# Patient Record
Sex: Male | Born: 1950
Health system: Southern US, Community
[De-identification: ages and names within clinical notes are randomized; demographics above are authoritative.]

## PROBLEM LIST (undated history)

## (undated) DIAGNOSIS — I779 Disorder of arteries and arterioles, unspecified: Secondary | ICD-10-CM

## (undated) DIAGNOSIS — E785 Hyperlipidemia, unspecified: Secondary | ICD-10-CM

## (undated) DIAGNOSIS — I2510 Atherosclerotic heart disease of native coronary artery without angina pectoris: Secondary | ICD-10-CM

## (undated) DIAGNOSIS — E119 Type 2 diabetes mellitus without complications: Secondary | ICD-10-CM

## (undated) DIAGNOSIS — K219 Gastro-esophageal reflux disease without esophagitis: Secondary | ICD-10-CM

## (undated) DIAGNOSIS — E669 Obesity, unspecified: Secondary | ICD-10-CM

## (undated) DIAGNOSIS — R55 Syncope and collapse: Secondary | ICD-10-CM

## (undated) DIAGNOSIS — I739 Peripheral vascular disease, unspecified: Secondary | ICD-10-CM

## (undated) DIAGNOSIS — R0789 Other chest pain: Secondary | ICD-10-CM

## (undated) DIAGNOSIS — I10 Essential (primary) hypertension: Secondary | ICD-10-CM

## (undated) HISTORY — PX: CAROTID ENDARTERECTOMY: SUR193

## (undated) HISTORY — PX: CORONARY ANGIOPLASTY WITH STENT PLACEMENT: SHX49

---

## 2012-06-22 DIAGNOSIS — I10 Essential (primary) hypertension: Secondary | ICD-10-CM | POA: Diagnosis present

## 2012-06-22 DIAGNOSIS — E785 Hyperlipidemia, unspecified: Secondary | ICD-10-CM | POA: Insufficient documentation

## 2012-06-22 DIAGNOSIS — I213 ST elevation (STEMI) myocardial infarction of unspecified site: Secondary | ICD-10-CM | POA: Insufficient documentation

## 2013-05-26 ENCOUNTER — Emergency Department (HOSPITAL_BASED_OUTPATIENT_CLINIC_OR_DEPARTMENT_OTHER)
Admission: EM | Admit: 2013-05-26 | Discharge: 2013-05-26 | Disposition: A | Discharge status: Home / Self Care | Payer: Non-veteran care | Attending: Emergency Medicine | Admitting: Emergency Medicine

## 2013-05-26 ENCOUNTER — Encounter (HOSPITAL_BASED_OUTPATIENT_CLINIC_OR_DEPARTMENT_OTHER): Payer: Self-pay | Admitting: Emergency Medicine

## 2013-05-26 DIAGNOSIS — R531 Weakness: Secondary | ICD-10-CM

## 2013-05-26 DIAGNOSIS — R51 Headache: Secondary | ICD-10-CM | POA: Insufficient documentation

## 2013-05-26 DIAGNOSIS — R5381 Other malaise: Secondary | ICD-10-CM | POA: Insufficient documentation

## 2013-05-26 DIAGNOSIS — Z7902 Long term (current) use of antithrombotics/antiplatelets: Secondary | ICD-10-CM | POA: Insufficient documentation

## 2013-05-26 DIAGNOSIS — Z9861 Coronary angioplasty status: Secondary | ICD-10-CM | POA: Insufficient documentation

## 2013-05-26 DIAGNOSIS — Z87891 Personal history of nicotine dependence: Secondary | ICD-10-CM | POA: Insufficient documentation

## 2013-05-26 DIAGNOSIS — I252 Old myocardial infarction: Secondary | ICD-10-CM | POA: Insufficient documentation

## 2013-05-26 DIAGNOSIS — Z79899 Other long term (current) drug therapy: Secondary | ICD-10-CM | POA: Insufficient documentation

## 2013-05-26 DIAGNOSIS — Z880 Allergy status to penicillin: Secondary | ICD-10-CM | POA: Insufficient documentation

## 2013-05-26 HISTORY — DX: Essential (primary) hypertension: I10

## 2013-05-26 LAB — CBC WITH DIFFERENTIAL/PLATELET
Basophils Relative: 1 % (ref 0–1)
Eosinophils Absolute: 0.5 10*3/uL (ref 0.0–0.7)
HCT: 44 % (ref 39.0–52.0)
Hemoglobin: 15 g/dL (ref 13.0–17.0)
Lymphocytes Relative: 29 % (ref 12–46)
MCHC: 34.1 g/dL (ref 30.0–36.0)
Monocytes Relative: 7 % (ref 3–12)
Neutro Abs: 4.4 10*3/uL (ref 1.7–7.7)
Neutrophils Relative %: 57 % (ref 43–77)
Platelets: 229 10*3/uL (ref 150–400)
RBC: 4.89 MIL/uL (ref 4.22–5.81)

## 2013-05-26 LAB — BASIC METABOLIC PANEL
BUN: 18 mg/dL (ref 6–23)
Calcium: 9.6 mg/dL (ref 8.4–10.5)
Chloride: 101 mEq/L (ref 96–112)
GFR calc Af Amer: >90 mL/min (ref >90)
GFR calc non Af Amer: 78 mL/min — ABNORMAL LOW (ref >90)
Potassium: 4.3 mEq/L (ref 3.5–5.1)
Sodium: 140 mEq/L (ref 135–145)

## 2013-05-26 NOTE — ED Provider Notes (Addendum)
CSN: [id number]     Arrival date & time 05/26/13  1842 History  This chart was scribed for Doug Sou, MD by Blanchard Kelch, ED Scribe. The patient was seen in room MH05/MH05. Patient's care was started at 8:48 PM.      Chief Complaint  Patient presents with  . Hypertension   ) Patient is a 62 y.o. male presenting with hypertension. The history is provided by the patient. No language interpreter was used.  Hypertension This is a new problem. The current episode started 3 to 5 hours ago. The problem occurs constantly. The problem has been gradually improving. Associated symptoms include headaches. Pertinent negatives include no chest pain and no shortness of breath. The symptoms are relieved by ASA. He has tried ASA for the symptoms. The treatment provided significant relief.    HPI Comments: Demitrus Francisco is a 62 y.o. male who presents to the Emergency Department complaining of light-headedneess four hours ago. The sensation lasted about an hour. He had a mild headache that has subsided. He checked his blood pressure after he felt the sensation and it was 170/70, then five minutes later it was 196/96, then the third time twenty minutes later was 175/72.  He reports taking an aspirin with relief of symptoms. He had a heart catheterization a year ago due to an MI and had two stents placed. He was told by his surgeon to come to ED for any symptoms. He denies shortness of breath,   chest pain. No visual changes no abdominal pain he is presently asymptomatic and feels well He is currently on Metoprolol and Lisinopril. He is scheduled for a stress test in January but has been doing well according to his cardiologist who he saw a month and a half ago. He is a former smoker, quit a year ago. He denies drinking alcohol for six months or any illegal drug use.   Past Medical History  Diagnosis Date  . Hypertension    Past Surgical History  Procedure Laterality Date  . Coronary angioplasty with stent  placement    . Carotid endarterectomy     No family history on file. History  Substance Use Topics  . Smoking status: Former Games developer  . Smokeless tobacco: Not on file  . Alcohol Use: No    Review of Systems  Constitutional: Negative.   Respiratory: Negative.  Negative for shortness of breath.   Cardiovascular: Negative.  Negative for chest pain.  Gastrointestinal: Negative.   Musculoskeletal: Negative.   Skin: Negative.   Neurological: Positive for light-headedness and headaches.  Psychiatric/Behavioral: Negative.   All other systems reviewed and are negative.    Allergies  Penicillins  Home Medications   Current Outpatient Rx  Name  Route  Sig  Dispense  Refill  . CALCIUM CITRATE PO   Oral   Take by mouth.         . Clopidogrel Bisulfate (PLAVIX PO)   Oral   Take by mouth.         . Esomeprazole Magnesium (NEXIUM PO)   Oral   Take by mouth.         Marland Kitchen LISINOPRIL PO   Oral   Take by mouth.         . METOPROLOL TARTRATE PO   Oral   Take by mouth.          Triage Vitals: BP 155/73  Pulse 75  Temp(Src) 98.6 F (37 C) (Oral)  Resp 20  Ht 5\' 9"  (1.753 m)  Wt 209 lb (94.802 kg)  BMI 30.85 kg/m2  SpO2 100%  Physical Exam  Nursing note and vitals reviewed. Constitutional: He is oriented to person, place, and time. He appears well-developed and well-nourished.  HENT:  Head: Normocephalic and atraumatic.  Eyes: Conjunctivae are normal. Pupils are equal, round, and reactive to light.  Neck: Neck supple. No tracheal deviation present. No thyromegaly present.  Cardiovascular: Normal rate and regular rhythm.   No murmur heard. Pulmonary/Chest: Effort normal and breath sounds normal.  Abdominal: Soft. Bowel sounds are normal. He exhibits no distension. There is no tenderness.  Musculoskeletal: Normal range of motion. He exhibits no edema and no tenderness.  Neurological: He is alert and oriented to person, place, and time. He has normal reflexes. No  cranial nerve deficit. Coordination normal.  Skin: Skin is warm and dry. No rash noted.  Psychiatric: He has a normal mood and affect.    ED Course  Procedures (including critical care time)  DIAGNOSTIC STUDIES: Oxygen Saturation is 100% on room air, normal by my interpretation.    COORDINATION OF CARE: 9:05 PM -Will order BMP, CBC. Patient verbalizes understanding and agrees with treatment plan.    Labs Review Labs Reviewed  BASIC METABOLIC PANEL - Abnormal; Notable for the following:    Glucose, Bld 114 (*)    GFR calc non Af Amer 78 (*)    All other components within normal limits  CBC WITH DIFFERENTIAL - Abnormal; Notable for the following:    Eosinophils Relative 6 (*)    All other components within normal limits   Imaging Review No results found.  EKG Interpretation   None      10:05 PM patient asymptomatic Results for orders placed during the hospital encounter of 05/26/13  BASIC METABOLIC PANEL      Result Value Range   Sodium 140  135 - 145 mEq/L   Potassium 4.3  3.5 - 5.1 mEq/L   Chloride 101  96 - 112 mEq/L   CO2 29  19 - 32 mEq/L   Glucose, Bld 114 (*) 70 - 99 mg/dL   BUN 18  6 - 23 mg/dL   Creatinine, Ser 1.61  0.50 - 1.35 mg/dL   Calcium 9.6  8.4 - 09.6 mg/dL   GFR calc non Af Amer 78 (*) >90 mL/min   GFR calc Af Amer >90  >90 mL/min  CBC WITH DIFFERENTIAL      Result Value Range   WBC 7.7  4.0 - 10.5 K/uL   RBC 4.89  4.22 - 5.81 MIL/uL   Hemoglobin 15.0  13.0 - 17.0 g/dL   HCT 04.5  40.9 - 81.1 %   MCV 90.0  78.0 - 100.0 fL   MCH 30.7  26.0 - 34.0 pg   MCHC 34.1  30.0 - 36.0 g/dL   RDW 91.4  78.2 - 95.6 %   Platelets 229  150 - 400 K/uL   Neutrophils Relative % 57  43 - 77 %   Neutro Abs 4.4  1.7 - 7.7 K/uL   Lymphocytes Relative 29  12 - 46 %   Lymphs Abs 2.2  0.7 - 4.0 K/uL   Monocytes Relative 7  3 - 12 %   Monocytes Absolute 0.5  0.1 - 1.0 K/uL   Eosinophils Relative 6 (*) 0 - 5 %   Eosinophils Absolute 0.5  0.0 - 0.7 K/uL    Basophils Relative 1  0 - 1 %   Basophils Absolute 0.1  0.0 -  0.1 K/uL   No results found.  Date: 05/26/2013  Rate: 75  Rhythm: normal sinus rhythm  QRS Axis: normal  Intervals: normal  ST/T Wave abnormalities: normal  Conduction Disutrbances: none  Narrative Interpretation: unremarkable    MDM  No diagnosis found. Plan followup with primary care physician Diagnosis #1 weakness 2 hypertension   I personally performed the services described in this documentation, which was scribed in my presence. The recorded information has been reviewed and considered.   Doug Sou, MD 05/26/13 1610  Doug Sou, MD 05/26/13 [phone]

## 2013-05-26 NOTE — ED Notes (Addendum)
Was feeling light head, ,  Checked bp  Was elvelated  So came to ed   States feels fine now denies being light headed

## 2013-05-26 NOTE — ED Notes (Signed)
C/o HTN, lightheaded, dizzy-started approx 1730

## 2014-06-05 ENCOUNTER — Emergency Department (HOSPITAL_BASED_OUTPATIENT_CLINIC_OR_DEPARTMENT_OTHER): Payer: Non-veteran care

## 2014-06-05 ENCOUNTER — Encounter (HOSPITAL_BASED_OUTPATIENT_CLINIC_OR_DEPARTMENT_OTHER): Payer: Self-pay | Admitting: Emergency Medicine

## 2014-06-05 ENCOUNTER — Inpatient Hospital Stay (HOSPITAL_BASED_OUTPATIENT_CLINIC_OR_DEPARTMENT_OTHER)
Admission: EM | Admit: 2014-06-05 | Discharge: 2014-06-09 | DRG: 247 | Disposition: A | Discharge status: Home / Self Care | Payer: Non-veteran care | Attending: Internal Medicine | Admitting: Internal Medicine

## 2014-06-05 DIAGNOSIS — I252 Old myocardial infarction: Secondary | ICD-10-CM

## 2014-06-05 DIAGNOSIS — Z7902 Long term (current) use of antithrombotics/antiplatelets: Secondary | ICD-10-CM

## 2014-06-05 DIAGNOSIS — R079 Chest pain, unspecified: Secondary | ICD-10-CM | POA: Diagnosis present

## 2014-06-05 DIAGNOSIS — T82857A Stenosis of cardiac prosthetic devices, implants and grafts, initial encounter: Secondary | ICD-10-CM | POA: Diagnosis present

## 2014-06-05 DIAGNOSIS — I10 Essential (primary) hypertension: Secondary | ICD-10-CM | POA: Diagnosis present

## 2014-06-05 DIAGNOSIS — E785 Hyperlipidemia, unspecified: Secondary | ICD-10-CM | POA: Diagnosis present

## 2014-06-05 DIAGNOSIS — Z955 Presence of coronary angioplasty implant and graft: Secondary | ICD-10-CM

## 2014-06-05 DIAGNOSIS — E669 Obesity, unspecified: Secondary | ICD-10-CM | POA: Diagnosis present

## 2014-06-05 DIAGNOSIS — I25119 Atherosclerotic heart disease of native coronary artery with unspecified angina pectoris: Secondary | ICD-10-CM

## 2014-06-05 DIAGNOSIS — R0789 Other chest pain: Secondary | ICD-10-CM

## 2014-06-05 DIAGNOSIS — I2510 Atherosclerotic heart disease of native coronary artery without angina pectoris: Principal | ICD-10-CM | POA: Diagnosis present

## 2014-06-05 DIAGNOSIS — Y848 Other medical procedures as the cause of abnormal reaction of the patient, or of later complication, without mention of misadventure at the time of the procedure: Secondary | ICD-10-CM | POA: Diagnosis present

## 2014-06-05 DIAGNOSIS — K219 Gastro-esophageal reflux disease without esophagitis: Secondary | ICD-10-CM | POA: Diagnosis present

## 2014-06-05 DIAGNOSIS — Z7982 Long term (current) use of aspirin: Secondary | ICD-10-CM

## 2014-06-05 DIAGNOSIS — Z87891 Personal history of nicotine dependence: Secondary | ICD-10-CM

## 2014-06-05 DIAGNOSIS — Z79899 Other long term (current) drug therapy: Secondary | ICD-10-CM

## 2014-06-05 HISTORY — DX: Atherosclerotic heart disease of native coronary artery without angina pectoris: I25.10

## 2014-06-05 HISTORY — DX: Gastro-esophageal reflux disease without esophagitis: K21.9

## 2014-06-05 HISTORY — DX: Obesity, unspecified: E66.9

## 2014-06-05 MED ORDER — ASPIRIN 81 MG PO CHEW
324.0000 mg | CHEWABLE_TABLET | Freq: Once | ORAL | Status: DC
  Filled 2014-06-05: qty 4

## 2014-06-05 NOTE — ED Provider Notes (Signed)
CSN: [id number]     Arrival date & time 06/05/14  2229 History  This chart was scribed for Julianne Rice, MD by Jeanell Sparrow, ED Scribe. This patient was seen in room MH11/MH11 and the patient's care was started at 11:15 PM.   Chief Complaint  Patient presents with  . Chest Pain   The history is provided by the patient. No language interpreter was used.   HPI Comments: Reginald Little is a 63 y.o. male who presents to the Emergency Department complaining of moderate intermittent chest pain that started a few days ago. He reports that his heart stents "may be acting up", but his blood pressure has been normal. He states that about an hour ago, he started feeling lightheaded, feverish, and had a headache. He reports that he measured his blood pressure and it was 192/92 and came to the ED. Pt's blood pressure in the ED is 203/98. He reports no modifying factors for the pain. He denies any SOB, nausea, or leg swelling.   Past Medical History  Diagnosis Date  . Hypertension   . Obesity   . High cholesterol   . Coronary artery disease   . GERD (gastroesophageal reflux disease)    Past Surgical History  Procedure Laterality Date  . Coronary angioplasty with stent placement    . Carotid endarterectomy     No family history on file. History  Substance Use Topics  . Smoking status: Former Smoker    Quit date: 06/05/2012  . Smokeless tobacco: Not on file  . Alcohol Use: No    Review of Systems  Cardiovascular: Positive for chest pain.  Gastrointestinal: Positive for nausea.  All other systems reviewed and are negative.   Allergies  Penicillins  Home Medications   Prior to Admission medications   Medication Sig Start Date End Date Taking? Authorizing Provider  atenolol (TENORMIN) 50 MG tablet Take 50 mg by mouth daily.   Yes Historical Provider, MD  losartan (COZAAR) 50 MG tablet Take 50 mg by mouth daily.   Yes Historical Provider, MD  CALCIUM CITRATE PO Take by mouth.     Historical Provider, MD  Clopidogrel Bisulfate (PLAVIX PO) Take by mouth.    Historical Provider, MD  Esomeprazole Magnesium (NEXIUM PO) Take by mouth.    Historical Provider, MD  LISINOPRIL PO Take by mouth.    Historical Provider, MD  METOPROLOL TARTRATE PO Take by mouth.    Historical Provider, MD   BP 203/98 mmHg  Pulse 84  Temp(Src) 98.2 F (36.8 C) (Oral)  Resp 16  Ht 5\' 8"  (1.727 m)  Wt 206 lb (93.441 kg)  BMI 31.33 kg/m2  SpO2 100% Physical Exam  Constitutional: He is oriented to person, place, and time. He appears well-developed and well-nourished. No distress.  HENT:  Head: Normocephalic and atraumatic.  Neck: Neck supple. No tracheal deviation present.  Cardiovascular: Normal rate.   Pulmonary/Chest: Effort normal. No respiratory distress.  Musculoskeletal: Normal range of motion.  Neurological: He is alert and oriented to person, place, and time.  Skin: Skin is warm and dry.  Psychiatric: He has a normal mood and affect. His behavior is normal.  Nursing note and vitals reviewed.   ED Course  Procedures (including critical care time) DIAGNOSTIC STUDIES: Oxygen Saturation is 100% on RA, normal by my interpretation.    COORDINATION OF CARE: 11:19 PM- Pt advised of plan for treatment and pt agrees.  Labs Review Labs Reviewed  CBC WITH DIFFERENTIAL - Abnormal; Notable for the  following:    Eosinophils Relative 7 (*)    All other components within normal limits  COMPREHENSIVE METABOLIC PANEL - Abnormal; Notable for the following:    Glucose, Bld 134 (*)    GFR calc non Af Amer 86 (*)    All other components within normal limits  TROPONIN I    Imaging Review Dg Chest 2 View  06/05/2014   CLINICAL DATA:  Moderate intermittent chest pain.  EXAM: CHEST  2 VIEW  COMPARISON:  None.  FINDINGS: The heart size and mediastinal contours are within normal limits. Both lungs are clear. The visualized skeletal structures are unremarkable.  IMPRESSION: No acute  cardiopulmonary process.   Electronically Signed   By: Suzy Bouchard M.D.   On: 06/05/2014 23:36     EKG Interpretation None      Date: 06/06/2014  Rate: 84  Rhythm: normal sinus rhythm  QRS Axis: normal  Intervals: normal  ST/T Wave abnormalities: nonspecific T wave changes  Conduction Disutrbances:none  Narrative Interpretation:   Old EKG Reviewed: unchanged   MDM   Final diagnoses:  Chest pain    I personally performed the services described in this documentation, which was scribed in my presence. The recorded information has been reviewed and is accurate.  Patient remained chest pain-free in the emergency department. Initial troponin is normal. Discussed with Dr. Posey Pronto. Will accept in transfer to a telemetry observation bed for rule out.    Julianne Rice, MD 06/06/14 [phone]

## 2014-06-05 NOTE — ED Notes (Signed)
Intermittent left sided CP since yesterday without radiation. Nausea. Elevated bp - has checked at home. Pt has ntg but "never think to take it". Pt unable to qualify his cp.  Sts it "doesn't really hurt, I just know its there".

## 2014-06-06 ENCOUNTER — Encounter (HOSPITAL_COMMUNITY): Payer: Self-pay | Admitting: Internal Medicine

## 2014-06-06 DIAGNOSIS — R079 Chest pain, unspecified: Secondary | ICD-10-CM | POA: Diagnosis not present

## 2014-06-06 DIAGNOSIS — I517 Cardiomegaly: Secondary | ICD-10-CM

## 2014-06-06 DIAGNOSIS — I2510 Atherosclerotic heart disease of native coronary artery without angina pectoris: Secondary | ICD-10-CM | POA: Diagnosis not present

## 2014-06-06 DIAGNOSIS — I209 Angina pectoris, unspecified: Secondary | ICD-10-CM | POA: Diagnosis not present

## 2014-06-06 LAB — CBC WITH DIFFERENTIAL/PLATELET
BASOS ABS: 0.1 10*3/uL (ref 0.0–0.1)
BASOS PCT: 1 % (ref 0–1)
Basophils Absolute: 0.1 10*3/uL (ref 0.0–0.1)
Basophils Relative: 1 % (ref 0–1)
EOS PCT: 7 % — AB (ref 0–5)
Eosinophils Absolute: 0.4 10*3/uL (ref 0.0–0.7)
Eosinophils Absolute: 0.4 10*3/uL (ref 0.0–0.7)
Eosinophils Relative: 7 % — ABNORMAL HIGH (ref 0–5)
HEMATOCRIT: 41.8 % (ref 39.0–52.0)
HEMATOCRIT: 42.4 % (ref 39.0–52.0)
Hemoglobin: 13.9 g/dL (ref 13.0–17.0)
Hemoglobin: 14.4 g/dL (ref 13.0–17.0)
LYMPHS ABS: 2.2 10*3/uL (ref 0.7–4.0)
Lymphocytes Relative: 33 % (ref 12–46)
Lymphocytes Relative: 36 % (ref 12–46)
Lymphs Abs: 1.9 10*3/uL (ref 0.7–4.0)
MCH: 29.8 pg (ref 26.0–34.0)
MCH: 30.9 pg (ref 26.0–34.0)
MCHC: 33.3 g/dL (ref 30.0–36.0)
MCHC: 34 g/dL (ref 30.0–36.0)
MCV: 89.7 fL (ref 78.0–100.0)
MCV: 91 fL (ref 78.0–100.0)
MONO ABS: 0.4 10*3/uL (ref 0.1–1.0)
MONO ABS: 0.6 10*3/uL (ref 0.1–1.0)
Monocytes Relative: 10 % (ref 3–12)
Monocytes Relative: 7 % (ref 3–12)
Neutro Abs: 2.9 10*3/uL (ref 1.7–7.7)
Neutro Abs: 3 10*3/uL (ref 1.7–7.7)
Neutrophils Relative %: 49 % (ref 43–77)
Neutrophils Relative %: 49 % (ref 43–77)
Platelets: 193 10*3/uL (ref 150–400)
Platelets: 205 10*3/uL (ref 150–400)
RBC: 4.66 MIL/uL (ref 4.22–5.81)
RBC: 4.66 MIL/uL (ref 4.22–5.81)
RDW: 12.7 % (ref 11.5–15.5)
RDW: 12.5 % (ref 11.5–15.5)
WBC: 5.9 10*3/uL (ref 4.0–10.5)
WBC: 6.2 10*3/uL (ref 4.0–10.5)

## 2014-06-06 LAB — TROPONIN I
Troponin I: <0.03 ng/mL (ref <0.031)
Troponin I: <0.03 ng/mL (ref <0.031)
Troponin I: <0.03 ng/mL (ref <0.031)

## 2014-06-06 LAB — COMPREHENSIVE METABOLIC PANEL
ALBUMIN: 4.1 g/dL (ref 3.5–5.2)
ALT: 48 U/L (ref 0–53)
ALT: 53 U/L (ref 0–53)
ANION GAP: 6 (ref 5–15)
AST: 29 U/L (ref 0–37)
AST: 29 U/L (ref 0–37)
Albumin: 3.5 g/dL (ref 3.5–5.2)
Alkaline Phosphatase: 69 U/L (ref 39–117)
Alkaline Phosphatase: 79 U/L (ref 39–117)
Anion gap: 6 (ref 5–15)
BUN: 21 mg/dL (ref 6–23)
BUN: 23 mg/dL (ref 6–23)
CALCIUM: 9 mg/dL (ref 8.4–10.5)
CO2: 28 mmol/L (ref 19–32)
CO2: 30 mmol/L (ref 19–32)
CREATININE: 0.9 mg/dL (ref 0.50–1.35)
CREATININE: 0.95 mg/dL (ref 0.50–1.35)
Calcium: 9.3 mg/dL (ref 8.4–10.5)
Chloride: 103 mEq/L (ref 96–112)
Chloride: 105 mEq/L (ref 96–112)
GFR calc Af Amer: >90 mL/min (ref >90)
GFR calc Af Amer: >90 mL/min (ref >90)
GFR calc non Af Amer: 86 mL/min — ABNORMAL LOW (ref >90)
GFR, EST NON AFRICAN AMERICAN: 88 mL/min — AB (ref >90)
Glucose, Bld: 134 mg/dL — ABNORMAL HIGH (ref 70–99)
Glucose, Bld: 134 mg/dL — ABNORMAL HIGH (ref 70–99)
Potassium: 3.9 mmol/L (ref 3.5–5.1)
Potassium: 4.3 mmol/L (ref 3.5–5.1)
Sodium: 139 mmol/L (ref 135–145)
Sodium: 139 mmol/L (ref 135–145)
TOTAL PROTEIN: 7.2 g/dL (ref 6.0–8.3)
Total Bilirubin: 0.7 mg/dL (ref 0.3–1.2)
Total Bilirubin: 0.8 mg/dL (ref 0.3–1.2)
Total Protein: 6.6 g/dL (ref 6.0–8.3)

## 2014-06-06 LAB — PROTIME-INR
INR: 1.04 (ref 0.00–1.49)
PROTHROMBIN TIME: 13.7 s (ref 11.6–15.2)

## 2014-06-06 MED ORDER — ACETAMINOPHEN 325 MG PO TABS: 650.0000 mg | ORAL_TABLET | ORAL | Status: DC | PRN

## 2014-06-06 MED ORDER — ONDANSETRON HCL 4 MG/2ML IJ SOLN: 4.0000 mg | Freq: Four times a day (QID) | INTRAMUSCULAR | Status: DC | PRN

## 2014-06-06 MED ORDER — ATENOLOL 50 MG PO TABS
50.0000 mg | ORAL_TABLET | Freq: Every day | ORAL | Status: DC
  Administered 2014-06-06 – 2014-06-09 (×4): 50 mg via ORAL
  Filled 2014-06-06: qty 2
  Filled 2014-06-06: qty 1
  Filled 2014-06-06 (×2): qty 2

## 2014-06-06 MED ORDER — ASPIRIN EC 81 MG PO TBEC
81.0000 mg | DELAYED_RELEASE_TABLET | Freq: Every day | ORAL | Status: DC
  Administered 2014-06-06 – 2014-06-07 (×2): 81 mg via ORAL
  Filled 2014-06-06 (×2): qty 1

## 2014-06-06 MED ORDER — ATORVASTATIN CALCIUM 20 MG PO TABS
20.0000 mg | ORAL_TABLET | Freq: Every day | ORAL | Status: DC
  Administered 2014-06-06 – 2014-06-08 (×3): 20 mg via ORAL
  Filled 2014-06-06 (×4): qty 1

## 2014-06-06 MED ORDER — LOSARTAN POTASSIUM 50 MG PO TABS
50.0000 mg | ORAL_TABLET | Freq: Every day | ORAL | Status: DC
  Administered 2014-06-06 – 2014-06-09 (×4): 50 mg via ORAL
  Filled 2014-06-06 (×4): qty 1

## 2014-06-06 MED ORDER — PANTOPRAZOLE SODIUM 40 MG PO TBEC
40.0000 mg | DELAYED_RELEASE_TABLET | Freq: Every day | ORAL | Status: DC
  Administered 2014-06-06 – 2014-06-09 (×4): 40 mg via ORAL
  Filled 2014-06-06 (×4): qty 1

## 2014-06-06 MED ORDER — POTASSIUM CHLORIDE CRYS ER 20 MEQ PO TBCR
20.0000 meq | EXTENDED_RELEASE_TABLET | Freq: Once | ORAL | Status: AC
  Administered 2014-06-06: 20 meq via ORAL
  Filled 2014-06-06: qty 2

## 2014-06-06 MED ORDER — CLOPIDOGREL BISULFATE 75 MG PO TABS
75.0000 mg | ORAL_TABLET | Freq: Every day | ORAL | Status: DC
  Administered 2014-06-06 – 2014-06-09 (×4): 75 mg via ORAL
  Filled 2014-06-06 (×4): qty 1

## 2014-06-06 MED ORDER — CO-ENZYME Q-10 50 MG PO CAPS: 100.0000 mg | ORAL_CAPSULE | Freq: Every day | ORAL | Status: DC

## 2014-06-06 MED ORDER — ENOXAPARIN SODIUM 40 MG/0.4ML ~~LOC~~ SOLN
40.0000 mg | Freq: Every day | SUBCUTANEOUS | Status: DC
  Administered 2014-06-06 – 2014-06-09 (×3): 40 mg via SUBCUTANEOUS
  Filled 2014-06-06 (×3): qty 0.4

## 2014-06-06 NOTE — Consult Note (Signed)
Reason for Consult: Chest Pain  Requesting Physician: Dr Waldron Labs  HPI: Mr. Reginald Little is a 63 year old mildly overweight Caucasian male with no children who works in the Engineer, mining and was admitted for chest pain/rule out myocardial infarction. He has a history of CAD status post stenting with 2 drug-eluting stents 2 years ago at [facility]. His risk factors include discontinued tobacco abuse at that time, due to hypertension and hyperlipidemia. He admits to dietary indiscretion last night having had him and came in hypertensive for some atypical chest pain. His enzymes were negative. His EKG showed no acute changes. There was a Q wave in lead 3. He is currently pain-free.   Problem List: Patient Active Problem List   Diagnosis Date Noted  . Chest pain 06/06/2014  . CAD (coronary artery disease) 06/06/2014    PMHx:  Past Medical History  Diagnosis Date  . Hypertension   . Obesity   . High cholesterol   . Coronary artery disease   . GERD (gastroesophageal reflux disease)   . H/O carotid endarterectomy    Past Surgical History  Procedure Laterality Date  . Coronary angioplasty with stent placement    . Carotid endarterectomy      FAMHx: No family history on file.  SOCHx:  reports that he quit smoking about 2 years ago. He does not have any smokeless tobacco history on file. He reports that he does not drink alcohol or use illicit drugs.  ALLERGIES: Allergies  Allergen Reactions  . Penicillins Rash    ROS: Pertinent items are noted in HPI.  HOME MEDICATIONS: Prescriptions prior to admission  Medication Sig Dispense Refill Last Dose  . aspirin EC 81 MG tablet Take 81 mg by mouth daily.     Marland Kitchen atenolol (TENORMIN) 50 MG tablet Take 50 mg by mouth daily.     Marland Kitchen co-enzyme Q-10 50 MG capsule Take 300 mg by mouth daily.     Marland Kitchen losartan (COZAAR) 50 MG tablet Take 50 mg by mouth daily.     Marland Kitchen CALCIUM CITRATE PO Take by mouth.     .  Clopidogrel Bisulfate (PLAVIX PO) Take by mouth.     . Esomeprazole Magnesium (NEXIUM PO) Take by mouth.     Marland Kitchen LISINOPRIL PO Take by mouth.     . METOPROLOL TARTRATE PO Take by mouth.       HOSPITAL MEDICATIONS: I have reviewed the patient's current medications.  VITALS: Blood pressure 140/96, pulse 77, temperature 98.2 F (36.8 C), temperature source Oral, resp. rate 20, height 5\' 8"  (1.727 m), weight 212 lb 14.4 oz (96.571 kg), SpO2 100 %.  INPUT/OUTPUT I/O last 3 completed shifts: In: -  Out: 400 [Urine:400]      PHYSICAL EXAM: General appearance: alert and no distress Neck: no adenopathy, no carotid bruit, no JVD, supple, symmetrical, trachea midline and thyroid not enlarged, symmetric, no tenderness/mass/nodules Lungs: clear to auscultation bilaterally Heart: regular rate and rhythm, S1, S2 normal, no murmur, click, rub or gallop Extremities: extremities normal, atraumatic, no cyanosis or edema  LABS:  BMP  Recent Labs  06/05/14 2359 06/06/14 0802  NA 139 139  K 4.3 3.9  CL 103 105  CO2 30 28  GLUCOSE 134* 134*  BUN 23 21  CREATININE 0.95 0.90  CALCIUM 9.3 9.0  GFRNONAA 86* 88*  GFRAA >90 >90    CBC  Recent Labs Lab 06/06/14 0802  WBC 6.2  RBC 4.66  HGB 13.9  HCT 41.8  PLT 205  MCV 89.7    HEMOGLOBIN A1C No results found for: HGBA1C, MPG  Cardiac Panel (last 3 results)  Recent Labs  06/05/14 2359 06/06/14 0802  TROPONINI <0.03 <0.03    BNP (last 3 results) No results for input(s): PROBNP in the last 8760 hours.  TSH No results for input(s): TSH in the last 8760 hours.  CHOLESTEROL No results for input(s): CHOL in the last 8760 hours.  Hepatic Function Panel  Recent Labs  06/05/14 2359 06/06/14 0802  PROT 7.2 6.6  ALBUMIN 4.1 3.5  AST 29 29  ALT 53 48  ALKPHOS 79 69  BILITOT 0.8 0.7    IMAGING: Dg Chest 2 View  06/05/2014   CLINICAL DATA:  Moderate intermittent chest pain.  EXAM: CHEST  2 VIEW  COMPARISON:  None.   FINDINGS: The heart size and mediastinal contours are within normal limits. Both lungs are clear. The visualized skeletal structures are unremarkable.  IMPRESSION: No acute cardiopulmonary process.   Electronically Signed   By: Suzy Bouchard M.D.   On: 06/05/2014 23:36   Tele: NSR   IMPRESSION: 1. Coronary artery disease-history of CAD status post stenting 2 years ago at [facility] with 2 drug eluting stents. The pain sounds atypical. Enzymes are negative. There are no acute EKG changes. 2. Hypertension-can titrate his ARB 3. Hyperlipidemia-start statin therapy   RECOMMENDATION: 1. We will obtain a Lexiscan Myoview stress test tomorrow morning to risk stratify him. If negative he can be discharged home after that.  Time Spent Directly with Patient: 30 minutes  Reginald Little J 06/06/2014, 12:31 PM

## 2014-06-06 NOTE — Progress Notes (Signed)
Patient Demographics  Reginald Little, is a 63 y.o. male, DOB - 04/04/1951, ECX:[id number]  Admit date - 06/05/2014   Admitting Physician Berle Mull, MD  Outpatient Primary MD for the patient is Pcp Not In System  LOS - 1   Chief Complaint  Patient presents with  . Chest Pain      Admission history of present illness/brief narrative: Reginald Little is a 63 y.o. male with Past medical history of coronary artery disease status post PCI to RCA with 2 stents in January 14 after a STEMI, carotid endarterectomy. Presents with complaints of left-sided chest pain which was sharp lasting for a few seconds and occurring on exertion and resolving on its own. This has been ongoing for last to 3 days with increasing frequency. He occasionally has shortness of breath with them. Most recent chest pain was accompanied with elevated systolic blood pressure in 190s ,therefore he decided to come to the hospital. Cardiac enzymes were negative 3, has been seen by cardiology, plan is for nuclear stress test in a.m..  Subjective:   Reginald Little today has, No headache, No chest pain, No abdominal pain - No Nausea, No new weakness tingling or numbness, No Cough - SOB.   Assessment & Plan    Principal Problem:   Chest pain Active Problems:   CAD (coronary artery disease)  Chest pain - Cardiology consult appreciated, continue to monitor on telemetry, so far cardiac enzymes negative 3, plan is for Myoview stress test in a.m. to risk stratify patient.  Hypertension -Continue with atenolol, losartan( will hold on titrating losartan given most recent blood pressure 107/53)  Coronary artery disease -Continue with aspirin, beta blockers, Plavix, losartan  Hyperlipidemia -Start on Lipitor 20 mg oral at bedtime  Code Status: Full  Family Communication: None at bedside  Disposition Plan:  Home if nuclear stress test is negative   Procedures None  Consults   Cardiology   Medications  Scheduled Meds: . aspirin  324 mg Oral Once  . aspirin EC  81 mg Oral Daily  . atenolol  50 mg Oral Daily  . clopidogrel  75 mg Oral Daily  . enoxaparin (LOVENOX) injection  40 mg Subcutaneous Daily  . losartan  50 mg Oral Daily  . pantoprazole  40 mg Oral Daily  . potassium chloride  20 mEq Oral Once   Continuous Infusions:  PRN Meds:.acetaminophen, ondansetron (ZOFRAN) IV  DVT Prophylaxis Lovenox  Lab Results  Component Value Date   PLT 205 06/06/2014    Antibiotics   Anti-infectives    None          Objective:   Filed Vitals:   06/06/14 0249 06/06/14 0407 06/06/14 1100 06/06/14 1352  BP:  148/81 140/96 107/53  Pulse: 86 77  61  Temp: 98.1 F (36.7 C) 98.2 F (36.8 C)  98.4 F (36.9 C)  TempSrc: Oral Oral  Oral  Resp: 18 20  17   Height:  5\' 8"  (1.727 m)    Weight:  96.571 kg (212 lb 14.4 oz)    SpO2: 96% 100%  98%    Wt Readings from Last 3 Encounters:  06/06/14 96.571 kg (212 lb 14.4 oz)  05/26/13 94.802 kg (209 lb)     Intake/Output Summary (  Last 24 hours) at 06/06/14 1437 Last data filed at 06/06/14 1345  Gross per 24 hour  Intake    240 ml  Output    400 ml  Net   -160 ml     Physical Exam  Awake Alert, Oriented X 3, No new F.N deficits, Normal affect [place].AT,PERRAL Supple Neck,No JVD, No cervical lymphadenopathy appriciated.  Symmetrical Chest wall movement, Good air movement bilaterally, CTAB RRR,No Gallops,Rubs or new Murmurs, No Parasternal Heave +ve B.Sounds, Abd Soft, No tenderness, No organomegaly appriciated, No rebound - guarding or rigidity. No Cyanosis, Clubbing or edema, No new Rash or bruise     Data Review   Micro Results No results found for this or any previous visit (from the past 240 hour(s)).  Radiology Reports Dg Chest 2 View  06/05/2014   CLINICAL DATA:  Moderate intermittent chest pain.  EXAM: CHEST  2  VIEW  COMPARISON:  None.  FINDINGS: The heart size and mediastinal contours are within normal limits. Both lungs are clear. The visualized skeletal structures are unremarkable.  IMPRESSION: No acute cardiopulmonary process.   Electronically Signed   By: Suzy Bouchard M.D.   On: 06/05/2014 23:36    CBC  Recent Labs Lab 06/05/14 2359 06/06/14 0802  WBC 5.9 6.2  HGB 14.4 13.9  HCT 42.4 41.8  PLT 193 205  MCV 91.0 89.7  MCH 30.9 29.8  MCHC 34.0 33.3  RDW 12.5 12.7  LYMPHSABS 1.9 2.2  MONOABS 0.6 0.4  EOSABS 0.4 0.4  BASOSABS 0.1 0.1    Chemistries   Recent Labs Lab 06/05/14 2359 06/06/14 0802  NA 139 139  K 4.3 3.9  CL 103 105  CO2 30 28  GLUCOSE 134* 134*  BUN 23 21  CREATININE 0.95 0.90  CALCIUM 9.3 9.0  AST 29 29  ALT 53 48  ALKPHOS 79 69  BILITOT 0.8 0.7   ------------------------------------------------------------------------------------------------------------------ estimated creatinine clearance is 93.5 mL/min (by C-G formula based on Cr of 0.9). ------------------------------------------------------------------------------------------------------------------ No results for input(s): HGBA1C in the last 72 hours. ------------------------------------------------------------------------------------------------------------------ No results for input(s): CHOL, HDL, LDLCALC, TRIG, CHOLHDL, LDLDIRECT in the last 72 hours. ------------------------------------------------------------------------------------------------------------------ No results for input(s): TSH, T4TOTAL, T3FREE, THYROIDAB in the last 72 hours.  Invalid input(s): FREET3 ------------------------------------------------------------------------------------------------------------------ No results for input(s): VITAMINB12, FOLATE, FERRITIN, TIBC, IRON, RETICCTPCT in the last 72 hours.  Coagulation profile  Recent Labs Lab 06/06/14 0802  INR 1.04    No results for input(s): DDIMER in the  last 72 hours.  Cardiac Enzymes  Recent Labs Lab 06/05/14 2359 06/06/14 0802 06/06/14 1217  TROPONINI <0.03 <0.03 <0.03   ------------------------------------------------------------------------------------------------------------------ Invalid input(s): POCBNP     Time Spent in minutes   30 minutes   Orlandus Borowski M.D on 06/06/2014 at 2:37 PM  Between 7am to 7pm - Pager - [phone]  After 7pm go to [email or web address] - password [email or web address]  And look for the night coverage person covering for me after hours  [facility] Office  [phone]   **Disclaimer: This note may have been dictated with voice recognition software. Similar sounding words can inadvertently be transcribed and this note may contain transcription errors which may not have been corrected upon publication of note.**

## 2014-06-06 NOTE — H&P (Signed)
[facility] History and Physical  Patient: Reginald Little  URK:[id number]  DOB: Oct 17, 1950  DOS: the patient was seen and examined on 06/06/2014 PCP: Pcp Not In System  Chief Complaint: Chest pain  HPI: Reginald Little is a 63 y.o. male with Past medical history of coronary artery disease status post PCI to RCA with 2 stents in January 14 after a STEMI, carotid endarterectomy. The patient presented with complaints of left-sided chest pain which was sharp lasting for a few seconds and occurring on exertion and resolving on its own. This has been ongoing for last to 3 days with increasing frequency. He occasionally has shortness of breath with them. Last night at 10:30 when he was at home he suddenly started having a sense of dizziness and also started having diaphoresis. Along with that he has chest pain and some shortness of breath which resolved within a few seconds but diaphoresis persisted and he checked his blood pressure and it was elevated in 831D systolic and therefore he decided to come to the urgent care. He does not have any chest pain since his arrival to the hospital. Denies any recent travel mentions he is taking all his medications regularly. Mentions he is trying to lose weight and watch his diet.  The patient is coming from home. And at his baseline independent for most of his ADL.  Review of Systems: as mentioned in the history of present illness.  A Comprehensive review of the other systems is negative.  Past Medical History  Diagnosis Date  . Hypertension   . Obesity   . High cholesterol   . Coronary artery disease   . GERD (gastroesophageal reflux disease)   . H/O carotid endarterectomy    Past Surgical History  Procedure Laterality Date  . Coronary angioplasty with stent placement    . Carotid endarterectomy     Social History:  reports that he quit smoking about 2 years ago. He does not have any smokeless tobacco history on file. He reports that he does not  drink alcohol or use illicit drugs.  Allergies  Allergen Reactions  . Penicillins Rash    No family history on file.  Prior to Admission medications   Medication Sig Start Date End Date Taking? Authorizing Provider  aspirin EC 81 MG tablet Take 81 mg by mouth daily.   Yes Historical Provider, MD  atenolol (TENORMIN) 50 MG tablet Take 50 mg by mouth daily.   Yes Historical Provider, MD  co-enzyme Q-10 50 MG capsule Take 300 mg by mouth daily.   Yes Historical Provider, MD  losartan (COZAAR) 50 MG tablet Take 50 mg by mouth daily.   Yes Historical Provider, MD  CALCIUM CITRATE PO Take by mouth.    Historical Provider, MD  Clopidogrel Bisulfate (PLAVIX PO) Take by mouth.    Historical Provider, MD  Esomeprazole Magnesium (NEXIUM PO) Take by mouth.    Historical Provider, MD  LISINOPRIL PO Take by mouth.    Historical Provider, MD  METOPROLOL TARTRATE PO Take by mouth.    Historical Provider, MD    Physical Exam: Filed Vitals:   06/06/14 0200 06/06/14 0230 06/06/14 0249 06/06/14 0407  BP: 142/95 173/78  148/81  Pulse: 80 78 86 77  Temp:   98.1 F (36.7 C) 98.2 F (36.8 C)  TempSrc:   Oral Oral  Resp:  20 18 20   Height:    5\' 8"  (1.727 m)  Weight:    96.571 kg (212 lb 14.4 oz)  SpO2:  100% 100% 96% 100%    General: Alert, Awake and Oriented to Time, Place and Person. Appear in mild distress Eyes: PERRL ENT: Oral Mucosa clear moist. Neck: no JVD Cardiovascular: S1 and S2 Present, no Murmur, Peripheral Pulses Present Respiratory: Bilateral Air entry equal and Decreased, Clear to Auscultation, noCrackles, no wheezes Abdomen: Bowel Sound present, Soft and non tender Skin: no Rash Extremities: no Pedal edema, no calf tenderness Neurologic: Grossly no focal neuro deficit.  Labs on Admission:  CBC:  Recent Labs Lab 06/05/14 2359  WBC 5.9  NEUTROABS 2.9  HGB 14.4  HCT 42.4  MCV 91.0  PLT 193    CMP     Component Value Date/Time   NA 139 06/05/2014 2359   K 4.3  06/05/2014 2359   CL 103 06/05/2014 2359   CO2 30 06/05/2014 2359   GLUCOSE 134* 06/05/2014 2359   BUN 23 06/05/2014 2359   CREATININE 0.95 06/05/2014 2359   CALCIUM 9.3 06/05/2014 2359   PROT 7.2 06/05/2014 2359   ALBUMIN 4.1 06/05/2014 2359   AST 29 06/05/2014 2359   ALT 53 06/05/2014 2359   ALKPHOS 79 06/05/2014 2359   BILITOT 0.8 06/05/2014 2359   GFRNONAA 86* 06/05/2014 2359   GFRAA >90 06/05/2014 2359    No results for input(s): LIPASE, AMYLASE in the last 168 hours. No results for input(s): AMMONIA in the last 168 hours.   Recent Labs Lab 06/05/14 2359  TROPONINI <0.03   BNP (last 3 results) No results for input(s): PROBNP in the last 8760 hours.  Radiological Exams on Admission: Dg Chest 2 View  06/05/2014   CLINICAL DATA:  Moderate intermittent chest pain.  EXAM: CHEST  2 VIEW  COMPARISON:  None.  FINDINGS: The heart size and mediastinal contours are within normal limits. Both lungs are clear. The visualized skeletal structures are unremarkable.  IMPRESSION: No acute cardiopulmonary process.   Electronically Signed   By: Suzy Bouchard M.D.   On: 06/05/2014 23:36    EKG: Independently reviewed. normal sinus rhythm, with inferior T-wave inversions unchanged.  Assessment/Plan Principal Problem:   Chest pain Active Problems:   CAD (coronary artery disease)   1. Chest pain Patient presents with complaints of chest pain. At present he is chest pain-free. His EKG does not show any acute changes. Initial troponin is negative. Does not have any risk factor for PE. At present due to his prior cardiac history he will be admitted in the hospital on telemetry and we will consider troponins, echocardiogram. He will remain nothing by mouth except medication. Continue aspirin and Plavix.  Advance goals of care discussion: Full code   DVT Prophylaxis: subcutaneous Heparin Nutrition: Nothing by mouth  Disposition: Admitted to observation in telemetry  unit.  Author: Berle Mull, MD [facility] Pager: [phone] 06/06/2014, 7:30 AM    If 7PM-7AM, please contact night-coverage [email or web address] Password [email or web address]

## 2014-06-06 NOTE — Progress Notes (Signed)
UR completed 

## 2014-06-06 NOTE — Progress Notes (Signed)
*  PRELIMINARY RESULTS* Echocardiogram 2D Echocardiogram has been performed.  Reginald Little 06/06/2014, 10:54 AM

## 2014-06-06 NOTE — Progress Notes (Signed)
PHARMACIST - PHYSICIAN ORDER COMMUNICATION  CONCERNING: P&T Medication Policy on Herbal Medications  DESCRIPTION:  This patient's order for:  Co Enzyme Q10  has been noted.  This product(s) is classified as an "herbal" or natural product. Due to a lack of definitive safety studies or FDA approval, nonstandard manufacturing practices, plus the potential risk of unknown drug-drug interactions while on inpatient medications, the Pharmacy and Therapeutics Committee does not permit the use of "herbal" or natural products of this type within [place].   ACTION TAKEN: The pharmacy department is unable to verify this order at this time and your patient has been informed of this safety policy. Please reevaluate patient's clinical condition at discharge and address if the herbal or natural product(s) should be resumed at that time.   

## 2014-06-07 ENCOUNTER — Observation Stay (HOSPITAL_COMMUNITY): Payer: Non-veteran care

## 2014-06-07 DIAGNOSIS — R079 Chest pain, unspecified: Secondary | ICD-10-CM

## 2014-06-07 DIAGNOSIS — I209 Angina pectoris, unspecified: Secondary | ICD-10-CM | POA: Diagnosis not present

## 2014-06-07 DIAGNOSIS — I2510 Atherosclerotic heart disease of native coronary artery without angina pectoris: Secondary | ICD-10-CM | POA: Diagnosis not present

## 2014-06-07 LAB — CBC
HCT: 40.2 % (ref 39.0–52.0)
Hemoglobin: 13.4 g/dL (ref 13.0–17.0)
MCH: 30 pg (ref 26.0–34.0)
MCHC: 33.3 g/dL (ref 30.0–36.0)
MCV: 90.1 fL (ref 78.0–100.0)
PLATELETS: 193 10*3/uL (ref 150–400)
RBC: 4.46 MIL/uL (ref 4.22–5.81)
RDW: 12.6 % (ref 11.5–15.5)
WBC: 5.8 10*3/uL (ref 4.0–10.5)

## 2014-06-07 LAB — TROPONIN I
Troponin I: <0.03 ng/mL (ref <0.031)
Troponin I: <0.03 ng/mL (ref <0.031)

## 2014-06-07 LAB — BASIC METABOLIC PANEL
ANION GAP: 6 (ref 5–15)
BUN: 18 mg/dL (ref 6–23)
CALCIUM: 9 mg/dL (ref 8.4–10.5)
CO2: 28 mmol/L (ref 19–32)
Chloride: 105 mEq/L (ref 96–112)
Creatinine, Ser: 0.94 mg/dL (ref 0.50–1.35)
GFR calc Af Amer: >90 mL/min (ref >90)
GFR, EST NON AFRICAN AMERICAN: 86 mL/min — AB (ref >90)
GLUCOSE: 118 mg/dL — AB (ref 70–99)
Potassium: 4.2 mmol/L (ref 3.5–5.1)
Sodium: 139 mmol/L (ref 135–145)

## 2014-06-07 MED ORDER — ASPIRIN 81 MG PO CHEW
81.0000 mg | CHEWABLE_TABLET | ORAL | Status: AC
  Administered 2014-06-08: 81 mg via ORAL
  Filled 2014-06-07: qty 1

## 2014-06-07 MED ORDER — TECHNETIUM TC 99M SESTAMIBI GENERIC - CARDIOLITE
10.0000 | Freq: Once | INTRAVENOUS | Status: AC | PRN
  Administered 2014-06-07: 10 via INTRAVENOUS

## 2014-06-07 MED ORDER — TECHNETIUM TC 99M SESTAMIBI - CARDIOLITE
30.0000 | Freq: Once | INTRAVENOUS | Status: AC | PRN
  Administered 2014-06-07: 30 via INTRAVENOUS

## 2014-06-07 MED ORDER — SODIUM CHLORIDE 0.9 % IV SOLN: 250.0000 mL | INTRAVENOUS | Status: DC | PRN

## 2014-06-07 MED ORDER — SODIUM CHLORIDE 0.9 % IJ SOLN
3.0000 mL | Freq: Two times a day (BID) | INTRAMUSCULAR | Status: DC
  Administered 2014-06-07 – 2014-06-08 (×2): 3 mL via INTRAVENOUS

## 2014-06-07 MED ORDER — REGADENOSON 0.4 MG/5ML IV SOLN
INTRAVENOUS | Status: AC
  Filled 2014-06-07: qty 5

## 2014-06-07 MED ORDER — SODIUM CHLORIDE 0.9 % IJ SOLN: 3.0000 mL | INTRAMUSCULAR | Status: DC | PRN

## 2014-06-07 MED ORDER — ASPIRIN EC 81 MG PO TBEC
81.0000 mg | DELAYED_RELEASE_TABLET | Freq: Every day | ORAL | Status: DC
  Administered 2014-06-09: 11:00:00 81 mg via ORAL
  Filled 2014-06-07: qty 1

## 2014-06-07 MED ORDER — REGADENOSON 0.4 MG/5ML IV SOLN
0.4000 mg | Freq: Once | INTRAVENOUS | Status: AC
  Administered 2014-06-07: 0.4 mg via INTRAVENOUS
  Filled 2014-06-07: qty 5

## 2014-06-07 NOTE — Progress Notes (Signed)
Subjective: No further chest pain   Objective: Vital signs in last 24 hours: Temp:  [97.9 F (36.6 C)-98.4 F (36.9 C)] 97.9 F (36.6 C) (12/27 0500) Pulse Rate:  [61-69] 66 (12/27 0500) Resp:  [11-17] 11 (12/27 0500) BP: (97-143)/(46-96) 143/72 mmHg (12/27 0851) SpO2:  [98 %-99 %] 99 % (12/27 0500) Weight:  [201 lb 1.6 oz (91.218 kg)] 201 lb 1.6 oz (91.218 kg) (12/27 0500) Weight change: -4 lb 14.4 oz (-2.223 kg) Last BM Date: 06/05/14 Intake/Output from previous day: +720 12/26 0701 - 12/27 0700 In: 720 [P.O.:720] Out: -  Intake/Output this shift:    PE: General:Pleasant affect, NAD Skin:Warm and dry, brisk capillary refill HEENT:normocephalic, sclera clear, mucus membranes moist Heart:S1S2 RRR without murmur, gallup, rub or click Lungs:clear without rales, rhonchi, or wheezes KGU:RKYH, non tender, + BS, do not palpate liver spleen or masses Ext:no lower ext edema, 2+ pedal pulses, 2+ radial pulses Neuro:alert and oriented, MAE, follows commands, + facial symmetry  tele: SR   Lab Results:  Recent Labs  06/06/14 0802 06/07/14 0100  WBC 6.2 5.8  HGB 13.9 13.4  HCT 41.8 40.2  PLT 205 193   BMET  Recent Labs  06/06/14 0802 06/07/14 0100  NA 139 139  K 3.9 4.2  CL 105 105  CO2 28 28  GLUCOSE 134* 118*  BUN 21 18  CREATININE 0.90 0.94  CALCIUM 9.0 9.0    Recent Labs  06/06/14 2010 06/07/14 0100  TROPONINI <0.03 <0.03    No results found for: CHOL, HDL, LDLCALC, LDLDIRECT, TRIG, CHOLHDL No results found for: HGBA1C   No results found for: TSH  Hepatic Function Panel  Recent Labs  06/06/14 0802  PROT 6.6  ALBUMIN 3.5  AST 29  ALT 48  ALKPHOS 69  BILITOT 0.7   No results for input(s): CHOL in the last 72 hours. No results for input(s): PROTIME in the last 72 hours.     Studies/Results: Dg Chest 2 View  06/05/2014   CLINICAL DATA:  Moderate intermittent chest pain.  EXAM: CHEST  2 VIEW  COMPARISON:  None.  FINDINGS:  The heart size and mediastinal contours are within normal limits. Both lungs are clear. The visualized skeletal structures are unremarkable.  IMPRESSION: No acute cardiopulmonary process.   Electronically Signed   By: Suzy Bouchard M.D.   On: 06/05/2014 23:36   Echo: Left ventricle: The cavity size was normal. Wall thickness was increased in a pattern of mild LVH. Systolic function was normal. The estimated ejection fraction was in the range of 55% to 60%. Wall motion was normal; there were no regional wall motion abnormalities. Doppler parameters are consistent with abnormal left ventricular relaxation (grade 1 diastolic dysfunction). Doppler parameters are consistent with high ventricular filling pressure. - Aortic root: The aortic root was mildly dilated. - Mitral valve: Calcified annulus. Impressions: - Normal LV function; grade 1 diastolic dysfuntion; mild LVH; mildly dilated aortic root.  Medications: I have reviewed the patient's current medications. Scheduled Meds: . aspirin  324 mg Oral Once  . aspirin EC  81 mg Oral Daily  . atenolol  50 mg Oral Daily  . atorvastatin  20 mg Oral q1800  . clopidogrel  75 mg Oral Daily  . enoxaparin (LOVENOX) injection  40 mg Subcutaneous Daily  . losartan  50 mg Oral Daily  . pantoprazole  40 mg Oral Daily  . regadenoson       Continuous Infusions:  PRN Meds:.acetaminophen, ondansetron (ZOFRAN) IV, technetium sestamibi   Assessment/Plan: Principal Problem:   Chest pain- neg MI.  For nuc study today.  If negative OK for discharge.  Will follow up with Dr. Adora Fridge once insurance ready.  Otherwise at current location. Active Problems:   CAD (coronary artery disease)    LOS: 2 days   Time spent with pt. :15 minutes. [facility] R  Nurse Practitioner Certified Pager [phone] or after 5pm and on weekends call [phone] 06/07/2014, 9:04 AM   Agree with note written by Cecilie Kicks RNP  No further CP. Exz neg.  Myoview showed mild-moderate inferior ischemia. Will arrange heart cath tomorrow.  Lorretta Harp 06/07/2014 12:29 PM

## 2014-06-07 NOTE — Progress Notes (Signed)
Patient Demographics  Reginald Little, is a 63 y.o. male, DOB - Mar 31, 1951, KWI:[id number]  Admit date - 06/05/2014   Admitting Physician Berle Mull, MD  Outpatient Primary MD for the patient is Pcp Not In System  LOS - 2   Chief Complaint  Patient presents with  . Chest Pain      Admission history of present illness/brief narrative: Reginald Little is a 63 y.o. male with Past medical history of coronary artery disease status post PCI to RCA with 2 stents in January 14 after a STEMI, carotid endarterectomy. Presents with complaints of left-sided chest pain which was sharp lasting for a few seconds and occurring on exertion and resolving on its own. This has been ongoing for last to 3 days with increasing frequency. He occasionally has shortness of breath with them. Most recent chest pain was accompanied with elevated systolic blood pressure in 190s ,therefore he decided to come to the hospital. Cardiac enzymes were negative 3, has been seen by cardiology,nuclear stress test showing mild-moderate inferior ischemia, for cardiac cath in am.  Subjective:   Reginald Little today has, No headache, No chest pain, No abdominal pain - No Nausea, No new weakness tingling or numbness, No Cough - SOB.   Assessment & Plan    Principal Problem:   Chest pain Active Problems:   CAD (coronary artery disease)  Chest pain - Cardiology consult appreciated, continue to monitor on telemetry, so far cardiac enzymes negative 3, the stress test showing mild to moderate inferior ischemia, for cardiac cath in a.m. Marland Kitchen  Hypertension -Continue with atenolol, losartan( will hold on titrating losartan given most recent blood pressure 107/53)  Coronary artery disease -Continue with aspirin, beta blockers, Plavix, losartan  Hyperlipidemia -Start on Lipitor 20 mg oral at bedtime  Code Status:  Full  Family Communication: None at bedside  Disposition Plan: Home if nuclear stress test is negative   Procedures None  Consults   Cardiology   Medications  Scheduled Meds: . aspirin  324 mg Oral Once  . aspirin EC  81 mg Oral Daily  . atenolol  50 mg Oral Daily  . atorvastatin  20 mg Oral q1800  . clopidogrel  75 mg Oral Daily  . enoxaparin (LOVENOX) injection  40 mg Subcutaneous Daily  . losartan  50 mg Oral Daily  . pantoprazole  40 mg Oral Daily  . regadenoson       Continuous Infusions:  PRN Meds:.acetaminophen, ondansetron (ZOFRAN) IV  DVT Prophylaxis Lovenox  Lab Results  Component Value Date   PLT 193 06/07/2014    Antibiotics   Anti-infectives    None          Objective:   Filed Vitals:   06/07/14 0914 06/07/14 0916 06/07/14 0918 06/07/14 0959  BP: 142/44 148/55 137/61 148/67  Pulse:    80  Temp:    98.4 F (36.9 C)  TempSrc:    Oral  Resp:    18  Height:      Weight:      SpO2:    99%    Wt Readings from Last 3 Encounters:  06/07/14 91.218 kg (201 lb 1.6 oz)  05/26/13 94.802 kg (209 lb)     Intake/Output Summary (Last 24  hours) at 06/07/14 1734 Last data filed at 06/07/14 0500  Gross per 24 hour  Intake    240 ml  Output      0 ml  Net    240 ml     Physical Exam  Awake Alert, Oriented X 3, No new F.N deficits, Normal affect [place].AT,PERRAL Supple Neck,No JVD, No cervical lymphadenopathy appriciated.  Symmetrical Chest wall movement, Good air movement bilaterally, CTAB RRR,No Gallops,Rubs or new Murmurs, No Parasternal Heave +ve B.Sounds, Abd Soft, No tenderness, No organomegaly appriciated, No rebound - guarding or rigidity. No Cyanosis, Clubbing or edema, No new Rash or bruise     Data Review   Micro Results No results found for this or any previous visit (from the past 240 hour(s)).  Radiology Reports Dg Chest 2 View  06/05/2014   CLINICAL DATA:  Moderate intermittent chest pain.  EXAM: CHEST  2 VIEW   COMPARISON:  None.  FINDINGS: The heart size and mediastinal contours are within normal limits. Both lungs are clear. The visualized skeletal structures are unremarkable.  IMPRESSION: No acute cardiopulmonary process.   Electronically Signed   By: Suzy Bouchard M.D.   On: 06/05/2014 23:36   Nm Myocar Multi W/spect W/wall Motion / Ef  06/07/2014   CLINICAL DATA:  Atypical chest pain, history hypertension, coronary artery disease post stenting, hypercholesterolemia, obesity, prior carotid endarterectomy, former smoker  EXAM: MYOCARDIAL IMAGING WITH SPECT (REST AND PHARMACOLOGIC-STRESS)  GATED LEFT VENTRICULAR WALL MOTION STUDY  LEFT VENTRICULAR EJECTION FRACTION  TECHNIQUE: Standard myocardial SPECT imaging was performed after resting intravenous injection of 10 mCi Tc-61m sestamibi. Subsequently, intravenous infusion of Lexiscan was performed under the supervision of the Cardiology staff. At peak effect of the drug, 30 mCi Tc-71m sestamibi was injected intravenously and standard myocardial SPECT imaging was performed. Quantitative gated imaging was also performed to evaluate left ventricular wall motion, and estimate left ventricular ejection fraction.  COMPARISON:  None.  FINDINGS: Perfusion: Large area of severe decreased myocardial perfusion on pharmacologic stress images involving the inferior and inferolateral walls of the LEFT ventricle. Resting exam demonstrates reperfusion of the inferolateral wall. Inferior wall defect shows no reversibility.  Wall Motion: Dyskinetic inferior wall LEFT ventricle. No LV dilatation.  Left Ventricular Ejection Fraction: 92% %  End diastolic volume 72 ml  End systolic volume 29 ml  IMPRESSION: 1. Mixed pattern of reversible and non reversible decreased myocardial perfusion involving the inferior and inferolateral walls of the LEFT ventricle as above.  2. Dyskinetic appearing inferior wall LEFT ventricle.  3. Left ventricular ejection fraction 59%%  4. Moderate-risk stress  test findings*.  *2012 Appropriate Use Criteria for Coronary Revascularization Focused Update: J Am Coll Cardiol. 1194;17(4):081-448. http://content.airportbarriers.com.aspx?articleid=1201161   Electronically Signed   By: Lavonia Dana M.D.   On: 06/07/2014 12:58    CBC  Recent Labs Lab 06/05/14 2359 06/06/14 0802 06/07/14 0100  WBC 5.9 6.2 5.8  HGB 14.4 13.9 13.4  HCT 42.4 41.8 40.2  PLT 193 205 193  MCV 91.0 89.7 90.1  MCH 30.9 29.8 30.0  MCHC 34.0 33.3 33.3  RDW 12.5 12.7 12.6  LYMPHSABS 1.9 2.2  --   MONOABS 0.6 0.4  --   EOSABS 0.4 0.4  --   BASOSABS 0.1 0.1  --     Chemistries   Recent Labs Lab 06/05/14 2359 06/06/14 0802 06/07/14 0100  NA 139 139 139  K 4.3 3.9 4.2  CL 103 105 105  CO2 30 28 28   GLUCOSE 134* 134*  118*  BUN 23 21 18   CREATININE 0.95 0.90 0.94  CALCIUM 9.3 9.0 9.0  AST 29 29  --   ALT 53 48  --   ALKPHOS 79 69  --   BILITOT 0.8 0.7  --    ------------------------------------------------------------------------------------------------------------------ estimated creatinine clearance is 87 mL/min (by C-G formula based on Cr of 0.94). ------------------------------------------------------------------------------------------------------------------ No results for input(s): HGBA1C in the last 72 hours. ------------------------------------------------------------------------------------------------------------------ No results for input(s): CHOL, HDL, LDLCALC, TRIG, CHOLHDL, LDLDIRECT in the last 72 hours. ------------------------------------------------------------------------------------------------------------------ No results for input(s): TSH, T4TOTAL, T3FREE, THYROIDAB in the last 72 hours.  Invalid input(s): FREET3 ------------------------------------------------------------------------------------------------------------------ No results for input(s): VITAMINB12, FOLATE, FERRITIN, TIBC, IRON, RETICCTPCT in the last 72  hours.  Coagulation profile  Recent Labs Lab 06/06/14 0802  INR 1.04    No results for input(s): DDIMER in the last 72 hours.  Cardiac Enzymes  Recent Labs Lab 06/06/14 2010 06/07/14 0100 06/07/14 1400  TROPONINI <0.03 <0.03 <0.03   ------------------------------------------------------------------------------------------------------------------ Invalid input(s): POCBNP     Time Spent in minutes   30 minutes   Reginald Little M.D on 06/07/2014 at 5:34 PM  Between 7am to 7pm - Pager - [phone]  After 7pm go to [email or web address] - password [email or web address]  And look for the night coverage person covering for me after hours  [facility] Office  [phone]   **Disclaimer: This note may have been dictated with voice recognition software. Similar sounding words can inadvertently be transcribed and this note may contain transcription errors which may not have been corrected upon publication of note.**

## 2014-06-07 NOTE — Progress Notes (Signed)
lexiscan completed without complications.  nuc results to follow.        Lorretta Harp, M.D., [place], [facility], [place] [address] [place], [place]  [address]  [phone] 06/07/2014 12:28 PM

## 2014-06-08 ENCOUNTER — Encounter (HOSPITAL_COMMUNITY): Payer: Self-pay | Admitting: Cardiovascular Disease

## 2014-06-08 ENCOUNTER — Encounter (HOSPITAL_COMMUNITY)
Admission: EM | Disposition: A | Discharge status: Home / Self Care | Payer: Non-veteran care | Source: Home / Self Care | Attending: Internal Medicine

## 2014-06-08 DIAGNOSIS — Z7982 Long term (current) use of aspirin: Secondary | ICD-10-CM | POA: Diagnosis not present

## 2014-06-08 DIAGNOSIS — Y848 Other medical procedures as the cause of abnormal reaction of the patient, or of later complication, without mention of misadventure at the time of the procedure: Secondary | ICD-10-CM | POA: Diagnosis present

## 2014-06-08 DIAGNOSIS — E785 Hyperlipidemia, unspecified: Secondary | ICD-10-CM

## 2014-06-08 DIAGNOSIS — T82857A Stenosis of cardiac prosthetic devices, implants and grafts, initial encounter: Secondary | ICD-10-CM | POA: Diagnosis present

## 2014-06-08 DIAGNOSIS — I2510 Atherosclerotic heart disease of native coronary artery without angina pectoris: Principal | ICD-10-CM

## 2014-06-08 DIAGNOSIS — Z87891 Personal history of nicotine dependence: Secondary | ICD-10-CM | POA: Diagnosis not present

## 2014-06-08 DIAGNOSIS — I252 Old myocardial infarction: Secondary | ICD-10-CM | POA: Diagnosis not present

## 2014-06-08 DIAGNOSIS — I2584 Coronary atherosclerosis due to calcified coronary lesion: Secondary | ICD-10-CM

## 2014-06-08 DIAGNOSIS — E669 Obesity, unspecified: Secondary | ICD-10-CM | POA: Diagnosis present

## 2014-06-08 DIAGNOSIS — I10 Essential (primary) hypertension: Secondary | ICD-10-CM | POA: Diagnosis present

## 2014-06-08 DIAGNOSIS — Z7902 Long term (current) use of antithrombotics/antiplatelets: Secondary | ICD-10-CM | POA: Diagnosis not present

## 2014-06-08 DIAGNOSIS — K219 Gastro-esophageal reflux disease without esophagitis: Secondary | ICD-10-CM | POA: Diagnosis present

## 2014-06-08 DIAGNOSIS — R079 Chest pain, unspecified: Secondary | ICD-10-CM | POA: Diagnosis present

## 2014-06-08 DIAGNOSIS — Z79899 Other long term (current) drug therapy: Secondary | ICD-10-CM | POA: Diagnosis not present

## 2014-06-08 HISTORY — PX: LEFT HEART CATHETERIZATION WITH CORONARY ANGIOGRAM: SHX5451

## 2014-06-08 LAB — BASIC METABOLIC PANEL
Anion gap: 6 (ref 5–15)
BUN: 17 mg/dL (ref 6–23)
CALCIUM: 8.7 mg/dL (ref 8.4–10.5)
CO2: 29 mmol/L (ref 19–32)
CREATININE: 0.93 mg/dL (ref 0.50–1.35)
Chloride: 104 mEq/L (ref 96–112)
GFR, EST NON AFRICAN AMERICAN: 87 mL/min — AB (ref >90)
Glucose, Bld: 209 mg/dL — ABNORMAL HIGH (ref 70–99)
Potassium: 4 mmol/L (ref 3.5–5.1)
Sodium: 139 mmol/L (ref 135–145)

## 2014-06-08 LAB — CBC
HCT: 38.8 % — ABNORMAL LOW (ref 39.0–52.0)
Hemoglobin: 12.7 g/dL — ABNORMAL LOW (ref 13.0–17.0)
MCH: 30.2 pg (ref 26.0–34.0)
MCHC: 32.7 g/dL (ref 30.0–36.0)
MCV: 92.4 fL (ref 78.0–100.0)
PLATELETS: 180 10*3/uL (ref 150–400)
RBC: 4.2 MIL/uL — ABNORMAL LOW (ref 4.22–5.81)
RDW: 12.7 % (ref 11.5–15.5)
WBC: 5.1 10*3/uL (ref 4.0–10.5)

## 2014-06-08 LAB — TROPONIN I

## 2014-06-08 SURGERY — LEFT HEART CATHETERIZATION WITH CORONARY ANGIOGRAM
Anesthesia: LOCAL

## 2014-06-08 MED ORDER — MIDAZOLAM HCL 2 MG/2ML IJ SOLN
INTRAMUSCULAR | Status: AC
Start: 2014-06-08 — End: 2014-06-08
  Filled 2014-06-08: qty 2

## 2014-06-08 MED ORDER — OXYCODONE-ACETAMINOPHEN 5-325 MG PO TABS: 1.0000 | ORAL_TABLET | ORAL | Status: DC | PRN

## 2014-06-08 MED ORDER — SODIUM CHLORIDE 0.9 % IJ SOLN: 3.0000 mL | Freq: Two times a day (BID) | INTRAMUSCULAR | Status: DC

## 2014-06-08 MED ORDER — NITROGLYCERIN 1 MG/10 ML FOR IR/CATH LAB
INTRA_ARTERIAL | Status: AC
  Filled 2014-06-08: qty 10

## 2014-06-08 MED ORDER — LIDOCAINE HCL (PF) 1 % IJ SOLN
INTRAMUSCULAR | Status: AC
  Filled 2014-06-08: qty 30

## 2014-06-08 MED ORDER — BIVALIRUDIN 250 MG IV SOLR
INTRAVENOUS | Status: AC
  Filled 2014-06-08: qty 250

## 2014-06-08 MED ORDER — HEPARIN SODIUM (PORCINE) 1000 UNIT/ML IJ SOLN
INTRAMUSCULAR | Status: AC
  Filled 2014-06-08: qty 1

## 2014-06-08 MED ORDER — FENTANYL CITRATE 0.05 MG/ML IJ SOLN
INTRAMUSCULAR | Status: AC
  Filled 2014-06-08: qty 2

## 2014-06-08 MED ORDER — ACETAMINOPHEN 325 MG PO TABS: 650.0000 mg | ORAL_TABLET | ORAL | Status: DC | PRN

## 2014-06-08 MED ORDER — SODIUM CHLORIDE 0.9 % IV SOLN: 1.0000 mL/kg/h | INTRAVENOUS | Status: AC

## 2014-06-08 MED ORDER — SODIUM CHLORIDE 0.9 % IV SOLN: 250.0000 mL | INTRAVENOUS | Status: DC | PRN

## 2014-06-08 MED ORDER — HEPARIN (PORCINE) IN NACL 2-0.9 UNIT/ML-% IJ SOLN
INTRAMUSCULAR | Status: AC
  Filled 2014-06-08: qty 1500

## 2014-06-08 MED ORDER — SODIUM CHLORIDE 0.9 % IV SOLN
0.2500 mg/kg/h | INTRAVENOUS | Status: DC
  Filled 2014-06-08: qty 250

## 2014-06-08 MED ORDER — MIDAZOLAM HCL 2 MG/2ML IJ SOLN
INTRAMUSCULAR | Status: AC
  Filled 2014-06-08: qty 2

## 2014-06-08 MED ORDER — VERAPAMIL HCL 2.5 MG/ML IV SOLN
INTRAVENOUS | Status: AC
  Filled 2014-06-08: qty 2

## 2014-06-08 MED ORDER — SODIUM CHLORIDE 0.9 % IJ SOLN: 3.0000 mL | INTRAMUSCULAR | Status: DC | PRN

## 2014-06-08 MED ORDER — ONDANSETRON HCL 4 MG/2ML IJ SOLN: 4.0000 mg | Freq: Four times a day (QID) | INTRAMUSCULAR | Status: DC | PRN

## 2014-06-08 NOTE — CV Procedure (Signed)
Cardiac Catheterization Procedure Note  Name: Reginald Little MRN: [id number] DOB: [birth year]  Procedure: Catheter placement, Selective Coronary Angiography, PTCA and stenting of the right first posterolateral, PTCA and stenting of the 2nd OM  Indication: Ischemic chest pain, abnormal Myoview scan with inferolateral ischemia  Procedural Details:  The right wrist was prepped, draped, and anesthetized with 1% lidocaine. Using the modified Seldinger technique, a 5/6 French Slender sheath was introduced into the right radial artery. 3 mg of verapamil was administered through the sheath, weight-based unfractionated heparin was administered intravenously. Standard Judkins catheters were used for selective coronary angiography and left ventriculography. Catheter exchanges were performed over an exchange length guidewire.  PROCEDURAL FINDINGS Hemodynamics: AO 109/54   Coronary angiography: Coronary dominance: right  Left mainstem: The left main is long. The vessel is patent without obstruction.   Left anterior descending (LAD): The LAD is patent to the apex. The vessel has mild diffuse irregularity with 30-40% stenosis in its midportion. There is no high-grade stenosis identified. The diagonal branches are patent without significant stenoses.  Left circumflex (LCx): The circumflex supplies a small intermediate branch. The AV circumflex is patent with mild irregularity. The first OM is patent. The second OM has a 90% focal stenosis. The AV circumflex terminates in a small PLA branch.  Right coronary artery (RCA): The RCA has a high, anterior origin. The vessel is heavily stented throughout its midportion. There are areas of mild in-stent restenosis, estimated 30-40% luminal narrowing. The PDA is patent. The first PLA branch has a 90% focal stenosis.  Left ventriculography: Deferred. LV function was normal by echo.  PCI Note:  Following the diagnostic procedure, the decision was made to proceed  with PCI of the PLA branch and OM branch. Both of these areas correspond to the finding of inferolateral ischemia on nuclear study. The patient has been on long-term Plavix and this will be continued.  Weight-based bivalirudin was given for anticoagulation. Attention was first turned to the right coronary artery PLA branch. Once a therapeutic ACT was achieved, a 6 [place] a.l. 0.75 cm guide catheter was inserted. The lesion was difficult to wire. Initially, I used a Buyer, retail. Ultimately this was placed in the PDA branch to provide some support for the guide catheter. A whisper wire was then used to cross the lesion with a moderate amount of difficulty.  The lesion was predilated with a 2.0 mm balloon.  The lesion was then stented with a 2.25 x 12 mm Promus DES.  The stent was difficult to deliver through the previously stented segment and across the PLA origin where there was fairly marked angulation.  The stent was postdilated with a 2.5 x 8 mm noncompliant balloon.  Following PCI, there was 0% residual stenosis and TIMI-3 flow. Attention was then turned to the OM branch. An XB LAD 3.57 m guide catheter was utilized. The catheter was difficult to position. The whisper guidewire was used to cross the lesion. The lesion was predilated with the same 2.0 mm balloon. The lesion was then stented with a 2.5 x 12 mm Promus DES deployed at 12 atm. Following stent deployment, the stent appear well expanded. It actually was oversized for the vessel and I elected not to post dilate because of a step up and step down off of the proximal and distal edges of the stent, respectively. Final angiography confirmed an excellent result. The patient tolerated the procedure well. There were no immediate procedural complications. A TR band was used for radial  hemostasis. The patient was transferred to the post catheterization recovery area for further monitoring.  PCI Data: Lesion 1: Vessel - PLA 1 Percent Stenosis (pre)   90 TIMI-flow 3 Stent 2.25x12 mm Promus DES Percent Stenosis (post) 0 TIMI-flow (post) 3  Lesion 2: Vessel - OM2 Percent Stenosis (pre)  90 TIMI-flow 3 Stent 2.25x12 mm Promus DES Percent Stenosis (post) 0 TIMI-flow (post) 3  Radiation dose/Fluoro time: 31.4 minutes  Estimated Blood Loss: minimal  Final Conclusions:   1. Two-vessel coronary artery disease with successful PCI of the obtuse marginal and PLA branches. 2. Continued patency of the RCA stents with mild in-stent restenosis 3. Nonobstructive LAD stenosis 4. Normal LV function by echo assessment   Recommendations:  Continue dual antiplatelet therapy with aspirin and Plavix for another 12 months.  [name] MD, [facility] 06/08/2014, 2:29 PM

## 2014-06-08 NOTE — H&P (View-Only) (Signed)
Primary Cardiologist: Dr. Gwenlyn Found (New). Followed in the past at [facility].   Patient Profile: 63 y/o male with h/o CAD s/p DES x2 two years ago at [facility]. Also with h/o remote tobacco abuse, HTN and HLD. Admitted 06/06/14 for chest pain. Lexiscan NST 06/07/14 interpreted as moderate risk with mixed pattern of reversible and non reversible decreased myocardial perfusion involving the inferior and inferolateral walls of the LEFT ventricle.    Subjective: Currently CP free. No dyspnea.    Objective: Vital signs in last 24 hours: Temp:  [97.9 F (36.6 C)-98.4 F (36.9 C)] 97.9 F (36.6 C) (12/28 0500) Pulse Rate:  [66-80] 66 (12/28 0500) Resp:  [16-18] 16 (12/28 0500) BP: (129-148)/(37-72) 141/52 mmHg (12/28 0500) SpO2:  [95 %-99 %] 95 % (12/28 0500) Weight:  [209 lb 12.8 oz (95.165 kg)] 209 lb 12.8 oz (95.165 kg) (12/28 0500) Last BM Date: 06/05/14  Intake/Output from previous day:   Intake/Output this shift:    Medications Current Facility-Administered Medications  Medication Dose Route Frequency Provider Last Rate Last Dose  . 0.9 %  sodium chloride infusion  250 mL Intravenous PRN Lorretta Harp, MD      . acetaminophen (TYLENOL) tablet 650 mg  650 mg Oral Q4H PRN Berle Mull, MD      . Derrill Memo ON 06/09/2014] aspirin EC tablet 81 mg  81 mg Oral Daily Dawood Elgergawy, MD      . atenolol (TENORMIN) tablet 50 mg  50 mg Oral Daily Berle Mull, MD   50 mg at 06/07/14 1040  . atorvastatin (LIPITOR) tablet 20 mg  20 mg Oral q1800 Phillips Climes, MD   20 mg at 06/07/14 1839  . clopidogrel (PLAVIX) tablet 75 mg  75 mg Oral Daily Berle Mull, MD   75 mg at 06/07/14 1041  . enoxaparin (LOVENOX) injection 40 mg  40 mg Subcutaneous Daily Berle Mull, MD   40 mg at 06/07/14 1040  . losartan (COZAAR) tablet 50 mg  50 mg Oral Daily Berle Mull, MD   50 mg at 06/07/14 1040  . ondansetron (ZOFRAN) injection 4 mg  4 mg Intravenous Q6H PRN Berle Mull, MD      . pantoprazole (PROTONIX) EC  tablet 40 mg  40 mg Oral Daily Berle Mull, MD   40 mg at 06/07/14 1040  . sodium chloride 0.9 % injection 3 mL  3 mL Intravenous Q12H Lorretta Harp, MD   3 mL at 06/07/14 2230  . sodium chloride 0.9 % injection 3 mL  3 mL Intravenous PRN Lorretta Harp, MD        PE: General appearance: alert, cooperative and no distress Neck: no carotid bruit and no JVD Lungs: clear to auscultation bilaterally Heart: regular rate and rhythm, S1, S2 normal, no murmur, click, rub or gallop Extremities: no LEE Pulses: 2+ and symmetric Skin: warm and dry Neurologic: Grossly normal  Lab Results:   Recent Labs  06/06/14 0802 06/07/14 0100 06/08/14 0054  WBC 6.2 5.8 5.1  HGB 13.9 13.4 12.7*  HCT 41.8 40.2 38.8*  PLT 205 193 180   BMET  Recent Labs  06/06/14 0802 06/07/14 0100 06/08/14 0054  NA 139 139 139  K 3.9 4.2 4.0  CL 105 105 104  CO2 28 28 29   GLUCOSE 134* 118* 209*  BUN 21 18 17   CREATININE 0.90 0.94 0.93  CALCIUM 9.0 9.0 8.7   PT/INR  Recent Labs  06/06/14 0802  LABPROT 13.7  INR 1.04  Cardiac Panel (last 3 results)  Recent Labs  06/07/14 1400 06/07/14 1902 06/08/14 0054  TROPONINI <0.03 <0.03 <0.03    Studies/Results: 2D echo 06/06/14 Study Conclusions  - Left ventricle: The cavity size was normal. Wall thickness was increased in a pattern of mild LVH. Systolic function was normal. The estimated ejection fraction was in the range of 55% to 60%. Wall motion was normal; there were no regional wall motion abnormalities. Doppler parameters are consistent with abnormal left ventricular relaxation (grade 1 diastolic dysfunction). Doppler parameters are consistent with high ventricular filling pressure. - Aortic root: The aortic root was mildly dilated. - Mitral valve: Calcified annulus.  Impressions:  - Normal LV function; grade 1 diastolic dysfuntion; mild LVH; mildly dilated aortic root.  NST 06/07/14 IMPRESSION: 1. Mixed  pattern of reversible and non reversible decreased myocardial perfusion involving the inferior and inferolateral walls of the LEFT ventricle as above.  2. Dyskinetic appearing inferior wall LEFT ventricle.  3. Left ventricular ejection fraction 59%  4. Moderate-risk stress test findings*.  Assessment/Plan  Principal Problem:   Chest pain Active Problems:   CAD (coronary artery disease)  1. Chest Pain: Cardiac enzymes are negative x 3. 2D echo 06/06/14 demonstrated normal LV function with EF of 55-60% and no WMA.  Abnormal NST 12/27. Will plan for diagnostic LHC +/- PCI today.   2. HTN: Continue BB and ARB.   3. HLD: continue Lipitor.     LOS: 3 days    Brittainy M. Ladoris Gene 06/08/2014 8:47 AM  Personally seen and examined. Agree with above. Candee Furbish, MD

## 2014-06-08 NOTE — Progress Notes (Signed)
UR completed 

## 2014-06-08 NOTE — Progress Notes (Signed)
TR BAND REMOVAL  LOCATION:    right radial  DEFLATED PER PROTOCOL:    Yes.    TIME BAND OFF / DRESSING APPLIED:    1800   SITE UPON ARRIVAL:    Level 0  SITE AFTER BAND REMOVAL:    Level 0  REVERSE ALLEN'S TEST:     positive  CIRCULATION SENSATION AND MOVEMENT:    Within Normal Limits   Yes.    COMMENTS:   Patient tolerated well. No bleeding or hematoma noted. Dressing in place C/D/I.

## 2014-06-08 NOTE — Progress Notes (Signed)
Primary Cardiologist: Dr. Gwenlyn Found (New). Followed in the past at [facility].   Patient Profile: 63 y/o male with h/o CAD s/p DES x2 two years ago at [facility]. Also with h/o remote tobacco abuse, HTN and HLD. Admitted 06/06/14 for chest pain. Lexiscan NST 06/07/14 interpreted as moderate risk with mixed pattern of reversible and non reversible decreased myocardial perfusion involving the inferior and inferolateral walls of the LEFT ventricle.    Subjective: Currently CP free. No dyspnea.    Objective: Vital signs in last 24 hours: Temp:  [97.9 F (36.6 C)-98.4 F (36.9 C)] 97.9 F (36.6 C) (12/28 0500) Pulse Rate:  [66-80] 66 (12/28 0500) Resp:  [16-18] 16 (12/28 0500) BP: (129-148)/(37-72) 141/52 mmHg (12/28 0500) SpO2:  [95 %-99 %] 95 % (12/28 0500) Weight:  [209 lb 12.8 oz (95.165 kg)] 209 lb 12.8 oz (95.165 kg) (12/28 0500) Last BM Date: 06/05/14  Intake/Output from previous day:   Intake/Output this shift:    Medications Current Facility-Administered Medications  Medication Dose Route Frequency Provider Last Rate Last Dose  . 0.9 %  sodium chloride infusion  250 mL Intravenous PRN Lorretta Harp, MD      . acetaminophen (TYLENOL) tablet 650 mg  650 mg Oral Q4H PRN Berle Mull, MD      . Derrill Memo ON 06/09/2014] aspirin EC tablet 81 mg  81 mg Oral Daily Dawood Elgergawy, MD      . atenolol (TENORMIN) tablet 50 mg  50 mg Oral Daily Berle Mull, MD   50 mg at 06/07/14 1040  . atorvastatin (LIPITOR) tablet 20 mg  20 mg Oral q1800 Phillips Climes, MD   20 mg at 06/07/14 1839  . clopidogrel (PLAVIX) tablet 75 mg  75 mg Oral Daily Berle Mull, MD   75 mg at 06/07/14 1041  . enoxaparin (LOVENOX) injection 40 mg  40 mg Subcutaneous Daily Berle Mull, MD   40 mg at 06/07/14 1040  . losartan (COZAAR) tablet 50 mg  50 mg Oral Daily Berle Mull, MD   50 mg at 06/07/14 1040  . ondansetron (ZOFRAN) injection 4 mg  4 mg Intravenous Q6H PRN Berle Mull, MD      . pantoprazole (PROTONIX) EC  tablet 40 mg  40 mg Oral Daily Berle Mull, MD   40 mg at 06/07/14 1040  . sodium chloride 0.9 % injection 3 mL  3 mL Intravenous Q12H Lorretta Harp, MD   3 mL at 06/07/14 2230  . sodium chloride 0.9 % injection 3 mL  3 mL Intravenous PRN Lorretta Harp, MD        PE: General appearance: alert, cooperative and no distress Neck: no carotid bruit and no JVD Lungs: clear to auscultation bilaterally Heart: regular rate and rhythm, S1, S2 normal, no murmur, click, rub or gallop Extremities: no LEE Pulses: 2+ and symmetric Skin: warm and dry Neurologic: Grossly normal  Lab Results:   Recent Labs  06/06/14 0802 06/07/14 0100 06/08/14 0054  WBC 6.2 5.8 5.1  HGB 13.9 13.4 12.7*  HCT 41.8 40.2 38.8*  PLT 205 193 180   BMET  Recent Labs  06/06/14 0802 06/07/14 0100 06/08/14 0054  NA 139 139 139  K 3.9 4.2 4.0  CL 105 105 104  CO2 28 28 29   GLUCOSE 134* 118* 209*  BUN 21 18 17   CREATININE 0.90 0.94 0.93  CALCIUM 9.0 9.0 8.7   PT/INR  Recent Labs  06/06/14 0802  LABPROT 13.7  INR 1.04  Cardiac Panel (last 3 results)  Recent Labs  06/07/14 1400 06/07/14 1902 06/08/14 0054  TROPONINI <0.03 <0.03 <0.03    Studies/Results: 2D echo 06/06/14 Study Conclusions  - Left ventricle: The cavity size was normal. Wall thickness was increased in a pattern of mild LVH. Systolic function was normal. The estimated ejection fraction was in the range of 55% to 60%. Wall motion was normal; there were no regional wall motion abnormalities. Doppler parameters are consistent with abnormal left ventricular relaxation (grade 1 diastolic dysfunction). Doppler parameters are consistent with high ventricular filling pressure. - Aortic root: The aortic root was mildly dilated. - Mitral valve: Calcified annulus.  Impressions:  - Normal LV function; grade 1 diastolic dysfuntion; mild LVH; mildly dilated aortic root.  NST 06/07/14 IMPRESSION: 1. Mixed  pattern of reversible and non reversible decreased myocardial perfusion involving the inferior and inferolateral walls of the LEFT ventricle as above.  2. Dyskinetic appearing inferior wall LEFT ventricle.  3. Left ventricular ejection fraction 59%  4. Moderate-risk stress test findings*.  Assessment/Plan  Principal Problem:   Chest pain Active Problems:   CAD (coronary artery disease)  1. Chest Pain: Cardiac enzymes are negative x 3. 2D echo 06/06/14 demonstrated normal LV function with EF of 55-60% and no WMA.  Abnormal NST 12/27. Will plan for diagnostic LHC +/- PCI today.   2. HTN: Continue BB and ARB.   3. HLD: continue Lipitor.     LOS: 3 days    Brittainy M. Ladoris Gene 06/08/2014 8:47 AM  Personally seen and examined. Agree with above. Candee Furbish, MD

## 2014-06-08 NOTE — Interval H&P Note (Signed)
History and Physical Interval Note:  06/08/2014 12:38 PM  Reginald Little  has presented today for surgery, with the diagnosis of unstable angina  The various methods of treatment have been discussed with the patient and family. After consideration of risks, benefits and other options for treatment, the patient has consented to  Procedure(s): LEFT HEART CATHETERIZATION WITH CORONARY ANGIOGRAM (N/A) as a surgical intervention .  The patient's history has been reviewed, patient examined, no change in status, stable for surgery.  I have reviewed the patient's chart and labs.  Questions were answered to the patient's satisfaction.    Cath Lab Visit (complete for each Cath Lab visit)  Clinical Evaluation Leading to the Procedure:   ACS: No.  Non-ACS:    Anginal Classification: CCS II  Anti-ischemic medical therapy: Minimal Therapy (1 class of medications)  Non-Invasive Test Results: Intermediate-risk stress test findings: cardiac mortality 1-3%/year  Prior CABG: No previous CABG       Reginald Little

## 2014-06-08 NOTE — Progress Notes (Signed)
Patient Demographics  Reginald Little, is a 63 y.o. male, DOB - 1950/12/11, ION:[id number]  Admit date - 06/05/2014   Admitting Physician Berle Mull, MD  Outpatient Primary MD for the patient is Pcp Not In System  LOS - 3   Chief Complaint  Patient presents with  . Chest Pain      Admission history of present illness/brief narrative: Reginald Little is a 63 y.o. male with Past medical history of coronary artery disease status post PCI to RCA with 2 stents in January 14 after a STEMI, carotid endarterectomy. Presents with complaints of left-sided chest pain which was sharp lasting for a few seconds and occurring on exertion and resolving on its own. This has been ongoing for last to 3 days with increasing frequency. He occasionally has shortness of breath with them. Most recent chest pain was accompanied with elevated systolic blood pressure in 190s ,therefore he decided to come to the hospital. Cardiac enzymes were negative 3, has been seen by cardiology,nuclear stress test showing mild-moderate inferior ischemia, had cardiac cath on 12/28, showing progressive coronary artery disease with successful PCI of the obtuse marginal and PLA branches.  Subjective:   [name] today has, No headache, No chest pain, No abdominal pain - No Nausea, No new weakness tingling or numbness, No Cough - SOB.   Assessment & Plan    Principal Problem:   Chest pain Active Problems:   CAD (coronary artery disease)   Ischemic chest pain  Chest pain - Cardiology consult appreciated, continue to monitor on telemetry,  cardiac enzymes negative 3, the stress test showing mild to moderate inferior ischemia, cardiac cath done 12/28, coronary artery disease with successful PCI of the obtuse marginal and PLA branches. - Continue with dual antiplatelet therapy with aspirin and Plavix for another  12 month  Hypertension -Continue with atenolol, losartan.  Coronary artery disease -Continue with aspirin, beta blockers, Plavix, losartan  Hyperlipidemia -Started on Lipitor 20 mg oral at bedtime  Code Status: Full  Family Communication: None at bedside  Disposition Plan: Remains on telemetry   Procedur  nuclear stress test 12/27   cardiac cath 12/28  Consults   Cardiology   Medications  Scheduled Meds: . [START ON 06/09/2014] aspirin EC  81 mg Oral Daily  . atenolol  50 mg Oral Daily  . atorvastatin  20 mg Oral q1800  . clopidogrel  75 mg Oral Daily  . enoxaparin (LOVENOX) injection  40 mg Subcutaneous Daily  . losartan  50 mg Oral Daily  . pantoprazole  40 mg Oral Daily  . sodium chloride  3 mL Intravenous Q12H   Continuous Infusions: . sodium chloride 1 mL/kg/hr (06/08/14 1446)   PRN Meds:.sodium chloride, acetaminophen, ondansetron (ZOFRAN) IV, oxyCODONE-acetaminophen, sodium chloride  DVT Prophylaxis Lovenox  Lab Results  Component Value Date   PLT 180 06/08/2014    Antibiotics   Anti-infectives    None          Objective:   Filed Vitals:   06/08/14 1445 06/08/14 1500 06/08/14 1520 06/08/14 1530  BP: 150/66 161/72 168/63 152/70  Pulse: 65 64 65 61  Temp: 97.8 F (36.6 C)     TempSrc: Oral     Resp: 20  Height:      Weight:      SpO2: 98% 98% 99% 99%    Wt Readings from Last 3 Encounters:  06/08/14 95.165 kg (209 lb 12.8 oz)  05/26/13 94.802 kg (209 lb)    No intake or output data in the 24 hours ending 06/08/14 1800   Physical Exam  Awake Alert, Oriented X 3, No new F.N deficits, Normal affect [place].AT,PERRAL Supple Neck,No JVD, No cervical lymphadenopathy appriciated.  Symmetrical Chest wall movement, Good air movement bilaterally, CTAB RRR,No Gallops,Rubs or new Murmurs, No Parasternal Heave +ve B.Sounds, Abd Soft, No tenderness, No organomegaly appriciated, No rebound - guarding or rigidity. No Cyanosis, Clubbing or  edema, No new Rash or bruise     Data Review   Micro Results No results found for this or any previous visit (from the past 240 hour(s)).  Radiology Reports Nm Myocar Multi W/spect W/wall Motion / Ef  06/07/2014   CLINICAL DATA:  Atypical chest pain, history hypertension, coronary artery disease post stenting, hypercholesterolemia, obesity, prior carotid endarterectomy, former smoker  EXAM: MYOCARDIAL IMAGING WITH SPECT (REST AND PHARMACOLOGIC-STRESS)  GATED LEFT VENTRICULAR WALL MOTION STUDY  LEFT VENTRICULAR EJECTION FRACTION  TECHNIQUE: Standard myocardial SPECT imaging was performed after resting intravenous injection of 10 mCi Tc-32m sestamibi. Subsequently, intravenous infusion of Lexiscan was performed under the supervision of the Cardiology staff. At peak effect of the drug, 30 mCi Tc-108m sestamibi was injected intravenously and standard myocardial SPECT imaging was performed. Quantitative gated imaging was also performed to evaluate left ventricular wall motion, and estimate left ventricular ejection fraction.  COMPARISON:  None.  FINDINGS: Perfusion: Large area of severe decreased myocardial perfusion on pharmacologic stress images involving the inferior and inferolateral walls of the LEFT ventricle. Resting exam demonstrates reperfusion of the inferolateral wall. Inferior wall defect shows no reversibility.  Wall Motion: Dyskinetic inferior wall LEFT ventricle. No LV dilatation.  Left Ventricular Ejection Fraction: 97% %  End diastolic volume 72 ml  End systolic volume 29 ml  IMPRESSION: 1. Mixed pattern of reversible and non reversible decreased myocardial perfusion involving the inferior and inferolateral walls of the LEFT ventricle as above.  2. Dyskinetic appearing inferior wall LEFT ventricle.  3. Left ventricular ejection fraction 59%%  4. Moderate-risk stress test findings*.  *2012 Appropriate Use Criteria for Coronary Revascularization Focused Update: J Am Coll Cardiol.  4163;84(5):364-680. http://content.airportbarriers.com.aspx?articleid=1201161   Electronically Signed   By: Lavonia Dana M.D.   On: 06/07/2014 12:58    CBC  Recent Labs Lab 06/05/14 2359 06/06/14 0802 06/07/14 0100 06/08/14 0054  WBC 5.9 6.2 5.8 5.1  HGB 14.4 13.9 13.4 12.7*  HCT 42.4 41.8 40.2 38.8*  PLT 193 205 193 180  MCV 91.0 89.7 90.1 92.4  MCH 30.9 29.8 30.0 30.2  MCHC 34.0 33.3 33.3 32.7  RDW 12.5 12.7 12.6 12.7  LYMPHSABS 1.9 2.2  --   --   MONOABS 0.6 0.4  --   --   EOSABS 0.4 0.4  --   --   BASOSABS 0.1 0.1  --   --     Chemistries   Recent Labs Lab 06/05/14 2359 06/06/14 0802 06/07/14 0100 06/08/14 0054  NA 139 139 139 139  K 4.3 3.9 4.2 4.0  CL 103 105 105 104  CO2 30 28 28 29   GLUCOSE 134* 134* 118* 209*  BUN 23 21 18 17   CREATININE 0.95 0.90 0.94 0.93  CALCIUM 9.3 9.0 9.0 8.7  AST 29 29  --   --  ALT 53 48  --   --   ALKPHOS 79 69  --   --   BILITOT 0.8 0.7  --   --    ------------------------------------------------------------------------------------------------------------------ estimated creatinine clearance is 89.8 mL/min (by C-G formula based on Cr of 0.93). ------------------------------------------------------------------------------------------------------------------ No results for input(s): HGBA1C in the last 72 hours. ------------------------------------------------------------------------------------------------------------------ No results for input(s): CHOL, HDL, LDLCALC, TRIG, CHOLHDL, LDLDIRECT in the last 72 hours. ------------------------------------------------------------------------------------------------------------------ No results for input(s): TSH, T4TOTAL, T3FREE, THYROIDAB in the last 72 hours.  Invalid input(s): FREET3 ------------------------------------------------------------------------------------------------------------------ No results for input(s): VITAMINB12, FOLATE, FERRITIN, TIBC, IRON, RETICCTPCT in  the last 72 hours.  Coagulation profile  Recent Labs Lab 06/06/14 0802  INR 1.04    No results for input(s): DDIMER in the last 72 hours.  Cardiac Enzymes  Recent Labs Lab 06/07/14 1902 06/08/14 0054 06/08/14 0924  TROPONINI <0.03 <0.03 <0.03   ------------------------------------------------------------------------------------------------------------------ Invalid input(s): POCBNP     Time Spent in minutes   30 minutes   Rhylee Nunn M.D on 06/08/2014 at 6:00 PM  Between 7am to 7pm - Pager - [phone]  After 7pm go to [email or web address] - password [email or web address]  And look for the night coverage person covering for me after hours  [facility] Office  [phone]   **Disclaimer: This note may have been dictated with voice recognition software. Similar sounding words can inadvertently be transcribed and this note may contain transcription errors which may not have been corrected upon publication of note.**

## 2014-06-09 DIAGNOSIS — I25119 Atherosclerotic heart disease of native coronary artery with unspecified angina pectoris: Secondary | ICD-10-CM

## 2014-06-09 DIAGNOSIS — I209 Angina pectoris, unspecified: Secondary | ICD-10-CM

## 2014-06-09 DIAGNOSIS — Z955 Presence of coronary angioplasty implant and graft: Secondary | ICD-10-CM

## 2014-06-09 LAB — CBC
HEMATOCRIT: 40.8 % (ref 39.0–52.0)
Hemoglobin: 13.4 g/dL (ref 13.0–17.0)
MCH: 29.7 pg (ref 26.0–34.0)
MCHC: 32.8 g/dL (ref 30.0–36.0)
MCV: 90.5 fL (ref 78.0–100.0)
Platelets: 176 10*3/uL (ref 150–400)
RBC: 4.51 MIL/uL (ref 4.22–5.81)
RDW: 12.7 % (ref 11.5–15.5)
WBC: 5.7 10*3/uL (ref 4.0–10.5)

## 2014-06-09 LAB — BASIC METABOLIC PANEL
Anion gap: 8 (ref 5–15)
BUN: 16 mg/dL (ref 6–23)
CALCIUM: 8.8 mg/dL (ref 8.4–10.5)
CO2: 25 mmol/L (ref 19–32)
CREATININE: 0.87 mg/dL (ref 0.50–1.35)
Chloride: 108 mEq/L (ref 96–112)
GFR calc Af Amer: >90 mL/min (ref >90)
GFR calc non Af Amer: 89 mL/min — ABNORMAL LOW (ref >90)
GLUCOSE: 116 mg/dL — AB (ref 70–99)
Potassium: 3.7 mmol/L (ref 3.5–5.1)
Sodium: 141 mmol/L (ref 135–145)

## 2014-06-09 LAB — POCT ACTIVATED CLOTTING TIME: Activated Clotting Time: 515 seconds

## 2014-06-09 MED FILL — Sodium Chloride IV Soln 0.9%: INTRAVENOUS | Qty: 50 | Status: AC

## 2014-06-09 NOTE — Discharge Instructions (Signed)
Follow with Primary MD  in 7 days  ° °Get CBC, CMP, 2 view Chest X ray checked  by Primary MD next visit.  ° ° °Activity: As tolerated with Full fall precautions use walker/cane & assistance as needed ° ° °Disposition Home  ° ° °Diet: Heart Healthy  , with feeding assistance and aspiration precautions as needed. ° °For Heart failure patients - Check your Weight same time everyday, if you gain over 2 pounds, or you develop in leg swelling, experience more shortness of breath or chest pain, call your Primary MD immediately. Follow Cardiac Low Salt Diet and 1.8 lit/day fluid restriction. ° ° °On your next visit with your primary care physician please Get Medicines reviewed and adjusted. ° ° °Please request your Prim.MD to go over all Hospital Tests and Procedure/Radiological results at the follow up, please get all Hospital records sent to your Prim MD by signing hospital release before you go home. ° ° °If you experience worsening of your admission symptoms, develop shortness of breath, life threatening emergency, suicidal or homicidal thoughts you must seek medical attention immediately by calling 911 or calling your MD immediately  if symptoms less severe. ° °You Must read complete instructions/literature along with all the possible adverse reactions/side effects for all the Medicines you take and that have been prescribed to you. Take any new Medicines after you have completely understood and accpet all the possible adverse reactions/side effects.  ° °Do not drive, operating heavy machinery, perform activities at heights, swimming or participation in water activities or provide baby sitting services if your were admitted for syncope or siezures until you have seen by Primary MD or a Neurologist and advised to do so again. ° °Do not drive when taking Pain medications.  ° ° °Do not take more than prescribed Pain, Sleep and Anxiety Medications ° °Special Instructions: If you have smoked or chewed Tobacco  in the last  2 yrs please stop smoking, stop any regular Alcohol  and or any Recreational drug use. ° °Wear Seat belts while driving. ° ° °Please note ° °You were cared for by a hospitalist during your hospital stay. If you have any questions about your discharge medications or the care you received while you were in the hospital after you are discharged, you can call the unit and asked to speak with the hospitalist on call if the hospitalist that took care of you is not available. Once you are discharged, your primary care physician will handle any further medical issues. Please note that NO REFILLS for any discharge medications will be authorized once you are discharged, as it is imperative that you return to your primary care physician (or establish a relationship with a primary care physician if you do not have one) for your aftercare needs so that they can reassess your need for medications and monitor your lab values. ° °

## 2014-06-09 NOTE — Progress Notes (Signed)
Patient profile: 63 y/o male with h/o CAD s/p DES x2 two years ago at [facility]. Also with h/o remote tobacco abuse, HTN and HLD. Admitted 06/06/14 for chest pain. Lexiscan NST 06/07/14 interpreted as moderate risk with mixed pattern of reversible and non reversible decreased myocardial perfusion involving the inferior and inferolateral walls of the LEFT ventricle.   Subjective: No recurrent CP. Denies dyspnea.   Objective: Vital signs in last 24 hours: Temp:  [97.6 F (36.4 C)-98.6 F (37 C)] 98 F (36.7 C) (12/29 0441) Pulse Rate:  [61-84] 75 (12/29 0441) Resp:  [16-20] 20 (12/29 0441) BP: (104-168)/(52-73) 124/52 mmHg (12/29 0441) SpO2:  [95 %-100 %] 95 % (12/29 0441) Weight:  [199 lb 4.7 oz (90.4 kg)] 199 lb 4.7 oz (90.4 kg) (12/29 0038) Last BM Date: 06/07/14  Intake/Output from previous day: 12/28 0701 - 12/29 0700 In: 928.6 [P.O.:240; I.V.:688.6] Out: 1450 [Urine:1450] Intake/Output this shift:    Medications Current Facility-Administered Medications  Medication Dose Route Frequency Provider Last Rate Last Dose  . 0.9 %  sodium chloride infusion  250 mL Intravenous PRN Blane Ohara, MD      . acetaminophen (TYLENOL) tablet 650 mg  650 mg Oral Q4H PRN Berle Mull, MD      . aspirin EC tablet 81 mg  81 mg Oral Daily Dawood Elgergawy, MD      . atenolol (TENORMIN) tablet 50 mg  50 mg Oral Daily Berle Mull, MD   50 mg at 06/08/14 0935  . atorvastatin (LIPITOR) tablet 20 mg  20 mg Oral q1800 Phillips Climes, MD   20 mg at 06/08/14 1815  . clopidogrel (PLAVIX) tablet 75 mg  75 mg Oral Daily Berle Mull, MD   75 mg at 06/08/14 0935  . enoxaparin (LOVENOX) injection 40 mg  40 mg Subcutaneous Daily Berle Mull, MD   40 mg at 06/07/14 1040  . losartan (COZAAR) tablet 50 mg  50 mg Oral Daily Berle Mull, MD   50 mg at 06/08/14 0935  . ondansetron (ZOFRAN) injection 4 mg  4 mg Intravenous Q6H PRN Berle Mull, MD      . oxyCODONE-acetaminophen (PERCOCET/ROXICET) 5-325 MG  per tablet 1-2 tablet  1-2 tablet Oral Q4H PRN Blane Ohara, MD      . pantoprazole (PROTONIX) EC tablet 40 mg  40 mg Oral Daily Berle Mull, MD   40 mg at 06/08/14 0936  . sodium chloride 0.9 % injection 3 mL  3 mL Intravenous Q12H Blane Ohara, MD   3 mL at 06/08/14 2230  . sodium chloride 0.9 % injection 3 mL  3 mL Intravenous PRN Blane Ohara, MD        PE: General appearance: alert, cooperative and no distress Neck: no carotid bruit and no JVD Lungs: clear to auscultation bilaterally Heart: regular rate and rhythm, S1, S2 normal, no murmur, click, rub or gallop Extremities: no LEE Pulses: 2+ and symmetric Skin: warm and dry Neurologic: Grossly normal  Lab Results:   Recent Labs  06/07/14 0100 06/08/14 0054 06/09/14 0431  WBC 5.8 5.1 5.7  HGB 13.4 12.7* 13.4  HCT 40.2 38.8* 40.8  PLT 193 180 176   BMET  Recent Labs  06/07/14 0100 06/08/14 0054 06/09/14 0431  NA 139 139 141  K 4.2 4.0 3.7  CL 105 104 108  CO2 28 29 25   GLUCOSE 118* 209* 116*  BUN 18 17 16   CREATININE 0.94 0.93 0.87  CALCIUM 9.0 8.7 8.8  PT/INR  Recent Labs  06/06/14 0802  LABPROT 13.7  INR 1.04   Cholesterol No results for input(s): CHOL in the last 72 hours. Cardiac Enzymes Invalid input(s): TROPONIN,  CKMB  Studies/Results:  LHC 06/08/2014 PCI Data: Lesion 1: Vessel - PLA 1 Percent Stenosis (pre) 90 TIMI-flow 3 Stent 2.25x12 mm Promus DES Percent Stenosis (post) 0 TIMI-flow (post) 3  Lesion 2: Vessel - OM2 Percent Stenosis (pre) 90 TIMI-flow 3 Stent 2.25x12 mm Promus DES Percent Stenosis (post) 0 TIMI-flow (post) 3  Radiation dose/Fluoro time: 31.4 minutes  Estimated Blood Loss: minimal  Final Conclusions:  1. Two-vessel coronary artery disease with successful PCI of the obtuse marginal and PLA branches. 2. Continued patency of the RCA stents with mild in-stent restenosis 3. Nonobstructive LAD stenosis 4. Normal LV function by echo assessment     Assessment/Plan  Principal Problem:   Chest pain Active Problems:   CAD (coronary artery disease)   Ischemic chest pain  1. CAD: Left heart catheterization revealed two-vessel coronary disease as outlined above. Status post successful PCI of the obtuse marginal and PLA branches utilizing drug-eluting stents. LV function is normal by echo. No recurrent CP. Continue dual antiplatelet therapy with aspirin plus Plavix for an additional 12 months. Continue BB, statin and losaratan.  2. Post Cath: right radial access site is stable. Renal function is stable. Will have patient ambulate with cardiac rehab prior to discharge.     LOS: 4 days    Paislee Szatkowski M. Rosita Fire, PA-C 06/09/2014 7:50 AM

## 2014-06-09 NOTE — Progress Notes (Signed)
CARDIAC REHAB PHASE I   PRE:  Rate/Rhythm: 88 SR    BP: sitting 151/86    SaO2:   MODE:  Ambulation: 1000 ft   POST:  Rate/Rhythm: 112 ST    BP: sitting 133/53     SaO2:   Tolerated well. Some SOB at end due to quick pace and distance. Pt sts he has hip pain when he walks 15 min. Discussed resting and starting again. Pt very motivated and receptive. Plans to increase ex and start CRPII if insurance will pay. Will send referral.  [place], [place], ACSM 06/09/2014 8:56 AM

## 2014-06-09 NOTE — Discharge Summary (Signed)
Reginald Little, 63 y.o., DOB Oct 21, 1950, MRN [id number]. Admission date: 06/05/2014 Discharge Date 06/09/2014 Primary MD Pcp Not In System Admitting Physician Berle Mull, MD  Admission Diagnosis  Chest pain [R07.9]  Discharge Diagnosis   Principal Problem:   Chest pain Active Problems:   CAD (coronary artery disease)   Ischemic chest pain      Past Medical History  Diagnosis Date  . Hypertension   . Obesity   . High cholesterol   . Coronary artery disease   . GERD (gastroesophageal reflux disease)   . H/O carotid endarterectomy     Past Surgical History  Procedure Laterality Date  . Coronary angioplasty with stent placement    . Carotid endarterectomy    . Left heart catheterization with coronary angiogram N/A 06/08/2014    Procedure: LEFT HEART CATHETERIZATION WITH CORONARY ANGIOGRAM;  Surgeon: Blane Ohara, MD;  Location: [facility] CATH LAB;  Service: Cardiovascular;  Laterality: N/A;     Hospital Course See H&P, Labs, Consult and Test reports for all details in brief, patient was admitted for   Principal Problem:   Chest pain Active Problems:   CAD (coronary artery disease)   Ischemic chest pain  Admission history of present illness/brief narrative: [name] is a 63 y.o. male with Past medical history of coronary artery disease status post PCI to RCA with 2 stents in January 14 after a STEMI, carotid endarterectomy. Presents with complaints of left-sided chest pain which was sharp lasting for a few seconds and occurring on exertion and resolving on its own. This has been ongoing for last to 3 days with increasing frequency. He occasionally has shortness of breath with them. Most recent chest pain was accompanied with elevated systolic blood pressure in 190s ,therefore he decided to come to the hospital. Cardiac enzymes were negative 3, has been seen by cardiology,nuclear stress test showing mild-moderate inferior ischemia, had cardiac cath on 12/28, showing progressive  coronary artery disease with successful PCI of the obtuse marginal and PLA branch  Chest pain - Cardiology consult appreciated, , cardiac enzymes negative 3, the stress test showing mild to moderate inferior ischemia, cardiac cath done 12/28, coronary artery disease with successful PCI of the obtuse marginal and PLA branches. - Continue with dual antiplatelet therapy with aspirin and Plavix for another 12 month  Hypertension -Continue with atenolol, losartan.  Coronary artery disease -Continue with aspirin, beta blockers, Plavix, losartan  Hyperlipidemia - continue with crestor.  Consults  Cardiology  Significant Tests:  See full reports for all details    Dg Chest 2 View  06/05/2014   CLINICAL DATA:  Moderate intermittent chest pain.  EXAM: CHEST  2 VIEW  COMPARISON:  None.  FINDINGS: The heart size and mediastinal contours are within normal limits. Both lungs are clear. The visualized skeletal structures are unremarkable.  IMPRESSION: No acute cardiopulmonary process.   Electronically Signed   By: Suzy Bouchard M.D.   On: 06/05/2014 23:36   Nm Myocar Multi W/spect W/wall Motion / Ef  06/07/2014   CLINICAL DATA:  Atypical chest pain, history hypertension, coronary artery disease post stenting, hypercholesterolemia, obesity, prior carotid endarterectomy, former smoker  EXAM: MYOCARDIAL IMAGING WITH SPECT (REST AND PHARMACOLOGIC-STRESS)  GATED LEFT VENTRICULAR WALL MOTION STUDY  LEFT VENTRICULAR EJECTION FRACTION  TECHNIQUE: Standard myocardial SPECT imaging was performed after resting intravenous injection of 10 mCi Tc-21m sestamibi. Subsequently, intravenous infusion of Lexiscan was performed under the supervision of the Cardiology staff. At peak effect of the drug, 30 mCi Tc-61m  sestamibi was injected intravenously and standard myocardial SPECT imaging was performed. Quantitative gated imaging was also performed to evaluate left ventricular wall motion, and estimate left ventricular  ejection fraction.  COMPARISON:  None.  FINDINGS: Perfusion: Large area of severe decreased myocardial perfusion on pharmacologic stress images involving the inferior and inferolateral walls of the LEFT ventricle. Resting exam demonstrates reperfusion of the inferolateral wall. Inferior wall defect shows no reversibility.  Wall Motion: Dyskinetic inferior wall LEFT ventricle. No LV dilatation.  Left Ventricular Ejection Fraction: 58% %  End diastolic volume 72 ml  End systolic volume 29 ml  IMPRESSION: 1. Mixed pattern of reversible and non reversible decreased myocardial perfusion involving the inferior and inferolateral walls of the LEFT ventricle as above.  2. Dyskinetic appearing inferior wall LEFT ventricle.  3. Left ventricular ejection fraction 59%%  4. Moderate-risk stress test findings*.  *2012 Appropriate Use Criteria for Coronary Revascularization Focused Update: J Am Coll Cardiol. 0998;33(8):250-539. http://content.airportbarriers.com.aspx?articleid=1201161   Electronically Signed   By: Lavonia Dana M.D.   On: 06/07/2014 12:58     Today   Subjective:   [name] today has no headache,no chest abdominal pain,no new weakness tingling or numbness, feels much better wants to go home today.   Objective:   Blood pressure 124/52, pulse 75, temperature 98 F (36.7 C), temperature source Oral, resp. rate 20, height 5\' 8"  (1.727 m), weight 90.4 kg (199 lb 4.7 oz), SpO2 95 %.  Intake/Output Summary (Last 24 hours) at 06/09/14 1047 Last data filed at 06/09/14 0435  Gross per 24 hour  Intake 928.61 ml  Output   1450 ml  Net -521.39 ml    Exam Awake Alert, Oriented *3, No new F.N deficits, Normal affect [place].AT,PERRAL Supple Neck,No JVD, No cervical lymphadenopathy appriciated.  Symmetrical Chest wall movement, Good air movement bilaterally, CTAB RRR,No Gallops,Rubs or new Murmurs, No Parasternal Heave +ve B.Sounds, Abd Soft, Non tender, No organomegaly appriciated, No rebound -guarding  or rigidity. No Cyanosis, Clubbing or edema, No new Rash or bruise, good pulses, no hematoma at right radial axis site.  Data Review   Cultures -   CBC w Diff: Lab Results  Component Value Date   WBC 5.7 06/09/2014   HGB 13.4 06/09/2014   HCT 40.8 06/09/2014   PLT 176 06/09/2014   LYMPHOPCT 36 06/06/2014   MONOPCT 7 06/06/2014   EOSPCT 7* 06/06/2014   BASOPCT 1 06/06/2014   CMP: Lab Results  Component Value Date   NA 141 06/09/2014   K 3.7 06/09/2014   CL 108 06/09/2014   CO2 25 06/09/2014   BUN 16 06/09/2014   CREATININE 0.87 06/09/2014   PROT 6.6 06/06/2014   ALBUMIN 3.5 06/06/2014   BILITOT 0.7 06/06/2014   ALKPHOS 69 06/06/2014   AST 29 06/06/2014   ALT 48 06/06/2014  .  Micro Results No results found for this or any previous visit (from the past 240 hour(s)).   Discharge Instructions      Follow-up Information    Follow up with HAGER, BRYAN, PA-C On 06/29/2014.   Specialty:  Physician Assistant   Why:  9:30 am (Cardiology follow-up)   Contact information:   [address] [place] [address] [phone]       Follow up with Pcp Not In System. Schedule an appointment as soon as possible for a visit in 1 week.      Discharge Medications     Medication List    TAKE these medications  aspirin EC 81 MG tablet  Take 81 mg by mouth daily.     atenolol 50 MG tablet  Commonly known as:  TENORMIN  Take 50 mg by mouth daily.     calcium carbonate 1250 MG tablet  Commonly known as:  OS-CAL - dosed in mg of elemental calcium  Take 500 mg by mouth daily.     CALCIUM CITRATE PO  Take 1 tablet by mouth daily.     CENTRUM SILVER PO  Take 1 tablet by mouth daily.     clopidogrel 75 MG tablet  Commonly known as:  PLAVIX  Take 75 mg by mouth daily.     co-enzyme Q-10 50 MG capsule  Take 300 mg by mouth daily.     esomeprazole 20 MG capsule  Commonly known as:  NEXIUM  Take 20 mg by mouth daily at 12 noon.     losartan 50 MG  tablet  Commonly known as:  COZAAR  Take 50 mg by mouth daily.     OMEGA-3 PO  Take 1 tablet by mouth daily.     omeprazole 20 MG capsule  Commonly known as:  PRILOSEC  Take 20 mg by mouth daily.     rosuvastatin 10 MG tablet  Commonly known as:  CRESTOR  Take 5 mg by mouth daily.     Vitamin D3 1000 UNITS Caps  Take 1,000 Units by mouth daily.         Total Time in preparing paper work, data evaluation and todays exam - 35 minutes  Rashiya Lofland M.D on 06/09/2014 at 10:47 AM  [facility] Office  [phone]

## 2014-06-29 ENCOUNTER — Ambulatory Visit: Payer: Non-veteran care | Admitting: Physician Assistant

## 2014-07-07 ENCOUNTER — Ambulatory Visit (INDEPENDENT_AMBULATORY_CARE_PROVIDER_SITE_OTHER): Payer: Commercial Managed Care - HMO | Admitting: Physician Assistant

## 2014-07-07 ENCOUNTER — Encounter: Payer: Self-pay | Admitting: Physician Assistant

## 2014-07-07 VITALS — BP 122/70 | HR 92 | Ht 68.5 in | Wt 218.3 lb

## 2014-07-07 DIAGNOSIS — R0789 Other chest pain: Secondary | ICD-10-CM

## 2014-07-07 DIAGNOSIS — I10 Essential (primary) hypertension: Secondary | ICD-10-CM

## 2014-07-07 DIAGNOSIS — E669 Obesity, unspecified: Secondary | ICD-10-CM | POA: Insufficient documentation

## 2014-07-07 DIAGNOSIS — M25552 Pain in left hip: Secondary | ICD-10-CM | POA: Insufficient documentation

## 2014-07-07 DIAGNOSIS — I25119 Atherosclerotic heart disease of native coronary artery with unspecified angina pectoris: Secondary | ICD-10-CM

## 2014-07-07 NOTE — Assessment & Plan Note (Signed)
Blood pressure is well-controlled. He is on losartan, atenolol. No changes.

## 2014-07-07 NOTE — Assessment & Plan Note (Signed)
Been evaluated with x-ray at the [place] previously. They apparently found no skeletal issues. Need vascular workup. Does have 2+ posterior tibialis and dorsalis pedis pulses

## 2014-07-07 NOTE — Patient Instructions (Signed)
Your physician recommends that you schedule a follow-up appointment the first or second week of March with Dr. Gwenlyn Found.  Your physician has recommended you make the following change in your medication: increase the omeprazole to twice daily for 1 week to see if symptoms improve.

## 2014-07-07 NOTE — Assessment & Plan Note (Signed)
We discussed low carb diet and decreasing the number of vegetables he eats.

## 2014-07-07 NOTE — Assessment & Plan Note (Signed)
There is no other significant stenosis indicated was angiogram. His pain sounds noncardiac he doesn't even notice it when he is exerting himself. Asked him to double up on his Nexium for a week and see if that makes a difference. There is no acute changes on his EKG.

## 2014-07-07 NOTE — Progress Notes (Signed)
Patient ID: Reginald Little, male   DOB: [birth year], 64 y.o.   MRN: [id number]    Date:  07/07/2014   ID:  Reginald Little, DOB Jul 21, 1950, MRN [id number]  PCP:  Pcp Not In System  Primary Cardiologist:  Gwenlyn Found    Chief Complaint  Patient presents with  . Follow-up     History of Present Illness: Reginald Little is a 64 y.o. male  with h/o CAD s/p DES x2 two years ago at [facility]. Also with h/o remote tobacco abuse, HTN and HLD. Admitted 06/06/14 for chest pain. Lexiscan NST 06/07/14 interpreted as moderate risk with mixed pattern of reversible and non reversible decreased myocardial perfusion involving the inferior and inferolateral walls of the LEFT ventricle.  Patient underwent coronary angiogram revealing two-vessel disease and subsequent successful PCI of obtuse marginal and PLA branches. Stents were [employer]..  Patient presents today for posthospital evaluation. Reports ongoing chest pain which feels different than it did previously. Typically only occurs when he sitting and does not notice it when he is out and about. He denies any nausea vomiting fever, shortness of breath, diaphoresis, lower extremity edema. Ports she's got a bad left hip which bothers him is walking. He's had x-rays at the [place] which showed no skeletal abnormality.  He is going on a cruise around the middle of March.  The patient currently denies orthopnea, dizziness, PND, cough, congestion, abdominal pain, hematochezia, melena.  Wt Readings from Last 3 Encounters:  07/07/14 218 lb 4.8 oz (99.02 kg)  06/09/14 199 lb 4.7 oz (90.4 kg)  05/26/13 209 lb (94.802 kg)     Past Medical History  Diagnosis Date  . Hypertension   . Obesity   . High cholesterol   . Coronary artery disease   . GERD (gastroesophageal reflux disease)   . H/O carotid endarterectomy     Current Outpatient Prescriptions  Medication Sig Dispense Refill  . aspirin EC 81 MG tablet Take 81 mg by mouth daily.    Marland Kitchen atenolol (TENORMIN) 50 MG tablet Take 50 mg  by mouth daily.    . calcium carbonate (OS-CAL - DOSED IN MG OF ELEMENTAL CALCIUM) 1250 MG tablet Take 500 mg by mouth daily.    Marland Kitchen CALCIUM CITRATE PO Take 1 tablet by mouth daily.     . Cholecalciferol (VITAMIN D3) 1000 UNITS CAPS Take 1,000 Units by mouth daily.     . clopidogrel (PLAVIX) 75 MG tablet Take 75 mg by mouth daily.    Marland Kitchen co-enzyme Q-10 50 MG capsule Take 300 mg by mouth daily.    Marland Kitchen esomeprazole (NEXIUM) 20 MG capsule Take 20 mg by mouth daily at 12 noon.    Marland Kitchen losartan (COZAAR) 50 MG tablet Take 50 mg by mouth daily.    . Multiple Vitamins-Minerals (CENTRUM SILVER PO) Take 1 tablet by mouth daily.    . Omega-3 Fatty Acids (OMEGA-3 PO) Take 1 tablet by mouth daily.    Marland Kitchen omeprazole (PRILOSEC) 20 MG capsule Take 20 mg by mouth daily.    . rosuvastatin (CRESTOR) 10 MG tablet Take 5 mg by mouth daily.     No current facility-administered medications for this visit.    Allergies:    Allergies  Allergen Reactions  . Penicillins Rash    Social History:  The patient  reports that he quit smoking about 2 years ago. He does not have any smokeless tobacco history on file. He reports that he does not drink alcohol or use illicit drugs.   Family  history:  No family history on file.  ROS:  Please see the history of present illness.  All other systems reviewed and negative.   PHYSICAL EXAM: VS:  BP 122/70 mmHg  Pulse 92  Ht 5' 8.5" (1.74 m)  Wt 218 lb 4.8 oz (99.02 kg)  BMI 32.71 kg/m2 Obese, well developed, in no acute distress HEENT: Pupils are equal round react to light accommodation extraocular movements are intact.  Neck: no JVDNo cervical lymphadenopathy. Cardiac: Regular rate and rhythm without murmurs rubs or gallops. Lungs:  clear to auscultation bilaterally, no wheezing, rhonchi or rales Abd: soft, nontender, positive bowel sounds all quadrants, no hepatosplenomegaly Ext: no lower extremity edema.  2+ radial and dorsalis pedis pulses. Skin: warm and dry Neuro:  Grossly  normal  EKG:  Sinus rhythm with a rate of 92 bpm. This of small inferior Q waves.  ASSESSMENT AND PLAN:  Problem List Items Addressed This Visit    Obesity (BMI 30.0-34.9)    We discussed low carb diet and decreasing the number of vegetables he eats.      Left hip pain    Been evaluated with x-ray at the [facility] previously. They apparently found no skeletal issues. Need vascular workup. Does have 2+ posterior tibialis and dorsalis pedis pulses      Essential hypertension    Blood pressure is well-controlled. He is on losartan, atenolol. No changes.      Coronary artery disease involving native coronary artery of native heart with angina pectoris    Status post coronary angiogram and Promus Premier stenting of OM2 and PLA.  He is on aspirin Plavix total wall and statin.      Chest pain - Primary    There is no other significant stenosis indicated was angiogram. His pain sounds noncardiac he doesn't even notice it when he is exerting himself. Asked him to double up on his Nexium for a week and see if that makes a difference. There is no acute changes on his EKG.

## 2014-07-07 NOTE — Assessment & Plan Note (Signed)
Status post coronary angiogram and Promus Premier stenting of OM2 and PLA.  He is on aspirin Plavix total wall and statin.

## 2014-08-02 ENCOUNTER — Encounter: Payer: Self-pay | Admitting: Cardiovascular Disease

## 2014-08-03 ENCOUNTER — Encounter: Payer: Self-pay | Admitting: Physician Assistant

## 2014-08-04 ENCOUNTER — Other Ambulatory Visit: Payer: Self-pay | Admitting: Physician Assistant

## 2014-08-04 MED ORDER — ISOSORBIDE MONONITRATE ER 30 MG PO TB24: 30.0000 mg | ORAL_TABLET | Freq: Every day | ORAL | Status: DC

## 2014-08-11 ENCOUNTER — Emergency Department (HOSPITAL_COMMUNITY)
Admission: EM | Admit: 2014-08-11 | Discharge: 2014-08-12 | Disposition: A | Discharge status: Home / Self Care | Payer: Commercial Managed Care - HMO | Attending: Emergency Medicine | Admitting: Emergency Medicine

## 2014-08-11 ENCOUNTER — Encounter (HOSPITAL_COMMUNITY): Payer: Self-pay | Admitting: Emergency Medicine

## 2014-08-11 ENCOUNTER — Emergency Department (HOSPITAL_COMMUNITY): Payer: Commercial Managed Care - HMO

## 2014-08-11 DIAGNOSIS — Z9889 Other specified postprocedural states: Secondary | ICD-10-CM | POA: Diagnosis not present

## 2014-08-11 DIAGNOSIS — I2510 Atherosclerotic heart disease of native coronary artery without angina pectoris: Secondary | ICD-10-CM | POA: Diagnosis not present

## 2014-08-11 DIAGNOSIS — Z7982 Long term (current) use of aspirin: Secondary | ICD-10-CM | POA: Diagnosis not present

## 2014-08-11 DIAGNOSIS — R0602 Shortness of breath: Secondary | ICD-10-CM | POA: Insufficient documentation

## 2014-08-11 DIAGNOSIS — I10 Essential (primary) hypertension: Secondary | ICD-10-CM | POA: Diagnosis not present

## 2014-08-11 DIAGNOSIS — K219 Gastro-esophageal reflux disease without esophagitis: Secondary | ICD-10-CM | POA: Insufficient documentation

## 2014-08-11 DIAGNOSIS — Z87891 Personal history of nicotine dependence: Secondary | ICD-10-CM | POA: Diagnosis not present

## 2014-08-11 DIAGNOSIS — R0789 Other chest pain: Secondary | ICD-10-CM | POA: Diagnosis not present

## 2014-08-11 DIAGNOSIS — E669 Obesity, unspecified: Secondary | ICD-10-CM | POA: Diagnosis not present

## 2014-08-11 DIAGNOSIS — R7989 Other specified abnormal findings of blood chemistry: Secondary | ICD-10-CM | POA: Insufficient documentation

## 2014-08-11 DIAGNOSIS — R079 Chest pain, unspecified: Secondary | ICD-10-CM

## 2014-08-11 DIAGNOSIS — E780 Pure hypercholesterolemia: Secondary | ICD-10-CM | POA: Insufficient documentation

## 2014-08-11 DIAGNOSIS — Z880 Allergy status to penicillin: Secondary | ICD-10-CM | POA: Insufficient documentation

## 2014-08-11 DIAGNOSIS — Z79899 Other long term (current) drug therapy: Secondary | ICD-10-CM | POA: Diagnosis not present

## 2014-08-11 DIAGNOSIS — Z9861 Coronary angioplasty status: Secondary | ICD-10-CM | POA: Diagnosis not present

## 2014-08-11 LAB — CBC
HCT: 44.6 % (ref 39.0–52.0)
Hemoglobin: 15.1 g/dL (ref 13.0–17.0)
MCH: 30.9 pg (ref 26.0–34.0)
MCHC: 33.9 g/dL (ref 30.0–36.0)
MCV: 91.2 fL (ref 78.0–100.0)
Platelets: 253 10*3/uL (ref 150–400)
RBC: 4.89 MIL/uL (ref 4.22–5.81)
RDW: 12.4 % (ref 11.5–15.5)
WBC: 6.2 10*3/uL (ref 4.0–10.5)

## 2014-08-11 LAB — BASIC METABOLIC PANEL
ANION GAP: 7 (ref 5–15)
BUN: 13 mg/dL (ref 6–23)
CALCIUM: 9.4 mg/dL (ref 8.4–10.5)
CO2: 32 mmol/L (ref 19–32)
Chloride: 103 mmol/L (ref 96–112)
Creatinine, Ser: 0.98 mg/dL (ref 0.50–1.35)
GFR calc Af Amer: >90 mL/min (ref >90)
GFR calc non Af Amer: 85 mL/min — ABNORMAL LOW (ref >90)
GLUCOSE: 153 mg/dL — AB (ref 70–99)
POTASSIUM: 3.7 mmol/L (ref 3.5–5.1)
Sodium: 142 mmol/L (ref 135–145)

## 2014-08-11 LAB — I-STAT TROPONIN, ED
TROPONIN I, POC: 0 ng/mL (ref 0.00–0.08)
Troponin i, poc: 0 ng/mL (ref 0.00–0.08)

## 2014-08-11 LAB — BRAIN NATRIURETIC PEPTIDE: B Natriuretic Peptide: 106.2 pg/mL — ABNORMAL HIGH (ref 0.0–100.0)

## 2014-08-11 NOTE — ED Notes (Signed)
Resident at bedside.  

## 2014-08-11 NOTE — ED Notes (Signed)
Patient with chest pain that started this afternoon.  Patient still having chest pain and started feeling weak.  Patient tried to lay down but the pain persisted.  Pain is in the center to left of chest and states when he moves or turns his pain increases.  Patient does have some shortness of breath with the pain.  Patient states he does have some nausea but no vomiting.

## 2014-08-11 NOTE — ED Provider Notes (Signed)
CSN: [id number]     Arrival date & time 08/11/14  2034 History   First MD Initiated Contact with Patient 08/11/14 2052     Chief Complaint  Patient presents with  . Chest Pain   (Consider location/radiation/quality/duration/timing/severity/associated sxs/prior Treatment) HPI Comments: 64 yo M hx of CAD s/p PCI (last was 06/08/14), HTN, HLD, GERD, hx of carotid endarterectomy, presents with CC CP.    Patient is a 64 y.o. male presenting with chest pain. The history is provided by the patient. No language interpreter was used.  Chest Pain Pain location:  L chest Pain quality: tightness   Pain radiates to:  Does not radiate Pain radiates to the back: no   Pain severity:  Mild Onset quality:  Sudden Timing:  Intermittent Progression:  Resolved Chronicity:  Recurrent Context: movement and at rest   Context: not breathing, not eating, not lifting, not raising an arm, no stress and no trauma   Relieved by:  Rest Worsened by:  Movement Ineffective treatments:  None tried Associated symptoms: shortness of breath   Associated symptoms: no abdominal pain, no altered mental status, no anxiety, no back pain, no cough, no diaphoresis, no fever, no heartburn, no lower extremity edema, no nausea, no near-syncope, no numbness, no palpitations, not vomiting and no weakness   Shortness of breath:    Severity:  Mild   Onset quality:  Sudden   Duration: Minutes.   Timing:  Intermittent   Progression:  Resolved Risk factors: coronary artery disease, high cholesterol, hypertension, male sex and obesity   Risk factors: no aortic disease, no diabetes mellitus, no Ehlers-Danlos syndrome, no immobilization, no Marfan's syndrome, no prior DVT/PE, no smoking and no surgery     Past Medical History  Diagnosis Date  . Hypertension   . Obesity   . High cholesterol   . Coronary artery disease   . GERD (gastroesophageal reflux disease)   . H/O carotid endarterectomy    Past Surgical History  Procedure  Laterality Date  . Coronary angioplasty with stent placement    . Carotid endarterectomy    . Left heart catheterization with coronary angiogram N/A 06/08/2014    Procedure: LEFT HEART CATHETERIZATION WITH CORONARY ANGIOGRAM;  Surgeon: Blane Ohara, MD;  Location: [facility] CATH LAB;  Service: Cardiovascular;  Laterality: N/A;   No family history on file. History  Substance Use Topics  . Smoking status: Former Smoker    Quit date: 06/05/2012  . Smokeless tobacco: Not on file  . Alcohol Use: No    Review of Systems  Constitutional: Negative for fever and diaphoresis.  Respiratory: Positive for shortness of breath. Negative for cough.   Cardiovascular: Positive for chest pain. Negative for palpitations, leg swelling and near-syncope.  Gastrointestinal: Negative for heartburn, nausea, vomiting and abdominal pain.  Musculoskeletal: Negative for myalgias and back pain.  Skin: Negative for rash.  Neurological: Negative for weakness and numbness.  Hematological: Negative for adenopathy. Does not bruise/bleed easily.  All other systems reviewed and are negative.     Allergies  Penicillins  Home Medications   Prior to Admission medications   Medication Sig Start Date End Date Taking? Authorizing Provider  aspirin EC 81 MG tablet Take 81 mg by mouth daily.    Historical Provider, MD  atenolol (TENORMIN) 50 MG tablet Take 50 mg by mouth daily.    Historical Provider, MD  calcium carbonate (OS-CAL - DOSED IN MG OF ELEMENTAL CALCIUM) 1250 MG tablet Take 500 mg by mouth daily.  Historical Provider, MD  CALCIUM CITRATE PO Take 1 tablet by mouth daily.     Historical Provider, MD  Cholecalciferol (VITAMIN D3) 1000 UNITS CAPS Take 1,000 Units by mouth daily.     Historical Provider, MD  clopidogrel (PLAVIX) 75 MG tablet Take 75 mg by mouth daily.    Historical Provider, MD  co-enzyme Q-10 50 MG capsule Take 300 mg by mouth daily.    Historical Provider, MD  esomeprazole (NEXIUM) 20 MG  capsule Take 20 mg by mouth daily at 12 noon.    Historical Provider, MD  isosorbide mononitrate (IMDUR) 30 MG 24 hr tablet Take 1 tablet (30 mg total) by mouth daily. 08/04/14   Brett Canales, PA-C  losartan (COZAAR) 50 MG tablet Take 50 mg by mouth daily.    Historical Provider, MD  Multiple Vitamins-Minerals (CENTRUM SILVER PO) Take 1 tablet by mouth daily.    Historical Provider, MD  Omega-3 Fatty Acids (OMEGA-3 PO) Take 1 tablet by mouth daily.    Historical Provider, MD  omeprazole (PRILOSEC) 20 MG capsule Take 20 mg by mouth daily.    Historical Provider, MD  rosuvastatin (CRESTOR) 10 MG tablet Take 5 mg by mouth daily.    Historical Provider, MD   There were no vitals taken for this visit. Physical Exam  Constitutional: He is oriented to person, place, and time. He appears well-developed and well-nourished.  HENT:  Head: Normocephalic and atraumatic.  Right Ear: External ear normal.  Left Ear: External ear normal.  Mouth/Throat: Oropharynx is clear and moist.  Eyes: Conjunctivae and EOM are normal. Pupils are equal, round, and reactive to light.  Neck: Normal range of motion.  Cardiovascular: Normal rate, regular rhythm, normal heart sounds and intact distal pulses.   Pulmonary/Chest: Effort normal and breath sounds normal. No respiratory distress. He has no wheezes. He has no rales. He exhibits no tenderness.  Abdominal: Soft. Bowel sounds are normal. He exhibits no distension and no mass. There is no tenderness. There is no rebound and no guarding.  Musculoskeletal: Normal range of motion.  Neurological: He is alert and oriented to person, place, and time.  Skin: Skin is warm and dry.  Nursing note and vitals reviewed.   ED Course  Procedures (including critical care time) Labs Review Labs Reviewed  BASIC METABOLIC PANEL - Abnormal; Notable for the following:    Glucose, Bld 153 (*)    GFR calc non Af Amer 85 (*)    All other components within normal limits  BRAIN  NATRIURETIC PEPTIDE - Abnormal; Notable for the following:    B Natriuretic Peptide 106.2 (*)    All other components within normal limits  CBC  I-STAT TROPOININ, ED  [name], ED    Imaging Review Dg Chest 2 View  08/11/2014   CLINICAL DATA:  Chest pain and difficulty breathing  EXAM: CHEST  2 VIEW  COMPARISON:  June 05, 2014  FINDINGS: There is a probable nipple shadow on the right. Lungs elsewhere are clear. Heart size and pulmonary vascularity are normal. No adenopathy. No pneumothorax. No bone lesions.  IMPRESSION: Probable nipple shadow on the right. Suggest repeat study with nipple markers to confirm. No edema or consolidation.   Electronically Signed   By: Lowella Grip III M.D.   On: 08/11/2014 21:47     EKG Interpretation   Date/Time:  Tuesday August 11 2014 20:37:28 EST Ventricular Rate:  89 PR Interval:  142 QRS Duration: 92 QT Interval:  382 QTC Calculation:  464 R Axis:   62 Text Interpretation:  Normal sinus rhythm Nonspecific ST abnormality No  significant change since last tracing Confirmed by STEINL  MD, Lennette Bihari  ([id number]) on 08/11/2014 10:04:35 PM      MDM   Final diagnoses:  Chest pain, unspecified chest pain type   64 yo M hx of CAD s/p PCI (last was 06/08/14), HTN, HLD, GERD, hx of carotid endarterectomy, presents with CC CP.    Physical exam as above.   VS WNL.  Pt currently asymptomatic.  EKG without significant change from prior.  CXR unremarkable.    Labs demonstrate Troponin 0.00, CBC WNL, BMP WNL, BNP mild elevation 106.    Pt with atypical chest pain, lasting only a few seconds, resolving on own.  Likely stable angina.  Will repeat delta troponin, if negative and pt still asymptomatic, will be able to be d/c with cardiology follow up.    Delta troponin was 0.00.  Pt still remains asymptomatic on reeval.   Diagnosis of chest pain, likely stable angina.   D/c home in good condition.  F/u with cardiology in 1 week.  Return precautions  given.  Pt understands and agrees with plan.   Sinda Du  Discussed pt with Dr. Ashok Cordia.    Sinda Du, MD 08/12/14 [id number]  Mirna Mires, MD 08/13/14 [phone]

## 2014-08-12 NOTE — Discharge Instructions (Signed)

## 2014-08-18 ENCOUNTER — Encounter: Payer: Self-pay | Admitting: Cardiovascular Disease

## 2014-08-18 ENCOUNTER — Ambulatory Visit (INDEPENDENT_AMBULATORY_CARE_PROVIDER_SITE_OTHER): Payer: Commercial Managed Care - HMO | Admitting: Cardiovascular Disease

## 2014-08-18 VITALS — BP 176/86 | HR 84 | Ht 68.5 in | Wt 208.0 lb

## 2014-08-18 DIAGNOSIS — I739 Peripheral vascular disease, unspecified: Secondary | ICD-10-CM | POA: Diagnosis not present

## 2014-08-18 DIAGNOSIS — Z955 Presence of coronary angioplasty implant and graft: Secondary | ICD-10-CM

## 2014-08-18 DIAGNOSIS — Z79899 Other long term (current) drug therapy: Secondary | ICD-10-CM

## 2014-08-18 DIAGNOSIS — I779 Disorder of arteries and arterioles, unspecified: Secondary | ICD-10-CM

## 2014-08-18 DIAGNOSIS — E785 Hyperlipidemia, unspecified: Secondary | ICD-10-CM | POA: Diagnosis not present

## 2014-08-18 DIAGNOSIS — I10 Essential (primary) hypertension: Secondary | ICD-10-CM

## 2014-08-18 DIAGNOSIS — R0989 Other specified symptoms and signs involving the circulatory and respiratory systems: Secondary | ICD-10-CM | POA: Diagnosis not present

## 2014-08-18 MED ORDER — LOSARTAN POTASSIUM 100 MG PO TABS: 100.0000 mg | ORAL_TABLET | Freq: Every day | ORAL | Status: DC

## 2014-08-18 MED ORDER — ATENOLOL 50 MG PO TABS
50.0000 mg | ORAL_TABLET | Freq: Every day | ORAL | Status: DC
Start: 2014-08-18 — End: 2014-10-05

## 2014-08-18 MED ORDER — OMEPRAZOLE 20 MG PO CPDR: 20.0000 mg | DELAYED_RELEASE_CAPSULE | Freq: Two times a day (BID) | ORAL | Status: DC

## 2014-08-18 MED ORDER — ROSUVASTATIN CALCIUM 10 MG PO TABS: 5.0000 mg | ORAL_TABLET | Freq: Every day | ORAL | Status: DC

## 2014-08-18 NOTE — Assessment & Plan Note (Signed)
History of CAD status post remote stenting of his coronary arteries at [place] 2 years ago with recent admission for chest pain and successful stenting of an obtuse marginal branch and PLA using Promus Premier drug-eluting stents by Dr. Burt Knack. He was complaining of some chest pain and was begun on long-acting nitrate by Tenny Craw which resulted in some improvement.

## 2014-08-18 NOTE — Patient Instructions (Addendum)
  We will see you back in follow up in 3 months with Dr Gwenlyn Found.   Dr Gwenlyn Found has ordered: 1. Carotid Duplex- This test is an ultrasound of the carotid arteries in your neck. It looks at blood flow through these arteries that supply the brain with blood. Allow one hour for this exam. There are no restrictions or special instructions.  2. lower extremity arterial doppler- During this test, ultrasound is used to evaluate arterial blood flow in the legs. Allow approximately one hour for this exam.   3. A FASTING lipid profile: to be done at your convenience.  There is a Psychologist, forensic lab on the first floor of this building, [address].  They are open from 8am-5pm with a lunch from 12-2.  You do not need an appointment.    4. Increase to losartan 100mg  daily

## 2014-08-18 NOTE — Progress Notes (Signed)
08/18/2014 Reginald Little   [birth year]  [id number]  Primary Physician Reginald Argyle, MD Primary Cardiologist: Reginald Harp MD Reginald Little   HPI:  Reginald Little is a 64 year old mildly overweight divorced Caucasian male with history of CAD status post stenting of his coronary arteries 2 years ago at [facility] using drug-eluting stents. His history is also remarkable for remote tobacco abuse, treated hypertension and hyperlipidemia. He had a left carotid endarterectomy performed at the [facility] in [place] number of years ago as well. He was admitted to the hospital in December with chest pain. A Myoview stress test was high risk and he underwent cardiac catheterization by Dr. Burt Little stenting to coronary arteries using drug-eluting stents. He had some chest pain since which is improved with the addition of long-acting oral nitrate. He does complain of left hip claudication as well.   Current Outpatient Prescriptions  Medication Sig Dispense Refill  . aspirin EC 81 MG tablet Take 81 mg by mouth daily.    Marland Kitchen atenolol (TENORMIN) 50 MG tablet Take 1 tablet (50 mg total) by mouth daily. 90 tablet 3  . Cholecalciferol (VITAMIN D3) 1000 UNITS CAPS Take 2,000 Units by mouth daily.     . clopidogrel (PLAVIX) 75 MG tablet Take 75 mg by mouth daily.    Marland Kitchen co-enzyme Q-10 50 MG capsule Take 300 mg by mouth daily.    . isosorbide mononitrate (IMDUR) 30 MG 24 hr tablet Take 1 tablet (30 mg total) by mouth daily. 90 tablet 3  . losartan (COZAAR) 100 MG tablet Take 1 tablet (100 mg total) by mouth daily. 90 tablet 3  . Multiple Vitamins-Minerals (CENTRUM SILVER PO) Take 1 tablet by mouth daily.    . Omega-3 Fatty Acids (OMEGA-3 PO) Take 1,000 mg by mouth daily.     Marland Kitchen omeprazole (PRILOSEC) 20 MG capsule Take 1 capsule (20 mg total) by mouth 2 (two) times daily before a meal. 180 capsule 3  . rosuvastatin (CRESTOR) 10 MG tablet Take 0.5 tablets (5 mg total) by mouth  daily. 45 tablet 3   No current facility-administered medications for this visit.    Allergies  Allergen Reactions  . Penicillins Rash    History   Social History  . Marital Status: Divorced    Spouse Name: N/A  . Number of Children: N/A  . Years of Education: N/A   Occupational History  . Not on file.   Social History Main Topics  . Smoking status: Former Smoker    Quit date: 06/05/2012  . Smokeless tobacco: Not on file  . Alcohol Use: No  . Drug Use: No  . Sexual Activity: Not on file   Other Topics Concern  . Not on file   Social History Narrative     Review of Systems: General: negative for chills, fever, night sweats or weight changes.  Cardiovascular: negative for chest pain, dyspnea on exertion, edema, orthopnea, palpitations, paroxysmal nocturnal dyspnea or shortness of breath Dermatological: negative for rash Respiratory: negative for cough or wheezing Urologic: negative for hematuria Abdominal: negative for nausea, vomiting, diarrhea, bright red blood per rectum, melena, or hematemesis Neurologic: negative for visual changes, syncope, or dizziness All other systems reviewed and are otherwise negative except as noted above.    Blood pressure 176/86, pulse 84, height 5' 8.5" (1.74 m), weight 208 lb (94.348 kg).  General appearance: alert and no distress Neck: no adenopathy, no JVD, supple, symmetrical, trachea midline, thyroid not enlarged, symmetric, no  tenderness/mass/nodules and left carotid bruit Lungs: clear to auscultation bilaterally Heart: regular rate and rhythm, S1, S2 normal, no murmur, click, rub or gallop Extremities: extremities normal, atraumatic, no cyanosis or edema  EKG not performed today  ASSESSMENT AND PLAN:   Stented coronary artery History of CAD status post remote stenting of his coronary arteries at [place] 2 years ago with recent admission for chest pain and successful stenting of an obtuse marginal branch and PLA using Promus  Premier drug-eluting stents by Dr. Burt Little. He was complaining of some chest pain and was begun on long-acting nitrate by Reginald Little which resulted in some improvement.   Essential hypertension History of hypertension blood pressure measured at 176/86. He is on atenolol, and losartan. Continue current meds at current dosing   Hyperlipidemia History of hyperlipidemia on Crestor 10 mg a day. We will recheck a lipid liver profile   Carotid artery disease History of left carotid endarterectomy at the [facility] in [place] remotely with TIAs prior to that. Does have a left carotid bruit. I'm going to check carotid Doppler studies   Claudication History of left hip claudication. I'm going to get lower extremity arterial Doppler studies       Reginald Harp MD [facility], [facility] 08/18/2014 5:48 PM

## 2014-08-18 NOTE — Assessment & Plan Note (Signed)
History of hypertension blood pressure measured at 176/86. He is on atenolol, and losartan. Continue current meds at current dosing

## 2014-08-18 NOTE — Assessment & Plan Note (Signed)
History of hyperlipidemia on Crestor 10 mg a day. We will recheck a lipid liver profile

## 2014-08-18 NOTE — Assessment & Plan Note (Signed)
History of left carotid endarterectomy at the [place] in [place] remotely with TIAs prior to that. Does have a left carotid bruit. I'm going to check carotid Doppler studies

## 2014-08-18 NOTE — Assessment & Plan Note (Signed)
History of left hip claudication. I'm going to get lower extremity arterial Doppler studies

## 2014-08-19 ENCOUNTER — Encounter: Payer: Self-pay | Admitting: Cardiovascular Disease

## 2014-08-19 DIAGNOSIS — E785 Hyperlipidemia, unspecified: Secondary | ICD-10-CM | POA: Diagnosis not present

## 2014-08-19 DIAGNOSIS — Z79899 Other long term (current) drug therapy: Secondary | ICD-10-CM | POA: Diagnosis not present

## 2014-08-19 LAB — HEPATIC FUNCTION PANEL
ALBUMIN: 4.1 g/dL (ref 3.5–5.2)
ALK PHOS: 82 U/L (ref 39–117)
ALT: 44 U/L (ref 0–53)
AST: 28 U/L (ref 0–37)
BILIRUBIN INDIRECT: 0.9 mg/dL (ref 0.2–1.2)
Bilirubin, Direct: 0.2 mg/dL (ref 0.0–0.3)
TOTAL PROTEIN: 6.8 g/dL (ref 6.0–8.3)
Total Bilirubin: 1.1 mg/dL (ref 0.2–1.2)

## 2014-08-19 LAB — LIPID PANEL
CHOLESTEROL: 122 mg/dL (ref 0–200)
HDL: 33 mg/dL — ABNORMAL LOW (ref >=40)
LDL CALC: 68 mg/dL (ref 0–99)
Total CHOL/HDL Ratio: 3.7 Ratio
Triglycerides: 103 mg/dL (ref <150)
VLDL: 21 mg/dL (ref 0–40)

## 2014-08-24 ENCOUNTER — Ambulatory Visit (HOSPITAL_BASED_OUTPATIENT_CLINIC_OR_DEPARTMENT_OTHER)
Admission: RE | Admit: 2014-08-24 | Discharge: 2014-08-24 | Disposition: A | Discharge status: Home / Self Care | Payer: Commercial Managed Care - HMO | Source: Ambulatory Visit | Attending: Cardiovascular Disease | Admitting: Cardiovascular Disease

## 2014-08-24 ENCOUNTER — Ambulatory Visit (HOSPITAL_COMMUNITY)
Admission: RE | Admit: 2014-08-24 | Discharge: 2014-08-24 | Disposition: A | Discharge status: Home / Self Care | Payer: Commercial Managed Care - HMO | Source: Ambulatory Visit | Attending: Cardiology | Admitting: Cardiology

## 2014-08-24 DIAGNOSIS — R0989 Other specified symptoms and signs involving the circulatory and respiratory systems: Secondary | ICD-10-CM | POA: Diagnosis not present

## 2014-08-24 DIAGNOSIS — I739 Peripheral vascular disease, unspecified: Secondary | ICD-10-CM | POA: Insufficient documentation

## 2014-08-24 NOTE — Progress Notes (Signed)
Lower Ext. Arterial Duplex Completed. Marise Knapper, BS, RDMS, RVT  

## 2014-08-24 NOTE — Progress Notes (Signed)
Carotid Duplex Completed. Damien Cisar, BS, RDMS, RVT  

## 2014-09-10 ENCOUNTER — Encounter (HOSPITAL_COMMUNITY)
Admission: RE | Admit: 2014-09-10 | Discharge: 2014-09-10 | Disposition: A | Discharge status: Home / Self Care | Payer: Commercial Managed Care - HMO | Source: Ambulatory Visit | Attending: Cardiovascular Disease | Admitting: Cardiovascular Disease

## 2014-09-10 NOTE — Progress Notes (Signed)
Cardiac Rehab Medication Review by a Pharmacist  Does the patient  feel that his/her medications are working for him/her?  yes  Has the patient been experiencing any side effects to the medications prescribed?  no  Does the patient measure his/her own blood pressure or blood glucose at home?  Yes. BP runs 110-130 / 65-76.  Does the patient have any problems obtaining medications due to transportation or finances?   no  Understanding of regimen: good Understanding of indications: good Potential of compliance: good   Pharmacist comments: Pt reports compliance with all medications and has not further issues.   Reginia Naas 09/10/2014 8:46 AM

## 2014-09-14 ENCOUNTER — Encounter (HOSPITAL_COMMUNITY): Payer: Self-pay

## 2014-09-14 ENCOUNTER — Encounter (HOSPITAL_COMMUNITY)
Admission: RE | Admit: 2014-09-14 | Discharge: 2014-09-14 | Disposition: A | Discharge status: Home / Self Care | Payer: Commercial Managed Care - HMO | Source: Ambulatory Visit | Attending: Cardiovascular Disease | Admitting: Cardiovascular Disease

## 2014-09-14 DIAGNOSIS — Z955 Presence of coronary angioplasty implant and graft: Secondary | ICD-10-CM | POA: Insufficient documentation

## 2014-09-14 NOTE — Progress Notes (Signed)
Pt started cardiac rehab today.  Pt tolerated light exercise without difficulty. VSS, telemetry-sinus rhythm, asymptomatic.  PHQ-0. Pt exhibits positive coping skills, hopeful outlook with supportive family. Pt enjoys playing golfing, walking dog in park which he has recently been unable to do r/t hip pain.  Pt is seeking relief from hip pain and is hopeful cardiac rehab activity can offer some relief from this pain.    Pt cardiac rehab goals are to decrease hip pain and lose weight.  Pt interventions for these goals are participation in cardiac rehab exercise including warm up and cool down stretches, attend functional fitness and stretching education classes, pt given information about nutritional education classes for weight loss suggestions.  Pt oriented to exercise equipment and routine.  Understanding verbalized.

## 2014-09-16 ENCOUNTER — Encounter (HOSPITAL_COMMUNITY)
Admission: RE | Admit: 2014-09-16 | Discharge: 2014-09-16 | Disposition: A | Discharge status: Home / Self Care | Payer: Commercial Managed Care - HMO | Source: Ambulatory Visit | Attending: Cardiovascular Disease | Admitting: Cardiovascular Disease

## 2014-09-16 DIAGNOSIS — Z955 Presence of coronary angioplasty implant and graft: Secondary | ICD-10-CM | POA: Diagnosis not present

## 2014-09-18 ENCOUNTER — Encounter (HOSPITAL_COMMUNITY)
Admission: RE | Admit: 2014-09-18 | Discharge: 2014-09-18 | Disposition: A | Discharge status: Home / Self Care | Payer: Commercial Managed Care - HMO | Source: Ambulatory Visit | Attending: Cardiovascular Disease | Admitting: Cardiovascular Disease

## 2014-09-18 DIAGNOSIS — Z955 Presence of coronary angioplasty implant and graft: Secondary | ICD-10-CM | POA: Diagnosis not present

## 2014-09-21 ENCOUNTER — Encounter (HOSPITAL_COMMUNITY)
Admission: RE | Admit: 2014-09-21 | Discharge: 2014-09-21 | Disposition: A | Discharge status: Home / Self Care | Payer: Commercial Managed Care - HMO | Source: Ambulatory Visit | Attending: Cardiovascular Disease | Admitting: Cardiovascular Disease

## 2014-09-21 DIAGNOSIS — Z955 Presence of coronary angioplasty implant and graft: Secondary | ICD-10-CM | POA: Diagnosis not present

## 2014-09-23 ENCOUNTER — Encounter (HOSPITAL_COMMUNITY)
Admission: RE | Admit: 2014-09-23 | Discharge: 2014-09-23 | Disposition: A | Discharge status: Home / Self Care | Payer: Commercial Managed Care - HMO | Source: Ambulatory Visit | Attending: Cardiovascular Disease | Admitting: Cardiovascular Disease

## 2014-09-23 DIAGNOSIS — Z955 Presence of coronary angioplasty implant and graft: Secondary | ICD-10-CM | POA: Diagnosis not present

## 2014-09-25 ENCOUNTER — Encounter (HOSPITAL_COMMUNITY)
Admission: RE | Admit: 2014-09-25 | Discharge: 2014-09-25 | Disposition: A | Discharge status: Home / Self Care | Payer: Commercial Managed Care - HMO | Source: Ambulatory Visit | Attending: Cardiovascular Disease | Admitting: Cardiovascular Disease

## 2014-09-25 DIAGNOSIS — Z955 Presence of coronary angioplasty implant and graft: Secondary | ICD-10-CM | POA: Diagnosis not present

## 2014-09-28 ENCOUNTER — Encounter (HOSPITAL_COMMUNITY): Payer: Commercial Managed Care - HMO

## 2014-09-28 ENCOUNTER — Encounter (HOSPITAL_COMMUNITY)
Admission: RE | Admit: 2014-09-28 | Discharge: 2014-09-28 | Disposition: A | Discharge status: Home / Self Care | Payer: Commercial Managed Care - HMO | Source: Ambulatory Visit | Attending: Cardiovascular Disease | Admitting: Cardiovascular Disease

## 2014-09-28 DIAGNOSIS — Z955 Presence of coronary angioplasty implant and graft: Secondary | ICD-10-CM | POA: Diagnosis not present

## 2014-09-28 NOTE — Progress Notes (Addendum)
Pt transferred to 11:15 class Cardiac Rehab phase II  per his request. Maurice Small RN

## 2014-09-30 ENCOUNTER — Encounter (HOSPITAL_COMMUNITY): Payer: Commercial Managed Care - HMO

## 2014-09-30 ENCOUNTER — Encounter (HOSPITAL_COMMUNITY)
Admission: RE | Admit: 2014-09-30 | Discharge: 2014-09-30 | Disposition: A | Discharge status: Home / Self Care | Payer: Commercial Managed Care - HMO | Source: Ambulatory Visit | Attending: Cardiovascular Disease | Admitting: Cardiovascular Disease

## 2014-09-30 DIAGNOSIS — Z955 Presence of coronary angioplasty implant and graft: Secondary | ICD-10-CM | POA: Diagnosis not present

## 2014-10-02 ENCOUNTER — Encounter (HOSPITAL_COMMUNITY)
Admission: RE | Admit: 2014-10-02 | Discharge: 2014-10-02 | Disposition: A | Discharge status: Home / Self Care | Payer: Commercial Managed Care - HMO | Source: Ambulatory Visit | Attending: Cardiovascular Disease | Admitting: Cardiovascular Disease

## 2014-10-02 ENCOUNTER — Encounter (HOSPITAL_COMMUNITY): Payer: Commercial Managed Care - HMO

## 2014-10-02 DIAGNOSIS — Z955 Presence of coronary angioplasty implant and graft: Secondary | ICD-10-CM | POA: Diagnosis not present

## 2014-10-05 ENCOUNTER — Emergency Department (HOSPITAL_COMMUNITY): Payer: Commercial Managed Care - HMO

## 2014-10-05 ENCOUNTER — Other Ambulatory Visit (HOSPITAL_COMMUNITY): Payer: Self-pay

## 2014-10-05 ENCOUNTER — Inpatient Hospital Stay (HOSPITAL_COMMUNITY)
Admission: EM | Admit: 2014-10-05 | Discharge: 2014-10-06 | DRG: 287 | Disposition: A | Discharge status: Home / Self Care | Payer: Commercial Managed Care - HMO | Attending: Cardiovascular Disease | Admitting: Cardiovascular Disease

## 2014-10-05 ENCOUNTER — Telehealth: Payer: Self-pay | Admitting: Cardiovascular Disease

## 2014-10-05 ENCOUNTER — Encounter (HOSPITAL_COMMUNITY): Payer: Commercial Managed Care - HMO

## 2014-10-05 ENCOUNTER — Other Ambulatory Visit: Payer: Self-pay

## 2014-10-05 ENCOUNTER — Encounter (HOSPITAL_COMMUNITY): Payer: Self-pay | Admitting: Emergency Medicine

## 2014-10-05 ENCOUNTER — Ambulatory Visit (HOSPITAL_COMMUNITY)
Admit: 2014-10-05 | Discharge: 2014-10-05 | Disposition: A | Discharge status: Home / Self Care | Payer: Commercial Managed Care - HMO

## 2014-10-05 ENCOUNTER — Other Ambulatory Visit: Payer: Self-pay | Admitting: Cardiovascular Disease

## 2014-10-05 ENCOUNTER — Encounter (HOSPITAL_COMMUNITY)
Admission: RE | Admit: 2014-10-05 | Discharge: 2014-10-05 | Disposition: A | Discharge status: Home / Self Care | Payer: Commercial Managed Care - HMO | Source: Ambulatory Visit | Attending: Cardiovascular Disease | Admitting: Cardiovascular Disease

## 2014-10-05 DIAGNOSIS — I25110 Atherosclerotic heart disease of native coronary artery with unstable angina pectoris: Secondary | ICD-10-CM | POA: Diagnosis not present

## 2014-10-05 DIAGNOSIS — Z87891 Personal history of nicotine dependence: Secondary | ICD-10-CM

## 2014-10-05 DIAGNOSIS — E669 Obesity, unspecified: Secondary | ICD-10-CM | POA: Diagnosis not present

## 2014-10-05 DIAGNOSIS — I208 Other forms of angina pectoris: Secondary | ICD-10-CM | POA: Insufficient documentation

## 2014-10-05 DIAGNOSIS — Z7902 Long term (current) use of antithrombotics/antiplatelets: Secondary | ICD-10-CM | POA: Diagnosis not present

## 2014-10-05 DIAGNOSIS — R0789 Other chest pain: Secondary | ICD-10-CM | POA: Diagnosis not present

## 2014-10-05 DIAGNOSIS — I779 Disorder of arteries and arterioles, unspecified: Secondary | ICD-10-CM | POA: Diagnosis present

## 2014-10-05 DIAGNOSIS — R0602 Shortness of breath: Secondary | ICD-10-CM | POA: Diagnosis not present

## 2014-10-05 DIAGNOSIS — R0609 Other forms of dyspnea: Secondary | ICD-10-CM

## 2014-10-05 DIAGNOSIS — R079 Chest pain, unspecified: Secondary | ICD-10-CM | POA: Diagnosis not present

## 2014-10-05 DIAGNOSIS — I739 Peripheral vascular disease, unspecified: Secondary | ICD-10-CM

## 2014-10-05 DIAGNOSIS — Z955 Presence of coronary angioplasty implant and graft: Secondary | ICD-10-CM | POA: Diagnosis not present

## 2014-10-05 DIAGNOSIS — E785 Hyperlipidemia, unspecified: Secondary | ICD-10-CM | POA: Diagnosis present

## 2014-10-05 DIAGNOSIS — Z9861 Coronary angioplasty status: Secondary | ICD-10-CM

## 2014-10-05 DIAGNOSIS — I2510 Atherosclerotic heart disease of native coronary artery without angina pectoris: Secondary | ICD-10-CM | POA: Insufficient documentation

## 2014-10-05 DIAGNOSIS — I10 Essential (primary) hypertension: Secondary | ICD-10-CM | POA: Diagnosis present

## 2014-10-05 DIAGNOSIS — K219 Gastro-esophageal reflux disease without esophagitis: Secondary | ICD-10-CM | POA: Diagnosis not present

## 2014-10-05 DIAGNOSIS — I200 Unstable angina: Secondary | ICD-10-CM

## 2014-10-05 DIAGNOSIS — Z79899 Other long term (current) drug therapy: Secondary | ICD-10-CM | POA: Diagnosis not present

## 2014-10-05 DIAGNOSIS — Z6830 Body mass index (BMI) 30.0-30.9, adult: Secondary | ICD-10-CM

## 2014-10-05 DIAGNOSIS — Z7982 Long term (current) use of aspirin: Secondary | ICD-10-CM

## 2014-10-05 HISTORY — DX: Hyperlipidemia, unspecified: E78.5

## 2014-10-05 LAB — TROPONIN I
Troponin I: <0.03 ng/mL (ref <0.031)
Troponin I: <0.03 ng/mL (ref <0.031)

## 2014-10-05 LAB — BASIC METABOLIC PANEL
Anion gap: 9 (ref 5–15)
BUN: 11 mg/dL (ref 6–23)
CO2: 29 mmol/L (ref 19–32)
CREATININE: 1.05 mg/dL (ref 0.50–1.35)
Calcium: 9 mg/dL (ref 8.4–10.5)
Chloride: 102 mmol/L (ref 96–112)
GFR, EST AFRICAN AMERICAN: 84 mL/min — AB (ref >90)
GFR, EST NON AFRICAN AMERICAN: 73 mL/min — AB (ref >90)
Glucose, Bld: 107 mg/dL — ABNORMAL HIGH (ref 70–99)
Potassium: 4.1 mmol/L (ref 3.5–5.1)
SODIUM: 140 mmol/L (ref 135–145)

## 2014-10-05 LAB — CBC WITH DIFFERENTIAL/PLATELET
BASOS ABS: 0.1 10*3/uL (ref 0.0–0.1)
Basophils Relative: 1 % (ref 0–1)
EOS PCT: 9 % — AB (ref 0–5)
Eosinophils Absolute: 0.5 10*3/uL (ref 0.0–0.7)
HCT: 40.6 % (ref 39.0–52.0)
Hemoglobin: 13.8 g/dL (ref 13.0–17.0)
LYMPHS PCT: 32 % (ref 12–46)
Lymphs Abs: 1.9 10*3/uL (ref 0.7–4.0)
MCH: 30.7 pg (ref 26.0–34.0)
MCHC: 34 g/dL (ref 30.0–36.0)
MCV: 90.4 fL (ref 78.0–100.0)
MONO ABS: 0.4 10*3/uL (ref 0.1–1.0)
Monocytes Relative: 7 % (ref 3–12)
NEUTROS PCT: 51 % (ref 43–77)
Neutro Abs: 3 10*3/uL (ref 1.7–7.7)
Platelets: 196 10*3/uL (ref 150–400)
RBC: 4.49 MIL/uL (ref 4.22–5.81)
RDW: 12.3 % (ref 11.5–15.5)
WBC: 5.8 10*3/uL (ref 4.0–10.5)

## 2014-10-05 LAB — PROTIME-INR
INR: 1.04 (ref 0.00–1.49)
PROTHROMBIN TIME: 13.7 s (ref 11.6–15.2)

## 2014-10-05 LAB — PLATELET INHIBITION P2Y12: Platelet Function  P2Y12: 132 [PRU] — ABNORMAL LOW (ref 194–418)

## 2014-10-05 LAB — BRAIN NATRIURETIC PEPTIDE: B Natriuretic Peptide: 53 pg/mL (ref 0.0–100.0)

## 2014-10-05 LAB — TSH: TSH: 1.219 u[IU]/mL (ref 0.350–4.500)

## 2014-10-05 MED ORDER — ALPRAZOLAM 0.25 MG PO TABS: 0.2500 mg | ORAL_TABLET | Freq: Two times a day (BID) | ORAL | Status: DC | PRN

## 2014-10-05 MED ORDER — ATENOLOL 50 MG PO TABS: 50.0000 mg | ORAL_TABLET | Freq: Every day | ORAL | Status: DC

## 2014-10-05 MED ORDER — ASPIRIN EC 81 MG PO TBEC
81.0000 mg | DELAYED_RELEASE_TABLET | Freq: Every day | ORAL | Status: DC
  Filled 2014-10-05: qty 1

## 2014-10-05 MED ORDER — HEPARIN (PORCINE) IN NACL 100-0.45 UNIT/ML-% IJ SOLN
1450.0000 [IU]/h | INTRAMUSCULAR | Status: DC
  Administered 2014-10-05: 1450 [IU]/h via INTRAVENOUS
  Filled 2014-10-05 (×5): qty 250

## 2014-10-05 MED ORDER — CLOPIDOGREL BISULFATE 75 MG PO TABS: 75.0000 mg | ORAL_TABLET | Freq: Every day | ORAL | Status: DC

## 2014-10-05 MED ORDER — ONDANSETRON HCL 4 MG/2ML IJ SOLN: 4.0000 mg | Freq: Four times a day (QID) | INTRAMUSCULAR | Status: DC | PRN

## 2014-10-05 MED ORDER — SODIUM CHLORIDE 0.9 % IJ SOLN: 3.0000 mL | Freq: Two times a day (BID) | INTRAMUSCULAR | Status: DC

## 2014-10-05 MED ORDER — SODIUM CHLORIDE 0.9 % IV SOLN
1.0000 mL/kg/h | INTRAVENOUS | Status: DC
  Administered 2014-10-05: 1 mL/kg/h via INTRAVENOUS

## 2014-10-05 MED ORDER — ISOSORBIDE MONONITRATE ER 30 MG PO TB24
30.0000 mg | ORAL_TABLET | Freq: Every day | ORAL | Status: DC
  Administered 2014-10-06: 10:00:00 30 mg via ORAL
  Filled 2014-10-05: qty 1

## 2014-10-05 MED ORDER — ASPIRIN EC 81 MG PO TBEC: 81.0000 mg | DELAYED_RELEASE_TABLET | Freq: Every day | ORAL | Status: DC

## 2014-10-05 MED ORDER — NITROGLYCERIN 0.4 MG SL SUBL: 0.4000 mg | SUBLINGUAL_TABLET | SUBLINGUAL | Status: DC | PRN

## 2014-10-05 MED ORDER — SODIUM CHLORIDE 0.9 % IJ SOLN
3.0000 mL | INTRAMUSCULAR | Status: DC | PRN
Start: 2014-10-05 — End: 2014-10-06

## 2014-10-05 MED ORDER — LORATADINE 10 MG PO TABS
10.0000 mg | ORAL_TABLET | Freq: Every day | ORAL | Status: DC | PRN
  Filled 2014-10-05: qty 1

## 2014-10-05 MED ORDER — VITAMIN D3 25 MCG (1000 UNIT) PO TABS
2000.0000 [IU] | ORAL_TABLET | Freq: Every day | ORAL | Status: DC
  Administered 2014-10-06: 10:00:00 2000 [IU] via ORAL
  Filled 2014-10-05: qty 2

## 2014-10-05 MED ORDER — CO-ENZYME Q-10 50 MG PO CAPS: 300.0000 mg | ORAL_CAPSULE | Freq: Every day | ORAL | Status: DC

## 2014-10-05 MED ORDER — ROSUVASTATIN CALCIUM 5 MG PO TABS
5.0000 mg | ORAL_TABLET | Freq: Every day | ORAL | Status: DC
  Filled 2014-10-05: qty 1

## 2014-10-05 MED ORDER — CLOPIDOGREL BISULFATE 75 MG PO TABS
75.0000 mg | ORAL_TABLET | Freq: Every day | ORAL | Status: DC
  Administered 2014-10-06: 08:00:00 75 mg via ORAL
  Filled 2014-10-05: qty 1

## 2014-10-05 MED ORDER — SODIUM CHLORIDE 0.9 % IV SOLN: 250.0000 mL | INTRAVENOUS | Status: DC | PRN

## 2014-10-05 MED ORDER — OMEGA-3 1000 MG PO CAPS: 1.0000 | ORAL_CAPSULE | Freq: Every day | ORAL | Status: DC

## 2014-10-05 MED ORDER — ACETAMINOPHEN 325 MG PO TABS: 650.0000 mg | ORAL_TABLET | ORAL | Status: DC | PRN

## 2014-10-05 MED ORDER — VITAMIN D3 25 MCG (1000 UT) PO CAPS: 2000.0000 [IU] | ORAL_CAPSULE | Freq: Every day | ORAL | Status: DC

## 2014-10-05 MED ORDER — ASPIRIN 81 MG PO CHEW
324.0000 mg | CHEWABLE_TABLET | Freq: Once | ORAL | Status: AC
  Administered 2014-10-05: 324 mg via ORAL
  Filled 2014-10-05: qty 4

## 2014-10-05 MED ORDER — ISOSORBIDE MONONITRATE ER 30 MG PO TB24: 30.0000 mg | ORAL_TABLET | Freq: Every day | ORAL | Status: DC

## 2014-10-05 MED ORDER — LOSARTAN POTASSIUM 100 MG PO TABS: 100.0000 mg | ORAL_TABLET | Freq: Every day | ORAL | Status: DC

## 2014-10-05 MED ORDER — PANTOPRAZOLE SODIUM 40 MG PO TBEC
40.0000 mg | DELAYED_RELEASE_TABLET | Freq: Every day | ORAL | Status: DC
Start: 2014-10-06 — End: 2014-10-06
  Administered 2014-10-06: 40 mg via ORAL
  Filled 2014-10-05: qty 1

## 2014-10-05 MED ORDER — ATENOLOL 50 MG PO TABS
50.0000 mg | ORAL_TABLET | Freq: Every day | ORAL | Status: DC
  Administered 2014-10-06: 50 mg via ORAL
  Filled 2014-10-05: qty 1

## 2014-10-05 MED ORDER — SODIUM CHLORIDE 0.9 % IJ SOLN: 3.0000 mL | INTRAMUSCULAR | Status: DC | PRN

## 2014-10-05 MED ORDER — PNEUMOCOCCAL VAC POLYVALENT 25 MCG/0.5ML IJ INJ
0.5000 mL | INJECTION | INTRAMUSCULAR | Status: AC
  Administered 2014-10-06: 0.5 mL via INTRAMUSCULAR
  Filled 2014-10-05: qty 0.5

## 2014-10-05 MED ORDER — ASPIRIN 81 MG PO CHEW
81.0000 mg | CHEWABLE_TABLET | ORAL | Status: AC
  Administered 2014-10-06: 08:00:00 81 mg via ORAL
  Filled 2014-10-05: qty 1

## 2014-10-05 MED ORDER — LOSARTAN POTASSIUM 50 MG PO TABS
100.0000 mg | ORAL_TABLET | Freq: Every day | ORAL | Status: DC
  Administered 2014-10-06: 100 mg via ORAL
  Filled 2014-10-05: qty 2

## 2014-10-05 MED ORDER — OMEGA-3-ACID ETHYL ESTERS 1 G PO CAPS
1.0000 g | ORAL_CAPSULE | Freq: Every day | ORAL | Status: DC
  Administered 2014-10-06: 1 g via ORAL
  Filled 2014-10-05: qty 1

## 2014-10-05 MED ORDER — ZOLPIDEM TARTRATE 5 MG PO TABS: 5.0000 mg | ORAL_TABLET | Freq: Every evening | ORAL | Status: DC | PRN

## 2014-10-05 MED ORDER — OMEPRAZOLE 20 MG PO CPDR: 20.0000 mg | DELAYED_RELEASE_CAPSULE | Freq: Two times a day (BID) | ORAL | Status: DC

## 2014-10-05 MED ORDER — HEPARIN BOLUS VIA INFUSION
4500.0000 [IU] | Freq: Once | INTRAVENOUS | Status: AC
  Administered 2014-10-05: 4500 [IU] via INTRAVENOUS
  Filled 2014-10-05: qty 4500

## 2014-10-05 NOTE — ED Provider Notes (Signed)
CSN: [id number]     Arrival date & time 10/05/14  1146 History   First MD Initiated Contact with Patient 10/05/14 1207     Chief Complaint  Patient presents with  . Chest Pain     (Consider location/radiation/quality/duration/timing/severity/associated sxs/prior Treatment) HPI Comments: Patient with history of CAD status post multiple stents presenting with 3 day history of exertional shortness of breath and dyspnea. States he stopped mowing the lawn due to shortness of breath which is something unusual. He's had intermittent chest discomfort since his last stents replaced in December. His cardiologist is aware of this. He is not having any chest pain currently. He denies any cough, fever, abdominal pain, nausea or vomiting. He is concerned that he is having more dyspnea and told him this a cardiac rehabilitation today and was referred to the ED. He took a cruise in March. Denies any leg pain or leg swelling. He is on aspirin and Plavix.  The history is provided by the patient. The history is limited by the condition of the patient.    Past Medical History  Diagnosis Date  . Hypertension   . Obesity   . High cholesterol   . Coronary artery disease   . GERD (gastroesophageal reflux disease)   . H/O carotid endarterectomy    Past Surgical History  Procedure Laterality Date  . Coronary angioplasty with stent placement    . Carotid endarterectomy    . Left heart catheterization with coronary angiogram N/A 06/08/2014    Procedure: LEFT HEART CATHETERIZATION WITH CORONARY ANGIOGRAM;  Surgeon: Blane Ohara, MD;  Location: [facility] CATH LAB;  Service: Cardiovascular;  Laterality: N/A;   Family History  Problem Relation Age of Onset  . Heart attack Father 62  . COPD Mother 37   History  Substance Use Topics  . Smoking status: Former Smoker    Quit date: 06/05/2012  . Smokeless tobacco: Not on file  . Alcohol Use: No    Review of Systems  Constitutional: Negative for fever, activity  change and appetite change.  HENT: Negative for congestion and rhinorrhea.   Respiratory: Positive for chest tightness and shortness of breath. Negative for cough.   Cardiovascular: Positive for chest pain.  Gastrointestinal: Negative for nausea, vomiting and abdominal pain.  Genitourinary: Negative for dysuria, hematuria and testicular pain.  Musculoskeletal: Negative for myalgias and arthralgias.  Skin: Negative for rash.  Neurological: Negative for dizziness, weakness and headaches.  A complete 10 system review of systems was obtained and all systems are negative except as noted in the HPI and PMH.      Allergies  Penicillins  Home Medications   Prior to Admission medications   Medication Sig Start Date End Date Taking? Authorizing Provider  aspirin EC 81 MG tablet Take 81 mg by mouth daily.   Yes Historical Provider, MD  atenolol (TENORMIN) 50 MG tablet Take 1 tablet (50 mg total) by mouth daily. 10/05/14  Yes Lorretta Harp, MD  Cholecalciferol (VITAMIN D3) 1000 UNITS CAPS Take 2,000 Units by mouth daily.    Yes Historical Provider, MD  clopidogrel (PLAVIX) 75 MG tablet Take 75 mg by mouth daily.   Yes Historical Provider, MD  co-enzyme Q-10 50 MG capsule Take 300 mg by mouth daily.   Yes Historical Provider, MD  isosorbide mononitrate (IMDUR) 30 MG 24 hr tablet Take 1 tablet (30 mg total) by mouth daily. 10/05/14  Yes Lorretta Harp, MD  loratadine (CLARITIN) 10 MG tablet Take 10 mg by mouth  daily as needed for allergies.   Yes Historical Provider, MD  losartan (COZAAR) 100 MG tablet Take 1 tablet (100 mg total) by mouth daily. 10/05/14  Yes Lorretta Harp, MD  Multiple Vitamins-Minerals (CENTRUM SILVER PO) Take 1 tablet by mouth daily.   Yes Historical Provider, MD  Omega-3 Fatty Acids (OMEGA-3 PO) Take 1,000 mg by mouth daily.    Yes Historical Provider, MD  omeprazole (PRILOSEC) 20 MG capsule Take 1 capsule (20 mg total) by mouth 2 (two) times daily before a meal. 10/05/14   Yes Lorretta Harp, MD  rosuvastatin (CRESTOR) 10 MG tablet Take 0.5 tablets (5 mg total) by mouth daily. Patient taking differently: Take 5 mg by mouth every other day.  08/18/14  Yes Lorretta Harp, MD   BP 132/57 mmHg  Pulse 80  Temp(Src) 98.1 F (36.7 C) (Oral)  Resp 13  Ht 5' 8.5" (1.74 m)  Wt 212 lb 11.9 oz (96.5 kg)  BMI 31.87 kg/m2  SpO2 100% Physical Exam  Constitutional: He is oriented to person, place, and time. He appears well-developed and well-nourished. No distress.  HENT:  Head: Normocephalic and atraumatic.  Mouth/Throat: Oropharynx is clear and moist. No oropharyngeal exudate.  Eyes: Conjunctivae and EOM are normal. Pupils are equal, round, and reactive to light.  Neck: Normal range of motion. Neck supple.  No meningismus.  Cardiovascular: Normal rate, regular rhythm, normal heart sounds and intact distal pulses.   No murmur heard. Pulmonary/Chest: Effort normal and breath sounds normal. No respiratory distress. He exhibits no tenderness.  Abdominal: Soft. There is no tenderness. There is no rebound and no guarding.  Musculoskeletal: Normal range of motion. He exhibits no edema or tenderness.  Neurological: He is alert and oriented to person, place, and time. No cranial nerve deficit. He exhibits normal muscle tone. Coordination normal.  No ataxia on finger to nose bilaterally. No pronator drift. 5/5 strength throughout. CN 2-12 intact. Negative Romberg. Equal grip strength. Sensation intact. Gait is normal.   Skin: Skin is warm.  Psychiatric: He has a normal mood and affect. His behavior is normal.  Nursing note and vitals reviewed.   ED Course  Procedures (including critical care time) Labs Review Labs Reviewed  CBC WITH DIFFERENTIAL/PLATELET - Abnormal; Notable for the following:    Eosinophils Relative 9 (*)    All other components within normal limits  BASIC METABOLIC PANEL - Abnormal; Notable for the following:    Glucose, Bld 107 (*)    GFR calc  non Af Amer 73 (*)    GFR calc Af Amer 84 (*)    All other components within normal limits  TROPONIN I  BRAIN NATRIURETIC PEPTIDE  TROPONIN I  PLATELET INHIBITION P2Y12  COMPREHENSIVE METABOLIC PANEL  TSH  TROPONIN I  TROPONIN I  TROPONIN I  PROTIME-INR  LIPID PANEL  HEMOGLOBIN A1C  HEPARIN LEVEL (UNFRACTIONATED)  CBC    Imaging Review Dg Chest 2 View  10/05/2014   CLINICAL DATA:  Chest pain  EXAM: CHEST  2 VIEW  COMPARISON:  08/11/2014  FINDINGS: Normal heart size and aortic contours. Coronary stents noted. There is no edema, consolidation, effusion, or pneumothorax. No osseous findings to explain chest pain. Surgical clips in the left neck correlating with carotid endarterectomy history.  IMPRESSION: No active cardiopulmonary disease.   Electronically Signed   By: Monte Fantasia M.D.   On: 10/05/2014 13:55     EKG Interpretation None      MDM   Final diagnoses:  Dyspnea on exertion   History of CAD with exertional chest pain over the past several days. Concern for anginal. EKG is unchanged. No chest pain currently.  Concern for unstable angina.  EKG unchanged, NSR, not uploading into note.  Troponin negative.  ASA given. D/w Cardiology who will admit for likely catheterization.     Ezequiel Essex, MD 10/05/14 [phone]

## 2014-10-05 NOTE — ED Notes (Signed)
Patient transported to X-ray 

## 2014-10-05 NOTE — Telephone Encounter (Signed)
°  1. Which medications need to be refilled? Omeprazole, Isosorbide, Losartan, and Atenolol  2. Which pharmacy is medication to be sent to?He is now using [facility]  3. Do they need a 30 day or 90 day supply? 90   4. Would they like a call back once the medication has been sent to the pharmacy? yes

## 2014-10-05 NOTE — Telephone Encounter (Signed)
Spoke with joanne, the patient reports off and on chest discomfort starting Saturday that is getting worse. He is also c/o SOB, joanne reports wheezing in lung fields. Mechele Claude reports bp 137/85 and normal EKG. The chest pain and SOB are not related to exertion. Mechele Claude feels he needs to be evaluated in the ER. Patient referred to ER.

## 2014-10-05 NOTE — H&P (Signed)
CARDIOLOGY HISTORY AND PHYSICAL   Patient ID: Reginald Little MRN: [id number] DOB/AGE: Aug 09, 1950 64 y.o.  Admit date: 10/05/2014  Primary Physician   Mathews Argyle, MD Primary Cardiologist  Dr. Gwenlyn Found Reason for Consultation Chest pain  XAJ:OINOMV [name] is a 64 y.o. year old male with a history of CAD s/p DES x 2 to the RCA (December 2013 at [facility]) and DES of OM and PLA branches (December 2015), prior tobacco use,  HTN, HLD, who presented to the ED with chest pain.  Stents December 2015 - intermittent chest pain since stent placement, occurs about every other week; started on Imdur 30 mg, which helped at first, now does not seem to provide relief. Sometimes occurs with exertion, which can be either physical or emotional, but sometimes occurs spontaneously. He has had shortness of breath with chest pain more often lately.   These episodes of chest pain started Saturday while mowing the lawn, he reports associated shortness of breath. Chest "discomfort/tightness" in center of chest at a 1-2/10 denies radiation. The pain has come and gone since then, each episode lasts for about 15-20 minutes; no change with position or deep inspiration, no relation to meals.   Reports nausea; denies palpitations, diaphoresis. States BP fluctuates. Feels similar to when he had episodes of chest pain before prior stent placement.  Not using NTG because it did not work for him and he did not get it refilled. He takes 4-5 baby ASA on the days he gets chest pain, and states this helps relieve the pain.  Denies recent illness. Denies cough, cold symptoms. Chest pain not worse with deep inspiration.   Currently, no chest pain or shortness of breath.   Past Medical History  Diagnosis Date  . Hypertension   . Obesity   . High cholesterol   . Coronary artery disease   . GERD (gastroesophageal reflux disease)   . H/O carotid endarterectomy      Past Surgical History  Procedure Laterality Date  .  Coronary angioplasty with stent placement    . Carotid endarterectomy    . Left heart catheterization with coronary angiogram N/A 06/08/2014    Procedure: LEFT HEART CATHETERIZATION WITH CORONARY ANGIOGRAM;  Surgeon: Blane Ohara, MD;  Location: [facility] CATH LAB;  Service: Cardiovascular;  Laterality: N/A;    Allergies  Allergen Reactions  . Penicillins Rash    I have reviewed the patient's current medications Medication Sig  aspirin EC 81 MG tablet Take 81 mg by mouth daily.  atenolol (TENORMIN) 50 MG tablet Take 1 tablet (50 mg total) by mouth daily.  Cholecalciferol (VITAMIN D3) 1000 UNITS CAPS Take 2,000 Units by mouth daily.   clopidogrel (PLAVIX) 75 MG tablet Take 75 mg by mouth daily.  co-enzyme Q-10 50 MG capsule Take 300 mg by mouth daily.  isosorbide mononitrate (IMDUR) 30 MG 24 hr tablet Take 1 tablet (30 mg total) by mouth daily.  loratadine (CLARITIN) 10 MG tablet Take 10 mg by mouth daily as needed for allergies.  losartan (COZAAR) 100 MG tablet Take 1 tablet (100 mg total) by mouth daily.  Multiple Vitamins-Minerals (CENTRUM SILVER PO) Take 1 tablet by mouth daily.  Omega-3 Fatty Acids (OMEGA-3 PO) Take 1,000 mg by mouth daily.   omeprazole (PRILOSEC) 20 MG capsule Take 1 capsule (20 mg total) by mouth 2 (two) times daily before a meal.  rosuvastatin (CRESTOR) 10 MG tablet Take 0.5 tablets (5 mg total) by mouth daily. Patient taking differently: Take 5 mg  by mouth every other day.      History   Social History  . Marital Status: Divorced    Spouse Name: N/A  . Number of Children: N/A  . Years of Education: N/A   Occupational History  . Owns transportation [place] History Main Topics  . Smoking status: Former Smoker    Quit date: 06/05/2012  . Smokeless tobacco: Not on file  . Alcohol Use: No  . Drug Use: No  . Sexual Activity: Not on file   Other Topics Concern  . Not on file   Social History Narrative   Lives alone.    Family  Status  Relation Status Death Age  . Father Deceased   . Mother Deceased   . Sister Alive    Family History  Problem Relation Age of Onset  . Heart attack Father 1  . COPD Mother 46     ROS:  Full 14 point review of systems complete and found to be negative unless listed above.  Physical Exam: Blood pressure 180/82, pulse 80, temperature 98.6 F (37 C), temperature source Oral, resp. rate 18, height 5' 8.5" (1.74 m), weight 212 lb 11.9 oz (96.5 kg), SpO2 100 %.  General: Well developed, well nourished, male in no acute distress. Head: Eyes PERRLA. Normocephalic and atraumatic, oropharynx without edema or exudate. Dentition: good.   Lungs: CTA bilaterally. No wheezing, rhonchi, or rales. Heart: HRRR S1 S2, no rub/gallop, no murmur. pulses are 2+ all 4 extrem.   Neck: No carotid bruits. No lymphadenopathy. JVD not elevated. Abdomen: Bowel sounds present, abdomen soft and non-tender without masses or hernias noted. Msk: No weakness, no joint deformities or effusions. Extremities: No clubbing or cyanosis. No edema.  Neuro: Alert and oriented X 3. No focal deficits noted. Psych:  Good affect, responds appropriately. Skin: No rashes or lesions noted.  Labs:   Lab Results  Component Value Date   WBC 5.8 10/05/2014   HGB 13.8 10/05/2014   HCT 40.6 10/05/2014   MCV 90.4 10/05/2014   PLT 196 10/05/2014    Recent Labs Lab 10/05/14 1302  NA 140  K 4.1  CL 102  CO2 29  BUN 11  CREATININE 1.05  CALCIUM 9.0  GLUCOSE 107*    Recent Labs  10/05/14 1302  TROPONINI <0.03    B NATRIURETIC PEPTIDE  Date/Time Value Ref Range Status  10/05/2014 01:03 PM 53.0 0.0 - 100.0 pg/mL Final  08/11/2014 08:43 PM 106.2* 0.0 - 100.0 pg/mL Final   Lab Results  Component Value Date   CHOL 122 08/18/2014   HDL 33* 08/18/2014   LDLCALC 68 08/18/2014   TRIG 103 08/18/2014    Echo: 06/06/2014 - Left ventricle: The cavity size was normal. Wall thickness was increased in a pattern  of mild LVH. Systolic function was normal. The estimated ejection fraction was in the range of 55% to 60%. Wall motion was normal; there were no regional wall motion abnormalities. Doppler parameters are consistent with abnormal left ventricular relaxation (grade 1 diastolic dysfunction). Doppler parameters are consistent with high ventricular filling pressure. - Aortic root: The aortic root was mildly dilated. - Mitral valve: Calcified annulus. Impressions: - Normal LV function; grade 1 diastolic dysfuntion; mild LVH; mildly dilated aortic root.  ECG: SR, no acute ischemic changes  Radiology:   Dg Chest 2 View 10/05/2014  CLINICAL DATA: Chest pain  EXAM: CHEST  2 VIEW  COMPARISON:  08/11/2014 FINDINGS: Normal heart size and aortic contours.  Coronary stents noted. There is no edema, consolidation, effusion, or pneumothorax. No osseous findings to explain chest pain. Surgical clips in the left neck correlating with carotid endarterectomy history.  IMPRESSION: No active cardiopulmonary disease.  Electronically Signed By: Monte Fantasia M.D. On: 10/05/2014 13:55    ASSESSMENT AND PLAN:   The patient was seen today by Dr Acie Fredrickson, the patient evaluated and the data reviewed.   Chest pain  Principal Problem:   Unstable angina pectoris - admit, r/o MI - add heparin - add IV NTG if ez positive - ck P2y12 - cath am, The risks and benefits of a cardiac catheterization including, but not limited to, death, stroke, MI, kidney damage and bleeding were discussed with the patient who indicates understanding and agrees to proceed.   Active Problems:   Essential hypertension - Follow on current rx    Hyperlipidemia - continue Crestor daily, no every other day - ck profile    GERD - pt feels sx controlled on Prilosec - change to Protonix for now - if goes on another anti-platelet agent, resume Protonix  Signed: [name], PA-C 10/05/2014 3:40 PM Beeper  [phone]  Co-Sign MD  Attending Note:   The patient was seen and examined.  Agree with assessment and plan as noted above.  Changes made to the above note as needed.  Pt's symptoms are concerning for unstable angina.   I think we should proceed with cardiac cath. We discussed risks / benefit / options.   He understands and agrees to proceed.     Thayer Headings, Brooke Bonito., MD, [facility] 10/06/2014, 9:10 AM [address],  [place] Pager [phone]

## 2014-10-05 NOTE — Progress Notes (Signed)
Pt arrived at [facility] today c/o chest discomfort off and on over past 2 days, associated with dyspnea.  Pt pain free upon arrival but c/o pain off and on this morning.  12 lead EKG performed, normal sinus rhythm.  Scattered wheeze in right upper and lower lobes. BP: 137/85. O2 sat-99%.  Dr. Kennon Holter office notified.  Recommendation to take pt to ED for evaluation.  Pt began c/o mild chest discomfort therefore pt transported via stretcher with monitor and O2-2L via nasal cannula. Report given to ED nurse.  Delorse Lek card master, notified of pt transfer to ED.

## 2014-10-05 NOTE — Telephone Encounter (Signed)
Spoke with pt, aware scripts sent into the pharmacy electronically.

## 2014-10-05 NOTE — ED Notes (Signed)
Pt from cardiac rehab and sts he has had chest discomfort and SOB two days ago. Pt sts he has not be able to complete activities due to SOB. Pt sts he was mowing the yard this weekend and was unable to finish because of SOB. Pt has 4 stents that have been placed and last two were put in this past December.

## 2014-10-05 NOTE — Progress Notes (Signed)
ANTICOAGULATION CONSULT NOTE - Initial Consult  Pharmacy Consult for Heparin Indication: chest pain/ACS  Allergies  Allergen Reactions  . Penicillins Rash    Patient Measurements: Height: 5' 8.5" (174 cm) Weight: 212 lb 11.9 oz (96.5 kg) IBW/kg (Calculated) : 69.55   Vital Signs: Temp: 98.1 F (36.7 C) (04/25 1747) Temp Source: Oral (04/25 1714) BP: 132/57 mmHg (04/25 1714) Pulse Rate: 80 (04/25 1530)  Labs:  Recent Labs  10/05/14 1302 10/05/14 1524  HGB 13.8  --   HCT 40.6  --   PLT 196  --   CREATININE 1.05  --   TROPONINI <0.03 <0.03    Estimated Creatinine Clearance: 79.8 mL/min (by C-G formula based on Cr of 1.05).   Medical History: Past Medical History  Diagnosis Date  . Hypertension   . Obesity   . High cholesterol   . Coronary artery disease   . GERD (gastroesophageal reflux disease)   . H/O carotid endarterectomy     Assessment: 64 year old male admitted with chest pain to begin heparin in preparation for cath 4/26  Goal of Therapy:  Heparin level 0.3-0.7 units/ml Monitor platelets by anticoagulation protocol: Yes   Plan:  Heparin 4500 units iv bolus x 1 Heparin drip at 1450 units / hr Heparin level 6 hours after heparin begins Daily heparin level / CBC  Thank you. Anette Guarneri, PharmD [phone]  Tad Moore 10/05/2014,5:53 PM

## 2014-10-06 ENCOUNTER — Encounter (HOSPITAL_COMMUNITY): Payer: Self-pay | Admitting: Physician Assistant

## 2014-10-06 ENCOUNTER — Encounter (HOSPITAL_COMMUNITY)
Admission: EM | Disposition: A | Discharge status: Home / Self Care | Payer: Self-pay | Source: Home / Self Care | Attending: Cardiovascular Disease

## 2014-10-06 DIAGNOSIS — Z87891 Personal history of nicotine dependence: Secondary | ICD-10-CM | POA: Diagnosis not present

## 2014-10-06 DIAGNOSIS — E785 Hyperlipidemia, unspecified: Secondary | ICD-10-CM | POA: Diagnosis not present

## 2014-10-06 DIAGNOSIS — R0609 Other forms of dyspnea: Secondary | ICD-10-CM

## 2014-10-06 DIAGNOSIS — I2510 Atherosclerotic heart disease of native coronary artery without angina pectoris: Secondary | ICD-10-CM | POA: Insufficient documentation

## 2014-10-06 DIAGNOSIS — Z6830 Body mass index (BMI) 30.0-30.9, adult: Secondary | ICD-10-CM | POA: Diagnosis not present

## 2014-10-06 DIAGNOSIS — Z9861 Coronary angioplasty status: Secondary | ICD-10-CM

## 2014-10-06 DIAGNOSIS — E669 Obesity, unspecified: Secondary | ICD-10-CM | POA: Diagnosis not present

## 2014-10-06 DIAGNOSIS — I10 Essential (primary) hypertension: Secondary | ICD-10-CM | POA: Diagnosis not present

## 2014-10-06 DIAGNOSIS — I25110 Atherosclerotic heart disease of native coronary artery with unstable angina pectoris: Secondary | ICD-10-CM | POA: Diagnosis not present

## 2014-10-06 DIAGNOSIS — I2089 Other forms of angina pectoris: Secondary | ICD-10-CM | POA: Insufficient documentation

## 2014-10-06 DIAGNOSIS — Z955 Presence of coronary angioplasty implant and graft: Secondary | ICD-10-CM | POA: Diagnosis not present

## 2014-10-06 DIAGNOSIS — Z79899 Other long term (current) drug therapy: Secondary | ICD-10-CM | POA: Diagnosis not present

## 2014-10-06 DIAGNOSIS — K219 Gastro-esophageal reflux disease without esophagitis: Secondary | ICD-10-CM | POA: Diagnosis not present

## 2014-10-06 DIAGNOSIS — I208 Other forms of angina pectoris: Secondary | ICD-10-CM | POA: Insufficient documentation

## 2014-10-06 HISTORY — PX: LEFT HEART CATHETERIZATION WITH CORONARY ANGIOGRAM: SHX5451

## 2014-10-06 LAB — CBC
HCT: 41.1 % (ref 39.0–52.0)
Hemoglobin: 13.7 g/dL (ref 13.0–17.0)
MCH: 30.2 pg (ref 26.0–34.0)
MCHC: 33.3 g/dL (ref 30.0–36.0)
MCV: 90.5 fL (ref 78.0–100.0)
PLATELETS: 185 10*3/uL (ref 150–400)
RBC: 4.54 MIL/uL (ref 4.22–5.81)
RDW: 12.5 % (ref 11.5–15.5)
WBC: 5.9 10*3/uL (ref 4.0–10.5)

## 2014-10-06 LAB — LIPID PANEL
CHOLESTEROL: 128 mg/dL (ref 0–200)
HDL: 29 mg/dL — ABNORMAL LOW (ref >39)
LDL CALC: 80 mg/dL (ref 0–99)
Total CHOL/HDL Ratio: 4.4 RATIO
Triglycerides: 96 mg/dL (ref <150)
VLDL: 19 mg/dL (ref 0–40)

## 2014-10-06 LAB — COMPREHENSIVE METABOLIC PANEL
ALK PHOS: 70 U/L (ref 39–117)
ALT: 35 U/L (ref 0–53)
AST: 23 U/L (ref 0–37)
Albumin: 3.3 g/dL — ABNORMAL LOW (ref 3.5–5.2)
Anion gap: 10 (ref 5–15)
BUN: 10 mg/dL (ref 6–23)
CHLORIDE: 104 mmol/L (ref 96–112)
CO2: 28 mmol/L (ref 19–32)
Calcium: 8.8 mg/dL (ref 8.4–10.5)
Creatinine, Ser: 0.94 mg/dL (ref 0.50–1.35)
GFR calc non Af Amer: 86 mL/min — ABNORMAL LOW (ref >90)
GLUCOSE: 124 mg/dL — AB (ref 70–99)
Potassium: 4.1 mmol/L (ref 3.5–5.1)
Sodium: 142 mmol/L (ref 135–145)
TOTAL PROTEIN: 6.3 g/dL (ref 6.0–8.3)
Total Bilirubin: 1.5 mg/dL — ABNORMAL HIGH (ref 0.3–1.2)

## 2014-10-06 LAB — TROPONIN I: Troponin I: <0.03 ng/mL (ref <0.031)

## 2014-10-06 LAB — HEMOGLOBIN A1C
Hgb A1c MFr Bld: 6.6 % — ABNORMAL HIGH (ref 4.8–5.6)
MEAN PLASMA GLUCOSE: 143 mg/dL

## 2014-10-06 LAB — HEPARIN LEVEL (UNFRACTIONATED)
HEPARIN UNFRACTIONATED: 0.67 [IU]/mL (ref 0.30–0.70)
Heparin Unfractionated: 0.69 IU/mL (ref 0.30–0.70)

## 2014-10-06 SURGERY — LEFT HEART CATHETERIZATION WITH CORONARY ANGIOGRAM
Anesthesia: LOCAL

## 2014-10-06 MED ORDER — SODIUM CHLORIDE 0.9 % IV SOLN: 250.0000 mL | INTRAVENOUS | Status: DC | PRN

## 2014-10-06 MED ORDER — SODIUM CHLORIDE 0.9 % IJ SOLN: 3.0000 mL | INTRAMUSCULAR | Status: DC | PRN

## 2014-10-06 MED ORDER — HEPARIN (PORCINE) IN NACL 2-0.9 UNIT/ML-% IJ SOLN
INTRAMUSCULAR | Status: AC
  Filled 2014-10-06: qty 1000

## 2014-10-06 MED ORDER — ROSUVASTATIN CALCIUM 20 MG PO TABS: 20.0000 mg | ORAL_TABLET | Freq: Every day | ORAL | Status: DC

## 2014-10-06 MED ORDER — ISOSORBIDE MONONITRATE ER 60 MG PO TB24: 60.0000 mg | ORAL_TABLET | Freq: Every day | ORAL | Status: DC

## 2014-10-06 MED ORDER — LIDOCAINE HCL (PF) 1 % IJ SOLN
INTRAMUSCULAR | Status: AC
  Filled 2014-10-06: qty 30

## 2014-10-06 MED ORDER — MIDAZOLAM HCL 2 MG/2ML IJ SOLN
INTRAMUSCULAR | Status: AC
  Filled 2014-10-06: qty 2

## 2014-10-06 MED ORDER — FENTANYL CITRATE (PF) 100 MCG/2ML IJ SOLN
INTRAMUSCULAR | Status: AC
  Filled 2014-10-06: qty 2

## 2014-10-06 MED ORDER — SODIUM CHLORIDE 0.9 % IV SOLN
INTRAVENOUS | Status: AC
  Administered 2014-10-06: 17:00:00 via INTRAVENOUS

## 2014-10-06 MED ORDER — ROSUVASTATIN CALCIUM 20 MG PO TABS
20.0000 mg | ORAL_TABLET | Freq: Every day | ORAL | Status: DC
  Administered 2014-10-06: 20 mg via ORAL
  Filled 2014-10-06 (×2): qty 1

## 2014-10-06 MED ORDER — NITROGLYCERIN 0.4 MG SL SUBL: 0.4000 mg | SUBLINGUAL_TABLET | SUBLINGUAL | Status: DC | PRN

## 2014-10-06 MED ORDER — SODIUM CHLORIDE 0.9 % IJ SOLN: 3.0000 mL | Freq: Two times a day (BID) | INTRAMUSCULAR | Status: DC

## 2014-10-06 MED ORDER — HEPARIN SODIUM (PORCINE) 1000 UNIT/ML IJ SOLN
INTRAMUSCULAR | Status: AC
  Filled 2014-10-06: qty 1

## 2014-10-06 MED ORDER — VERAPAMIL HCL 2.5 MG/ML IV SOLN
INTRAVENOUS | Status: AC
  Filled 2014-10-06: qty 2

## 2014-10-06 MED ORDER — NITROGLYCERIN 1 MG/10 ML FOR IR/CATH LAB
INTRA_ARTERIAL | Status: AC
Start: 2014-10-06 — End: 2014-10-06
  Filled 2014-10-06: qty 10

## 2014-10-06 MED ORDER — PANTOPRAZOLE SODIUM 40 MG PO TBEC: 40.0000 mg | DELAYED_RELEASE_TABLET | Freq: Every day | ORAL | Status: DC

## 2014-10-06 NOTE — CV Procedure (Signed)
CARDIAC CATHETERIZATION REPORT  NAME:  Reginald Little   MRN: [id number] DOB:  1950/12/30   ADMIT DATE: 10/05/2014 Procedure Date: 10/06/2014  INTERVENTIONAL CARDIOLOGIST: Leonie Man, M.D., MS PRIMARY CARE PROVIDER: Mathews Argyle, MD PRIMARY CARDIOLOGIST:  Lorretta Harp, MD  PATIENT:  Reginald Little is a 64 y.o. male with history of extensive PCI to the RCA (DES 2) at [facility] followed by recent PCI to the RPL system and OM 2 by Dr. Burt Knack back in December 2015. He presented to [facility] April 25 with signs and symptoms of unstable angina. He is now referred for invasive evaluation based on his recent PCI.  PRE-OPERATIVE DIAGNOSIS:    Unstable Angina  Known CAD Status Post Multivessel PCI  PROCEDURES PERFORMED:    Left Heart Catheterization with Native Coronary Angiography  via Right Radial Artery   Left Ventriculography  Direct or Sound Guided Radial Access   PROCEDURE: The patient was brought to the [facility] in the fasting state and prepped and draped in the usual sterile fashion for Right Radial artery access. A modified Allen's test was performed on the right wrist demonstrating excellent collateral flow for radial access.   Sterile technique was used including antiseptics, cap, gloves, gown, hand hygiene, mask and sheet. Skin prep: Chlorhexidine.   Consent: Risks of procedure as well as the alternatives and risks of each were explained to the (patient/caregiver). Consent for procedure obtained.   Time Out: Verified patient identification, verified procedure, site/side was marked, verified correct patient position, special equipment/implants available, medications/allergies/relevent history reviewed, required imaging and test results available. Performed.  Access:   Right Radial Artery: 6 Fr Sheath -  Seldinger Technique (Angiocath Micropuncture Kit)  Radial Cocktail - 10 mL; IV Heparin 5000 Units   Left  Heart Catheterization: 5 Fr Catheters advanced or exchanged over a long exchange safety J wire under direct fluoroscopic guidance into the ascending aorta ;  TIG 4.0  catheter advanced first.  Right Coronary  Artery Cineangiography:  TIG 4.0 catheter Left  Coronary Artery Cineangiography: JL 3.5  catheter  LV Hemodynamics (LV Gram): Angled pigtail catheter  Radial sheath removed in the cardiac catheterization lab with TR band placement for hemostasis.  TR Band: 1610 hours; 12 mL air  FINDINGS:  Hemodynamics:   Central Aortic Pressure / Mean: 110/62/79 mmHg  Left Ventricular Pressure / LVEDP:   116/9/19 mmHg  Left Ventriculography:  EF: 65  %  Wall Motion: Normal   Coronary Anatomy:  Dominance: Right   Left Main: Large caliber, long mainstem with a actual septal perforator before trifurcating into the LAD, Ramus Intermedius and Circumflex. Angiographically normal.  LAD: Normal caliber vessel that is patent to the apex. There are mild diffuse irregularities with a roughly 40% focal lesion in the mid vessel. No high-grade lesions noted. There are 3 diagonal branches that are at least moderate caliber with minimal luminal irregularities.  Left Circumflex: Normal caliber, nondominant vessel with 2 obtuse marginal branches. First OM is patent and a second OM are widely patent proximal stent. The remainder of both OM vessels are free of disease. The small AV groove branch and terminates as a small posterolateral artery with no significant disease.   OM1: small-moderate caliber vessel; angiographic normal.   OM 2: small-moderate-caliber vessel with widely patent proximal stent.  . Ramus intermedius: moderate large-caliber vessel that bifurcates in the mid vessel into 2 small moderate caliber branches. Mild luminal irregularities    RCA: Large-caliber,  dominant vessel with a high, anterior origin. The vessel is extremely tortuous with at least 3 major bends. It is heavily stented  throughout the entire mid segment with mild in-stent stenosis of roughly 30-40%. The vessel bifurcates distally into the RPDA and the  Right Posterior AV Groove Branch (RPAV)  RPDA: Moderate caliber vessel. Angiographically normal.  RPL Sysytem:The RPAV begins as a small moderate caliber vessel it bifurcates into 2 posterolateral branches. There is a widely patent stent from the RPAV into RPL 1. No problems with flow in the jailed RPL 2.   MEDICATIONS:  Anesthesia:  Local Lidocaine 2 ml  Sedation:  2 mg IV Versed, 50 mcg IV fentanyl ;  Omnipaque Contrast: 65 ml  Anticoagulation:  IV Heparin 5000 Units  Anti-Platelet Agent:  Pt is on Plavix Radial Cocktail: 5 mg Verapamil, 400 mcg NTG, 2 ml 2% Lidocaine in 10 ml NS   PATIENT DISPOSITION:    The patient was transferred to the PACU holding area in a hemodynamicaly stable, chest pain free condition.  The patient tolerated the procedure well, and there were no complications.  EBL:   < 10 ml  The patient was stable before, during, and after the procedure.  POST-OPERATIVE DIAGNOSIS:    2+ vessel disease with patent stents throughout the entire mid RCA and posterolateral branch of the RCA as well as OM 2.  Preserved LVEF with moderately elevated LVEDP despite having normal systolic pressures.  Mild (5-10 mm) gradient across aortic valve  No angiographically significant lesion to explain the patient's presentation. Consider possible microvascular ischemia with diastolic dysfunction.  PLAN OF CARE:  Standard post radial cath care. Anticipate discharge later this afternoon after bed rest.   Continue aggressive risk factor modification - Titrate anti-anginal medications ; will increase Imdur to 60 mg for discharge.    he will need follow-up with Dr. Leandro Reasoner, DAVID Viona Gilmore, M.D., M.S. Interventional Cardiologist   Pager # [phone]

## 2014-10-06 NOTE — Discharge Summary (Signed)
Discharge Summary   Patient ID: Reginald Little MRN: [id number], DOB/AGE: 1951/03/16 64 y.o. Admit date: 10/05/2014 D/C date:     10/06/2014  Primary Cardiologist: Dr. Gwenlyn Found   Principal Problem:   Chest pain Active Problems:   Essential hypertension   Obesity (BMI 30.0-34.9)   Hyperlipidemia   Carotid artery disease   Coronary artery disease    Admission Dates: 10/05/14-10/06/14 Discharge Diagnosis: Chest pain s/p LHC with patent stents and non-obst CAD  HPI: Reginald Little is a 64 y.o. male with a history of CAD s/p DES x 2 to the RCA (05/2012 at [facility]) and DES of OM and PLA branches (05/2014), prior tobacco use,carotid artery disease, HTN and HLD who presented to the [facility] ED on 10/05/14 with chest pain concerning for [place].  He had DES to OM and PLA branches in 05/2014 and reports intermittent chest pain since that time which occurs about every other week. He was started on Imdur 30 mg, which helped at first, now does not seem to provide relief. Sometimes occurs with exertion, which can be either physical or emotional, but sometimes occurs spontaneously. He has had shortness of breath with chest pain more often lately.  These episodes of chest pain started Saturday while mowing the lawn. He reported associated shortness of breath and nausea. The pain was intermittant and felt similar to chest pain prior to previous stent placement. Not using NTG because it did not work for him and he did not get it refilled. He takes 4-5 baby ASA on the days he gets chest pain, and states this helps relieve the pain.   Hospital Course  Chest pain- concerning for [place] -- Troponin neg x3.  ECG non-acute. -- He underwent LHC on 10/06/14 which revealed  2+ vessel disease with patent stents throughout the entire mid RCA and posterolateral branch of the RCA as well as OM 2.  Preserved LVEF with moderately elevated LVEDP despite having normal systolic pressures.  Mild (5-10 mm) gradient across aortic valve  No  angiographically significant lesion to explain the patient's presentation. Consider possible microvascular ischemia with diastolic dysfunction. -- Cont asa, statin, plavix, bb. Imdur increased to 60mg  at discharge.   Essential HTN: Stable. Cont bb/arb.  HDL: LDL 80. Nl LFT's. Crestor titrated up to 20mg .  GERD: patient on prilosec which has a theoretical interaction with plavix. Will change him to protonix.   The patient has had an uncomplicated hospital course and is recovering well. The radial catheter site is stable. He has been seen by Dr. Ellyn Hack today and deemed ready for discharge home. All follow-up appointments have been scheduled. Discharge medications are listed below.   Discharge Vitals: Blood pressure 158/83, pulse 75, temperature 98.1 F (36.7 C), temperature source Oral, resp. rate 18, height 5' 8.5" (1.74 m), weight 206 lb 12.7 oz (93.8 kg), SpO2 97 %.  Labs: Lab Results  Component Value Date   WBC 5.9 10/06/2014   HGB 13.7 10/06/2014   HCT 41.1 10/06/2014   MCV 90.5 10/06/2014   PLT 185 10/06/2014     Recent Labs Lab 10/06/14 0505  NA 142  K 4.1  CL 104  CO2 28  BUN 10  CREATININE 0.94  CALCIUM 8.8  PROT 6.3  BILITOT 1.5*  ALKPHOS 70  ALT 35  AST 23  GLUCOSE 124*    Recent Labs  10/05/14 1524 10/05/14 1854 10/06/14 0010 10/06/14 0505  TROPONINI <0.03 <0.03 <0.03 <0.03   Lab Results  Component Value Date  CHOL 128 10/06/2014   HDL 29* 10/06/2014   LDLCALC 80 10/06/2014   TRIG 96 10/06/2014     Diagnostic Studies/Procedures   Dg Chest 2 View  10/05/2014   CLINICAL DATA:  Chest pain  EXAM: CHEST  2 VIEW  COMPARISON:  08/11/2014  FINDINGS: Normal heart size and aortic contours. Coronary stents noted. There is no edema, consolidation, effusion, or pneumothorax. No osseous findings to explain chest pain. Surgical clips in the left neck correlating with carotid endarterectomy history.  IMPRESSION: No active cardiopulmonary disease.    Electronically Signed   By: Monte Fantasia M.D.   On: 10/05/2014 13:55     10/06/14 CARDIAC CATHETERIZATION REPORT FINDINGS:  Hemodynamics:   Central Aortic Pressure / Mean: 110/62/79 mmHg  Left Ventricular Pressure / LVEDP: 116/9/19 mmHg  Left Ventriculography:  EF: 65 %  Wall Motion: Normal  POST-OPERATIVE DIAGNOSIS:   2+ vessel disease with patent stents throughout the entire mid RCA and posterolateral branch of the RCA as well as OM 2.  Preserved LVEF with moderately elevated LVEDP despite having normal systolic pressures.  Mild (5-10 mm) gradient across aortic valve  No angiographically significant lesion to explain the patient's presentation. Consider possible microvascular ischemia with diastolic dysfunction. PLAN OF CARE:  Standard post radial cath care. Anticipate discharge later this afternoon after bed rest.   Continue aggressive risk factor modification - Titrate anti-anginal medications ; will increase Imdur to 60 mg for discharge.    Discharge Medications     Medication List    STOP taking these medications        omeprazole 20 MG capsule  Commonly known as:  PRILOSEC  Replaced by:  pantoprazole 40 MG tablet      TAKE these medications        aspirin EC 81 MG tablet  Take 81 mg by mouth daily.     atenolol 50 MG tablet  Commonly known as:  TENORMIN  Take 1 tablet (50 mg total) by mouth daily.     CENTRUM SILVER PO  Take 1 tablet by mouth daily.     clopidogrel 75 MG tablet  Commonly known as:  PLAVIX  Take 75 mg by mouth daily.     co-enzyme Q-10 50 MG capsule  Take 300 mg by mouth daily.     isosorbide mononitrate 60 MG 24 hr tablet  Commonly known as:  IMDUR  Take 1 tablet (60 mg total) by mouth daily.     loratadine 10 MG tablet  Commonly known as:  CLARITIN  Take 10 mg by mouth daily as needed for allergies.     losartan 100 MG tablet  Commonly known as:  COZAAR  Take 1 tablet (100 mg total) by mouth daily.      nitroGLYCERIN 0.4 MG SL tablet  Commonly known as:  NITROSTAT  Place 1 tablet (0.4 mg total) under the tongue every 5 (five) minutes x 3 doses as needed for chest pain.     OMEGA-3 PO  Take 1,000 mg by mouth daily.     pantoprazole 40 MG tablet  Commonly known as:  PROTONIX  Take 1 tablet (40 mg total) by mouth daily at 6 (six) AM.     rosuvastatin 20 MG tablet  Commonly known as:  CRESTOR  Take 1 tablet (20 mg total) by mouth daily.     Vitamin D3 1000 UNITS Caps  Take 2,000 Units by mouth daily.        Disposition   The  patient will be discharged in stable condition to home.  Follow-up Information    Follow up with HAGER, BRYAN, PA-C On 10/30/2014.   Specialty:  Physician Assistant   Why:  9:30am   Contact information:   [address] [place] [id number] [phone]         Duration of Discharge Encounter: Greater than 30 minutes including physician and PA time.  Mable Fill R PA-C 10/06/2014, 4:55 PM

## 2014-10-06 NOTE — Progress Notes (Signed)
ANTICOAGULATION CONSULT NOTE - Follow Up Consult  Pharmacy Consult for heparin Indication: chest pain/ACS   Labs:  Recent Labs  10/05/14 1302 10/05/14 1524 10/05/14 1854 10/06/14 0010 10/06/14 0505  HGB 13.8  --   --   --  13.7  HCT 40.6  --   --   --  41.1  PLT 196  --   --   --  185  LABPROT  --   --  13.7  --   --   INR  --   --  1.04  --   --   HEPARINUNFRC  --   --   --   --  0.67  CREATININE 1.05  --   --   --   --   TROPONINI <0.03 <0.03 <0.03 <0.03  --      Assessment/Plan:  64yo male therapeutic on heparin with initial dosing for CP. Will continue gtt at current rate and confirm stable with additional level.   Wynona Neat, PharmD, BCPS  10/06/2014,6:04 AM

## 2014-10-06 NOTE — Interval H&P Note (Signed)
History and Physical Interval Note:  10/06/2014 3:03 PM  Reginald Little  has presented today for surgery, with the diagnosis of Unstable Angina. The various methods of treatment have been discussed with the patient and family. After consideration of risks, benefits and other options for treatment, the patient has consented to  Procedure(s): LEFT HEART CATHETERIZATION WITH CORONARY ANGIOGRAM (N/A) +/- PCI as a surgical intervention .  The patient's history has been reviewed, patient examined, no change in status, stable for surgery.  I have reviewed the patient's chart and labs.  Questions were answered to the patient's satisfaction.    Cath Lab Visit (complete for each Cath Lab visit)  Clinical Evaluation Leading to the Procedure:   ACS: Yes.    Non-ACS:    Anginal Classification: CCS IV  Anti-ischemic medical therapy: Maximal Therapy (2 or more classes of medications)  Non-Invasive Test Results: No non-invasive testing performed  Prior CABG: No previous CABG   TIMI SCORE  Patient Information:  TIMI Score is 4  UA/NSTEMI and intermediate-risk features (e.g., TIMI score 3?4) for short-term risk of death or nonfatal MI  Revascularization of the presumed culprit artery   A (8)  Indication: 10; Score: 8   Reginald Little

## 2014-10-06 NOTE — Progress Notes (Signed)
Pt discharged to home. Vital signs stable as charted, Pt had no complaints, no distress, telemetry removed, IV removed, pt left floor in wheelchair escorted by nurse tech.  Pt left hospital in private car.

## 2014-10-06 NOTE — Discharge Instructions (Signed)

## 2014-10-06 NOTE — Progress Notes (Signed)
UR COMPLETED  

## 2014-10-06 NOTE — Progress Notes (Signed)
[place] for Heparin Indication: chest pain/ACS  Allergies  Allergen Reactions  . Penicillins Rash    Patient Measurements: Height: 5' 8.5" (174 cm) Weight: 206 lb 12.7 oz (93.8 kg) IBW/kg (Calculated) : 69.55   Vital Signs: Temp: 98.9 F (37.2 C) (04/26 0729) Temp Source: Oral (04/26 0729) BP: 157/68 mmHg (04/26 0729) Pulse Rate: 81 (04/26 0729)  Labs:  Recent Labs  10/05/14 1302  10/05/14 1854 10/06/14 0010 10/06/14 0505 10/06/14 1148  HGB 13.8  --   --   --  13.7  --   HCT 40.6  --   --   --  41.1  --   PLT 196  --   --   --  185  --   LABPROT  --   --  13.7  --   --   --   INR  --   --  1.04  --   --   --   HEPARINUNFRC  --   --   --   --  0.67 0.69  CREATININE 1.05  --   --   --  0.94  --   TROPONINI <0.03  < > <0.03 <0.03 <0.03  --   < > = values in this interval not displayed.  Estimated Creatinine Clearance: 87.9 mL/min (by C-G formula based on Cr of 0.94).   Assessment: 64 year old male admitted with chest pain on heparin for ACS in preparation for cath today.  First HL 0.67 and confirmatory HL 0.69 on rate of 1450 units/hr. CBC stable, no bleeding reported. On cath lab schedule for today  Goal of Therapy:  Heparin level 0.3-0.7 units/ml Monitor platelets by anticoagulation protocol: Yes  Plan: continue heparin at 1450 units/hr Daily HL and CBC F/u after cath  Eudelia Bunch, Pharm.D. [phone] 10/06/2014 1:00 PM

## 2014-10-06 NOTE — H&P (View-Only) (Signed)
Patient Name: Reginald Little Date of Encounter: 10/06/2014   Principal Problem:   Unstable angina pectoris Active Problems:   CAD (coronary artery disease)   Essential hypertension   Obesity (BMI 30.0-34.9)   Hyperlipidemia  SUBJECTIVE  Intermittent left chest tightness/indigestion overnight.  CE neg.  For cath today.  CURRENT MEDS . aspirin EC  81 mg Oral Daily  . atenolol  50 mg Oral Daily  . cholecalciferol  2,000 Units Oral Daily  . clopidogrel  75 mg Oral Daily  . isosorbide mononitrate  30 mg Oral Daily  . losartan  100 mg Oral Daily  . omega-3 acid ethyl esters  1 g Oral Daily  . pantoprazole  40 mg Oral Q0600  . pneumococcal 23 valent vaccine  0.5 mL Intramuscular Tomorrow-1000  . rosuvastatin  5 mg Oral q1800  . sodium chloride  3 mL Intravenous Q12H  . sodium chloride  3 mL Intravenous Q12H   OBJECTIVE  Filed Vitals:   10/05/14 1941 10/06/14 0023 10/06/14 0540 10/06/14 0729  BP: 109/79 127/57 102/83 157/68  Pulse: 65 65 76 81  Temp: 98.1 F (36.7 C) 97.9 F (36.6 C) 97.8 F (36.6 C) 98.9 F (37.2 C)  TempSrc: Oral Oral Oral Oral  Resp: 15 16 16 18   Height:      Weight:  206 lb 12.7 oz (93.8 kg)    SpO2: 96% 94% 97% 97%    Intake/Output Summary (Last 24 hours) at 10/06/14 1000 Last data filed at 10/06/14 0700  Gross per 24 hour  Intake    120 ml  Output    800 ml  Net   -680 ml   Filed Weights   10/05/14 1157 10/06/14 0023  Weight: 212 lb 11.9 oz (96.5 kg) 206 lb 12.7 oz (93.8 kg)    PHYSICAL EXAM  General: Pleasant, NAD. Neuro: Alert and oriented X 3. Moves all extremities spontaneously. Psych: Normal affect. HEENT:  Normal  Neck: Supple without bruits or JVD. Lungs:  Resp regular and unlabored, CTA. Heart: RRR no s3, s4, or murmurs. Abdomen: Soft, non-tender, non-distended, BS + x 4.  Extremities: No clubbing, cyanosis or edema. DP/PT/Radials 2+ and equal bilaterally.  Accessory Clinical Findings  CBC  Recent Labs  10/05/14 1302  10/06/14 0505  WBC 5.8 5.9  NEUTROABS 3.0  --   HGB 13.8 13.7  HCT 40.6 41.1  MCV 90.4 90.5  PLT 196 124   Basic Metabolic Panel  Recent Labs  10/05/14 1302 10/06/14 0505  NA 140 142  K 4.1 4.1  CL 102 104  CO2 29 28  GLUCOSE 107* 124*  BUN 11 10  CREATININE 1.05 0.94  CALCIUM 9.0 8.8   Liver Function Tests  Recent Labs  10/06/14 0505  AST 23  ALT 35  ALKPHOS 70  BILITOT 1.5*  PROT 6.3  ALBUMIN 3.3*   Cardiac Enzymes  Recent Labs  10/05/14 1854 10/06/14 0010 10/06/14 0505  TROPONINI <0.03 <0.03 <0.03   Hemoglobin A1C  Recent Labs  10/05/14 1854  HGBA1C 6.6*   Fasting Lipid Panel  Recent Labs  10/06/14 0505  CHOL 128  HDL 29*  LDLCALC 80  TRIG 96  CHOLHDL 4.4   Thyroid Function Tests  Recent Labs  10/05/14 1854  TSH 1.219    TELE  rsr  ECG  Rsr, 71, no acute st/t changes.  Radiology/Studies  Dg Chest 2 View  10/05/2014   CLINICAL DATA:  Chest pain  EXAM: CHEST  2 VIEW  COMPARISON:  08/11/2014  FINDINGS: Normal heart size and aortic contours. Coronary stents noted. There is no edema, consolidation, effusion, or pneumothorax. No osseous findings to explain chest pain. Surgical clips in the left neck correlating with carotid endarterectomy history.  IMPRESSION: No active cardiopulmonary disease.   Electronically Signed   By: Monte Fantasia M.D.   On: 10/05/2014 13:55    ASSESSMENT AND PLAN  1.  USA/CAD:  Pt admitted with progressive exertional c/p.  CE neg.  ECG non-acute.  NPO for cath today.  Cont asa, statin, plavix, bb.  2.  Essential HTN:  Stable yesterday.  Higher this AM. Cont bb/arb.  3.  HL:  LDL 80.  Nl LFT's.  Titrate crestor.  Signed, Murray Hodgkins NP  Patient seen, examined. Available data reviewed. Agree with findings, assessment, and plan as outlined by Ignacia Bayley, NP. The patient is independently interviewed and examined. Exam reveals an alert, oriented male in no distress. His lung fields are clear.  Heart is regular rate and rhythm without murmur or gallop. There is no peripheral edema. Serial cardiac markers are negative. The patient is chest pain-free this morning on aspirin, Plavix, and IV heparin. For cardiac catheterization later today by Dr. Ellyn Hack. I have reviewed the risks, indications, and alternatives to cardiac catheterization and possible PCI. The patient has been through the procedure twice in the past and has no questions. Further disposition pending his cardiac cath results.  [name], M.D. 10/06/2014 10:49 AM

## 2014-10-06 NOTE — Care Management Note (Unsigned)
    Page 1 of 1   10/06/2014     2:59:48 PM CARE MANAGEMENT NOTE 10/06/2014  Patient:  Reginald Little,Reginald Little   Account Number:  [id number]  Date Initiated:  10/06/2014  Documentation initiated by:  Whitman Hero  Subjective/Objective Assessment:   PTA from home admitted with unstable angina.     Action/Plan:   Return to home wihen medically stable. CM to f/u with d/c disposition.   Anticipated DC Date:     Anticipated DC Plan:  [place]  CM consult      Choice offered to / List presented to:             Status of service:  In process, will continue to follow Medicare Important Message given?   (If response is "NO", the following Medicare IM given date fields will be blank) Date Medicare IM given:   Medicare IM given by:   Date Additional Medicare IM given:   Additional Medicare IM given by:    Discharge Disposition:    Per UR Regulation:  Reviewed for med. necessity/level of care/duration of stay  If discussed at [place] of Stay Meetings, dates discussed:    Comments:

## 2014-10-06 NOTE — Progress Notes (Signed)
TR BAND REMOVAL  LOCATION:  right radial  DEFLATED PER PROTOCOL:  Yes.    TIME BAND OFF / DRESSING APPLIED:   1900    SITE UPON ARRIVAL:   Level 0  SITE AFTER BAND REMOVAL:  Level 0  REVERSE ALLEN'S TEST:    positive  CIRCULATION SENSATION AND MOVEMENT:  Within Normal Limits  Yes.    COMMENTS:

## 2014-10-06 NOTE — Progress Notes (Signed)
Patient Name: Reginald Little Date of Encounter: 10/06/2014   Principal Problem:   Unstable angina pectoris Active Problems:   CAD (coronary artery disease)   Essential hypertension   Obesity (BMI 30.0-34.9)   Hyperlipidemia  SUBJECTIVE  Intermittent left chest tightness/indigestion overnight.  CE neg.  For cath today.  CURRENT MEDS . aspirin EC  81 mg Oral Daily  . atenolol  50 mg Oral Daily  . cholecalciferol  2,000 Units Oral Daily  . clopidogrel  75 mg Oral Daily  . isosorbide mononitrate  30 mg Oral Daily  . losartan  100 mg Oral Daily  . omega-3 acid ethyl esters  1 g Oral Daily  . pantoprazole  40 mg Oral Q0600  . pneumococcal 23 valent vaccine  0.5 mL Intramuscular Tomorrow-1000  . rosuvastatin  5 mg Oral q1800  . sodium chloride  3 mL Intravenous Q12H  . sodium chloride  3 mL Intravenous Q12H   OBJECTIVE  Filed Vitals:   10/05/14 1941 10/06/14 0023 10/06/14 0540 10/06/14 0729  BP: 109/79 127/57 102/83 157/68  Pulse: 65 65 76 81  Temp: 98.1 F (36.7 C) 97.9 F (36.6 C) 97.8 F (36.6 C) 98.9 F (37.2 C)  TempSrc: Oral Oral Oral Oral  Resp: 15 16 16 18   Height:      Weight:  206 lb 12.7 oz (93.8 kg)    SpO2: 96% 94% 97% 97%    Intake/Output Summary (Last 24 hours) at 10/06/14 1000 Last data filed at 10/06/14 0700  Gross per 24 hour  Intake    120 ml  Output    800 ml  Net   -680 ml   Filed Weights   10/05/14 1157 10/06/14 0023  Weight: 212 lb 11.9 oz (96.5 kg) 206 lb 12.7 oz (93.8 kg)    PHYSICAL EXAM  General: Pleasant, NAD. Neuro: Alert and oriented X 3. Moves all extremities spontaneously. Psych: Normal affect. HEENT:  Normal  Neck: Supple without bruits or JVD. Lungs:  Resp regular and unlabored, CTA. Heart: RRR no s3, s4, or murmurs. Abdomen: Soft, non-tender, non-distended, BS + x 4.  Extremities: No clubbing, cyanosis or edema. DP/PT/Radials 2+ and equal bilaterally.  Accessory Clinical Findings  CBC  Recent Labs  10/05/14 1302  10/06/14 0505  WBC 5.8 5.9  NEUTROABS 3.0  --   HGB 13.8 13.7  HCT 40.6 41.1  MCV 90.4 90.5  PLT 196 660   Basic Metabolic Panel  Recent Labs  10/05/14 1302 10/06/14 0505  NA 140 142  K 4.1 4.1  CL 102 104  CO2 29 28  GLUCOSE 107* 124*  BUN 11 10  CREATININE 1.05 0.94  CALCIUM 9.0 8.8   Liver Function Tests  Recent Labs  10/06/14 0505  AST 23  ALT 35  ALKPHOS 70  BILITOT 1.5*  PROT 6.3  ALBUMIN 3.3*   Cardiac Enzymes  Recent Labs  10/05/14 1854 10/06/14 0010 10/06/14 0505  TROPONINI <0.03 <0.03 <0.03   Hemoglobin A1C  Recent Labs  10/05/14 1854  HGBA1C 6.6*   Fasting Lipid Panel  Recent Labs  10/06/14 0505  CHOL 128  HDL 29*  LDLCALC 80  TRIG 96  CHOLHDL 4.4   Thyroid Function Tests  Recent Labs  10/05/14 1854  TSH 1.219    TELE  rsr  ECG  Rsr, 71, no acute st/t changes.  Radiology/Studies  Dg Chest 2 View  10/05/2014   CLINICAL DATA:  Chest pain  EXAM: CHEST  2 VIEW  COMPARISON:  08/11/2014  FINDINGS: Normal heart size and aortic contours. Coronary stents noted. There is no edema, consolidation, effusion, or pneumothorax. No osseous findings to explain chest pain. Surgical clips in the left neck correlating with carotid endarterectomy history.  IMPRESSION: No active cardiopulmonary disease.   Electronically Signed   By: Monte Fantasia M.D.   On: 10/05/2014 13:55    ASSESSMENT AND PLAN  1.  USA/CAD:  Pt admitted with progressive exertional c/p.  CE neg.  ECG non-acute.  NPO for cath today.  Cont asa, statin, plavix, bb.  2.  Essential HTN:  Stable yesterday.  Higher this AM. Cont bb/arb.  3.  HL:  LDL 80.  Nl LFT's.  Titrate crestor.  Signed, Murray Hodgkins NP  Patient seen, examined. Available data reviewed. Agree with findings, assessment, and plan as outlined by Ignacia Bayley, NP. The patient is independently interviewed and examined. Exam reveals an alert, oriented male in no distress. His lung fields are clear.  Heart is regular rate and rhythm without murmur or gallop. There is no peripheral edema. Serial cardiac markers are negative. The patient is chest pain-free this morning on aspirin, Plavix, and IV heparin. For cardiac catheterization later today by Dr. Ellyn Hack. I have reviewed the risks, indications, and alternatives to cardiac catheterization and possible PCI. The patient has been through the procedure twice in the past and has no questions. Further disposition pending his cardiac cath results.  [name], M.D. 10/06/2014 10:49 AM

## 2014-10-07 ENCOUNTER — Encounter (HOSPITAL_COMMUNITY)
Admission: RE | Admit: 2014-10-07 | Discharge: 2014-10-07 | Disposition: A | Discharge status: Home / Self Care | Payer: Commercial Managed Care - HMO | Source: Ambulatory Visit | Attending: Cardiovascular Disease | Admitting: Cardiovascular Disease

## 2014-10-07 ENCOUNTER — Encounter (HOSPITAL_COMMUNITY): Payer: Commercial Managed Care - HMO

## 2014-10-09 ENCOUNTER — Encounter (HOSPITAL_COMMUNITY): Payer: Commercial Managed Care - HMO

## 2014-10-09 ENCOUNTER — Encounter (HOSPITAL_COMMUNITY): Admission: RE | Admit: 2014-10-09 | Payer: Commercial Managed Care - HMO | Source: Ambulatory Visit

## 2014-10-09 DIAGNOSIS — M7072 Other bursitis of hip, left hip: Secondary | ICD-10-CM | POA: Diagnosis not present

## 2014-10-09 DIAGNOSIS — I10 Essential (primary) hypertension: Secondary | ICD-10-CM | POA: Diagnosis not present

## 2014-10-09 DIAGNOSIS — I25119 Atherosclerotic heart disease of native coronary artery with unspecified angina pectoris: Secondary | ICD-10-CM | POA: Diagnosis not present

## 2014-10-09 DIAGNOSIS — R7309 Other abnormal glucose: Secondary | ICD-10-CM | POA: Diagnosis not present

## 2014-10-09 DIAGNOSIS — E119 Type 2 diabetes mellitus without complications: Secondary | ICD-10-CM | POA: Diagnosis not present

## 2014-10-12 ENCOUNTER — Encounter (HOSPITAL_COMMUNITY): Payer: Commercial Managed Care - HMO

## 2014-10-12 ENCOUNTER — Encounter (HOSPITAL_COMMUNITY)
Admission: RE | Admit: 2014-10-12 | Discharge: 2014-10-12 | Disposition: A | Discharge status: Home / Self Care | Payer: Commercial Managed Care - HMO | Source: Ambulatory Visit | Attending: Cardiovascular Disease | Admitting: Cardiovascular Disease

## 2014-10-12 DIAGNOSIS — Z955 Presence of coronary angioplasty implant and graft: Secondary | ICD-10-CM | POA: Diagnosis not present

## 2014-10-12 DIAGNOSIS — M25552 Pain in left hip: Secondary | ICD-10-CM | POA: Diagnosis not present

## 2014-10-12 DIAGNOSIS — M545 Low back pain: Secondary | ICD-10-CM | POA: Diagnosis not present

## 2014-10-14 ENCOUNTER — Encounter (HOSPITAL_COMMUNITY): Payer: Commercial Managed Care - HMO

## 2014-10-14 ENCOUNTER — Telehealth: Payer: Self-pay | Admitting: Physician Assistant

## 2014-10-14 ENCOUNTER — Encounter (HOSPITAL_COMMUNITY)
Admission: RE | Admit: 2014-10-14 | Discharge: 2014-10-14 | Disposition: A | Discharge status: Home / Self Care | Payer: Commercial Managed Care - HMO | Source: Ambulatory Visit | Attending: Cardiovascular Disease | Admitting: Cardiovascular Disease

## 2014-10-14 ENCOUNTER — Other Ambulatory Visit: Payer: Self-pay

## 2014-10-14 DIAGNOSIS — Z955 Presence of coronary angioplasty implant and graft: Secondary | ICD-10-CM | POA: Diagnosis not present

## 2014-10-14 NOTE — Telephone Encounter (Signed)
[place] to clarify medication strength for Isosorbide Mono ER. I informed [place] the patients medication Isosorbide mono ER was increased to 60 mg once daily on 10/06/14, ER discharge note and if there was anything else that they needed to give our office a call. [place] verbalized understanding.

## 2014-10-15 ENCOUNTER — Telehealth: Payer: Self-pay | Admitting: Cardiovascular Disease

## 2014-10-15 NOTE — Telephone Encounter (Signed)
Pt called in wanting to speak with Curt Bears about Crestor dosage being increased. Please call back  Thanks

## 2014-10-15 NOTE — Telephone Encounter (Signed)
Re: crestor dosing  Spoke to patient. Was d/c'ed from hospital recently - Angelena Form did discharge meds.  He stated concern about increased dose of Crestor - had previously been on 5mg  - did not feel comfortable taking 20mg  daily as his lipid numbers have been OK.  Wanted to make sure Crestor was supposed to be increased. For now, we discussed middle of the road - he feels comfortable & would be compliant w/ 10mg  daily.  Has f/u w/ Tarri Fuller in 2 weeks. Routing to [place] for any advice.

## 2014-10-16 ENCOUNTER — Encounter (HOSPITAL_COMMUNITY)
Admission: RE | Admit: 2014-10-16 | Discharge: 2014-10-16 | Disposition: A | Discharge status: Home / Self Care | Payer: Commercial Managed Care - HMO | Source: Ambulatory Visit | Attending: Cardiovascular Disease | Admitting: Cardiovascular Disease

## 2014-10-16 ENCOUNTER — Encounter (HOSPITAL_COMMUNITY): Payer: Commercial Managed Care - HMO

## 2014-10-16 DIAGNOSIS — Z955 Presence of coronary angioplasty implant and graft: Secondary | ICD-10-CM | POA: Diagnosis not present

## 2014-10-16 NOTE — Telephone Encounter (Signed)
Received feedback from [place] regarding rationale for dose increase - re: anti-inflammatory properties. She advised OK for pt to take 10mg  or 20mg  rosuvastatin daily.  Communicated this to patient. He voiced understanding, thanks - did appreciate the call & explanation. He plans to resume 20mg  daily dose - monitor for changes, SE's, will see Tarri Fuller on 5/20 & update w/ any concerns at this time.

## 2014-10-16 NOTE — Progress Notes (Signed)
Pt arrived at cardiac rehab reporting change in his medication regimen.  Medication list reconciled.

## 2014-10-19 ENCOUNTER — Encounter (HOSPITAL_COMMUNITY)
Admission: RE | Admit: 2014-10-19 | Discharge: 2014-10-19 | Disposition: A | Discharge status: Home / Self Care | Payer: Commercial Managed Care - HMO | Source: Ambulatory Visit | Attending: Cardiovascular Disease | Admitting: Cardiovascular Disease

## 2014-10-19 ENCOUNTER — Encounter (HOSPITAL_COMMUNITY): Payer: Commercial Managed Care - HMO

## 2014-10-19 DIAGNOSIS — Z955 Presence of coronary angioplasty implant and graft: Secondary | ICD-10-CM | POA: Diagnosis not present

## 2014-10-19 NOTE — Progress Notes (Signed)
Reginald Little 64 y.o. male Nutrition Note Spoke with pt.  Nutrition Survey reviewed with pt. Pt is following Step 2 of the Therapeutic Lifestyle Changes (TLC) diet according to pt survey results. Per discussion, feel pt following Step 1 of the TLC diet. Pt continues to eat skin on poultry, ribeyes, bologna, and pimiento cheese frequently. Pt states he has changed from fried food to "frying foods with my air fryer." Pt wants to lose wt. Pt states "I weighed 175 lbs until about 2 years ago." Pt feels wt gain due to decreased activity level due to cardiac meds (beta blocker) and pain in leg. Pt has been trying to lose wt by following "an 1800 kcal diet." Pt c/o wt loss plateau "I can't seem to get below 203 lb." Wt loss tips reviewed. Per discussion, pt seasoning food with No Salt. Pt discouraged from using No Salt. Pt expressed understanding of the information reviewed. Pt aware of nutrition education classes offered and plans on attending nutrition classes.  Nutrition Diagnosis ? Food-and nutrition-related knowledge deficit related to lack of exposure to information as related to diagnosis of: ? CVD  ? Obesity related to excessive energy intake as evidenced by a BMI of 32.2  Nutrition Intervention ? Benefits of adopting Therapeutic Lifestyle Changes discussed when Med[employer]s reviewed. ? Pt to attend the Portion Distortion class ? Pt to attend the  ? Nutrition I class                     ? Nutrition II class ? Pt given handouts for: ? 5-day menu ideas ? Continue client-centered nutrition education by RD, as part of interdisciplinary care.  Goal(s) ? Pt to identify and limit food sources of saturated fat, trans fat, and cholesterol ? Pt to identify food quantities necessary to achieve: ? wt loss to a goal wt of 187-205 lb (85.1-93.3 kg) at graduation from cardiac rehab.   Monitor and Evaluate progress toward nutrition goal with team.  Derek Mound, M.Ed, RD, LDN, CDE 10/19/2014 1:27 PM

## 2014-10-21 ENCOUNTER — Observation Stay (HOSPITAL_COMMUNITY)
Admission: EM | Admit: 2014-10-21 | Discharge: 2014-10-22 | Disposition: A | Discharge status: Home / Self Care | Payer: Commercial Managed Care - HMO | Attending: Cardiology | Admitting: Cardiology

## 2014-10-21 ENCOUNTER — Encounter (HOSPITAL_COMMUNITY)
Admission: RE | Admit: 2014-10-21 | Discharge: 2014-10-21 | Disposition: A | Discharge status: Home / Self Care | Payer: Commercial Managed Care - HMO | Source: Ambulatory Visit | Attending: Cardiovascular Disease | Admitting: Cardiovascular Disease

## 2014-10-21 ENCOUNTER — Emergency Department (HOSPITAL_COMMUNITY): Payer: Commercial Managed Care - HMO

## 2014-10-21 ENCOUNTER — Other Ambulatory Visit: Payer: Self-pay

## 2014-10-21 ENCOUNTER — Encounter (HOSPITAL_COMMUNITY): Payer: Commercial Managed Care - HMO

## 2014-10-21 ENCOUNTER — Other Ambulatory Visit: Payer: Self-pay | Admitting: *Deleted

## 2014-10-21 ENCOUNTER — Other Ambulatory Visit (HOSPITAL_COMMUNITY): Payer: Self-pay

## 2014-10-21 ENCOUNTER — Encounter (HOSPITAL_COMMUNITY): Payer: Self-pay | Admitting: Emergency Medicine

## 2014-10-21 ENCOUNTER — Telehealth (HOSPITAL_COMMUNITY): Payer: Self-pay | Admitting: *Deleted

## 2014-10-21 DIAGNOSIS — Z6830 Body mass index (BMI) 30.0-30.9, adult: Secondary | ICD-10-CM | POA: Insufficient documentation

## 2014-10-21 DIAGNOSIS — E785 Hyperlipidemia, unspecified: Secondary | ICD-10-CM | POA: Diagnosis present

## 2014-10-21 DIAGNOSIS — I2510 Atherosclerotic heart disease of native coronary artery without angina pectoris: Secondary | ICD-10-CM

## 2014-10-21 DIAGNOSIS — I739 Peripheral vascular disease, unspecified: Secondary | ICD-10-CM | POA: Insufficient documentation

## 2014-10-21 DIAGNOSIS — Z880 Allergy status to penicillin: Secondary | ICD-10-CM | POA: Diagnosis not present

## 2014-10-21 DIAGNOSIS — Z7982 Long term (current) use of aspirin: Secondary | ICD-10-CM | POA: Diagnosis not present

## 2014-10-21 DIAGNOSIS — Z87891 Personal history of nicotine dependence: Secondary | ICD-10-CM | POA: Diagnosis not present

## 2014-10-21 DIAGNOSIS — I10 Essential (primary) hypertension: Secondary | ICD-10-CM | POA: Diagnosis present

## 2014-10-21 DIAGNOSIS — R55 Syncope and collapse: Principal | ICD-10-CM | POA: Diagnosis present

## 2014-10-21 DIAGNOSIS — Z955 Presence of coronary angioplasty implant and graft: Secondary | ICD-10-CM | POA: Diagnosis not present

## 2014-10-21 DIAGNOSIS — R110 Nausea: Secondary | ICD-10-CM | POA: Diagnosis not present

## 2014-10-21 DIAGNOSIS — Z9861 Coronary angioplasty status: Secondary | ICD-10-CM

## 2014-10-21 DIAGNOSIS — R61 Generalized hyperhidrosis: Secondary | ICD-10-CM | POA: Diagnosis not present

## 2014-10-21 DIAGNOSIS — E669 Obesity, unspecified: Secondary | ICD-10-CM | POA: Insufficient documentation

## 2014-10-21 DIAGNOSIS — E66811 Obesity, class 1: Secondary | ICD-10-CM | POA: Diagnosis present

## 2014-10-21 DIAGNOSIS — K219 Gastro-esophageal reflux disease without esophagitis: Secondary | ICD-10-CM | POA: Diagnosis not present

## 2014-10-21 HISTORY — DX: Syncope and collapse: R55

## 2014-10-21 LAB — CBC WITH DIFFERENTIAL/PLATELET
BASOS ABS: 0.1 10*3/uL (ref 0.0–0.1)
Basophils Relative: 1 % (ref 0–1)
Eosinophils Absolute: 0.4 10*3/uL (ref 0.0–0.7)
Eosinophils Relative: 5 % (ref 0–5)
HCT: 42.1 % (ref 39.0–52.0)
Hemoglobin: 14.4 g/dL (ref 13.0–17.0)
LYMPHS PCT: 38 % (ref 12–46)
Lymphs Abs: 2.9 10*3/uL (ref 0.7–4.0)
MCH: 30.8 pg (ref 26.0–34.0)
MCHC: 34.2 g/dL (ref 30.0–36.0)
MCV: 90.1 fL (ref 78.0–100.0)
Monocytes Absolute: 0.5 10*3/uL (ref 0.1–1.0)
Monocytes Relative: 7 % (ref 3–12)
NEUTROS ABS: 3.8 10*3/uL (ref 1.7–7.7)
Neutrophils Relative %: 49 % (ref 43–77)
PLATELETS: 249 10*3/uL (ref 150–400)
RBC: 4.67 MIL/uL (ref 4.22–5.81)
RDW: 12.7 % (ref 11.5–15.5)
WBC: 7.6 10*3/uL (ref 4.0–10.5)

## 2014-10-21 LAB — CBC
HEMATOCRIT: 40 % (ref 39.0–52.0)
Hemoglobin: 13.1 g/dL (ref 13.0–17.0)
MCH: 29.7 pg (ref 26.0–34.0)
MCHC: 32.8 g/dL (ref 30.0–36.0)
MCV: 90.7 fL (ref 78.0–100.0)
Platelets: 235 10*3/uL (ref 150–400)
RBC: 4.41 MIL/uL (ref 4.22–5.81)
RDW: 12.7 % (ref 11.5–15.5)
WBC: 7.2 10*3/uL (ref 4.0–10.5)

## 2014-10-21 LAB — CREATININE, SERUM
Creatinine, Ser: 1.14 mg/dL (ref 0.61–1.24)
GFR calc Af Amer: >60 mL/min (ref >60)

## 2014-10-21 LAB — TROPONIN I: Troponin I: <0.03 ng/mL (ref <0.031)

## 2014-10-21 LAB — I-STAT TROPONIN, ED: Troponin i, poc: 0 ng/mL (ref 0.00–0.08)

## 2014-10-21 LAB — I-STAT CHEM 8, ED
BUN: 23 mg/dL — ABNORMAL HIGH (ref 6–20)
Calcium, Ion: 1.15 mmol/L (ref 1.13–1.30)
Chloride: 100 mmol/L — ABNORMAL LOW (ref 101–111)
Creatinine, Ser: 1.1 mg/dL (ref 0.61–1.24)
Glucose, Bld: 159 mg/dL — ABNORMAL HIGH (ref 70–99)
HEMATOCRIT: 46 % (ref 39.0–52.0)
HEMOGLOBIN: 15.6 g/dL (ref 13.0–17.0)
Potassium: 4.4 mmol/L (ref 3.5–5.1)
SODIUM: 140 mmol/L (ref 135–145)
TCO2: 24 mmol/L (ref 0–100)

## 2014-10-21 LAB — GLUCOSE, CAPILLARY: Glucose-Capillary: 165 mg/dL — ABNORMAL HIGH (ref 70–99)

## 2014-10-21 MED ORDER — ATENOLOL 50 MG PO TABS
50.0000 mg | ORAL_TABLET | Freq: Every day | ORAL | Status: DC
Start: 2014-10-22 — End: 2014-10-22
  Administered 2014-10-22: 50 mg via ORAL
  Filled 2014-10-21: qty 1

## 2014-10-21 MED ORDER — VITAMIN D3 25 MCG (1000 UT) PO CAPS: 2000.0000 [IU] | ORAL_CAPSULE | Freq: Every day | ORAL | Status: DC

## 2014-10-21 MED ORDER — ROSUVASTATIN CALCIUM 20 MG PO TABS
20.0000 mg | ORAL_TABLET | Freq: Every day | ORAL | Status: DC
Start: 2014-10-22 — End: 2014-10-22
  Administered 2014-10-22: 20 mg via ORAL
  Filled 2014-10-21: qty 1

## 2014-10-21 MED ORDER — ASPIRIN 300 MG RE SUPP
300.0000 mg | RECTAL | Status: AC
  Filled 2014-10-21: qty 1

## 2014-10-21 MED ORDER — ONDANSETRON HCL 4 MG/2ML IJ SOLN: 4.0000 mg | Freq: Four times a day (QID) | INTRAMUSCULAR | Status: DC | PRN

## 2014-10-21 MED ORDER — ACETAMINOPHEN 325 MG PO TABS: 650.0000 mg | ORAL_TABLET | ORAL | Status: DC | PRN

## 2014-10-21 MED ORDER — ISOSORBIDE MONONITRATE ER 60 MG PO TB24: 60.0000 mg | ORAL_TABLET | Freq: Every day | ORAL | Status: DC

## 2014-10-21 MED ORDER — CLOPIDOGREL BISULFATE 75 MG PO TABS
75.0000 mg | ORAL_TABLET | Freq: Every day | ORAL | Status: DC
Start: 2014-10-22 — End: 2014-10-22
  Administered 2014-10-22: 75 mg via ORAL
  Filled 2014-10-21: qty 1

## 2014-10-21 MED ORDER — NITROGLYCERIN 0.4 MG SL SUBL: 0.4000 mg | SUBLINGUAL_TABLET | SUBLINGUAL | Status: DC | PRN

## 2014-10-21 MED ORDER — ISOSORBIDE MONONITRATE ER 30 MG PO TB24
30.0000 mg | ORAL_TABLET | Freq: Every day | ORAL | Status: DC
  Administered 2014-10-22: 30 mg via ORAL
  Filled 2014-10-21: qty 1

## 2014-10-21 MED ORDER — ATENOLOL 50 MG PO TABS: 50.0000 mg | ORAL_TABLET | Freq: Every day | ORAL | Status: DC

## 2014-10-21 MED ORDER — PANTOPRAZOLE SODIUM 40 MG PO TBEC: 40.0000 mg | DELAYED_RELEASE_TABLET | Freq: Every day | ORAL | Status: DC

## 2014-10-21 MED ORDER — LOSARTAN POTASSIUM 100 MG PO TABS: 100.0000 mg | ORAL_TABLET | Freq: Every day | ORAL | Status: DC

## 2014-10-21 MED ORDER — SODIUM CHLORIDE 0.9 % IV BOLUS (SEPSIS): 250.0000 mL | Freq: Once | INTRAVENOUS | Status: DC

## 2014-10-21 MED ORDER — HEPARIN SODIUM (PORCINE) 5000 UNIT/ML IJ SOLN
5000.0000 [IU] | Freq: Three times a day (TID) | INTRAMUSCULAR | Status: DC
  Administered 2014-10-21: 5000 [IU] via SUBCUTANEOUS
  Filled 2014-10-21 (×3): qty 1

## 2014-10-21 MED ORDER — ASPIRIN EC 81 MG PO TBEC
81.0000 mg | DELAYED_RELEASE_TABLET | Freq: Every day | ORAL | Status: DC
  Administered 2014-10-22: 81 mg via ORAL
  Filled 2014-10-21: qty 1

## 2014-10-21 MED ORDER — VITAMIN D3 25 MCG (1000 UNIT) PO TABS
2000.0000 [IU] | ORAL_TABLET | Freq: Every day | ORAL | Status: DC
  Administered 2014-10-22: 2000 [IU] via ORAL
  Filled 2014-10-21: qty 2

## 2014-10-21 MED ORDER — SODIUM CHLORIDE 0.9 % IV BOLUS (SEPSIS)
250.0000 mL | Freq: Once | INTRAVENOUS | Status: AC
  Administered 2014-10-21: 250 mL via INTRAVENOUS

## 2014-10-21 MED ORDER — LOSARTAN POTASSIUM 50 MG PO TABS
100.0000 mg | ORAL_TABLET | Freq: Every day | ORAL | Status: DC
  Administered 2014-10-22: 100 mg via ORAL
  Filled 2014-10-21: qty 2

## 2014-10-21 MED ORDER — ASPIRIN 81 MG PO CHEW
324.0000 mg | CHEWABLE_TABLET | ORAL | Status: AC
  Administered 2014-10-21: 324 mg via ORAL
  Filled 2014-10-21: qty 4

## 2014-10-21 NOTE — Progress Notes (Signed)
Patient was sitting in cardiac education class. I was called from the door to come and check on the patient. Reginald Little was snoring loudly with his eyes open and was unresponsive. Patient checked for responsiveness, Code blue called. Patient has a spontaneous return of consciousness after a minute. Patient was diaphoretic and nauseated. Patient placed on the [facility] and on the stretcher. Rhythm Sinus 76.  Blood pressure 116/80. CBG 165. Code team responded. IV started patient was placed on Oxygen. Patient was transported to the ED via [facility] with MD, RN House Coverage RN Administrator and Rapid response RN. Code blue cancelled.Patient's sister called and notified of events.

## 2014-10-21 NOTE — Progress Notes (Signed)
Pt received to Room 2w27 from ED via wheelchair, alert and oriented x4, vitals stable. Pt admitted for observation due to an syncope episode. Will continue to monitor pt.

## 2014-10-21 NOTE — H&P (Signed)
Patient ID: Reginald Little MRN: [id number], DOB/AGE: [birth year]   Admit date: 10/21/2014   Primary Physician: Mathews Argyle, MD Primary Cardiologist: Dr. Gwenlyn Found  Pt. Profile:  64 year old Caucasian male with past medical history of CAD s/p DES x 2 to the RCA (December 2013 at [facility]) and DES of OM and PLA branches (December 2015), prior tobacco use, HTN and HLD who had recent cath which showed nonobstructive CAD present with syncope  Problem List  Past Medical History  Diagnosis Date  . Hypertension   . Obesity   . HLD (hyperlipidemia)   . Coronary artery disease     a. s/p DES x 2 to the RCA (05/2012 at [facility]) b. s/p DES of OM and PLA branches (05/2014)  c. s/p LHC on 10/06/14 with patent stents and non-obs CAD   . GERD (gastroesophageal reflux disease)   . H/O carotid endarterectomy     Past Surgical History  Procedure Laterality Date  . Coronary angioplasty with stent placement    . Carotid endarterectomy    . Left heart catheterization with coronary angiogram N/A 06/08/2014    Procedure: LEFT HEART CATHETERIZATION WITH CORONARY ANGIOGRAM;  Surgeon: Blane Ohara, MD;  Location: [facility] CATH LAB;  Service: Cardiovascular;  Laterality: N/A;  . Left heart catheterization with coronary angiogram N/A 10/06/2014    Procedure: LEFT HEART CATHETERIZATION WITH CORONARY ANGIOGRAM;  Surgeon: Leonie Man, MD;  Location: [facility] CATH LAB;  Service: Cardiovascular;  Laterality: N/A;     Allergies  Allergies  Allergen Reactions  . Penicillins Rash    HPI  The patient is a pleasant 64 year old Caucasian male with past medical history of CAD s/p DES x 2 to the RCA (December 2013 at [facility]) and DES of OM and PLA branches (December 2015), prior tobacco use, HTN and HLD. Patient recently underwent diagnostic cardiac catheterization on 10/05/2014 which showed two-vessel disease with patent stents throughout the entire mid RCA and posterolateral branch of RCA as well as OM 2. EF was 65%. There  was no explanation for his recurrent chest discomfort. He was eventually discharged on the following day to continue aggressive risk factor modification. His Imdur was increased to 60 mg on discharge and Crestor was also increased as well. He admits full compliance with medications since being discharged. He has been feeling okay at home however complaining of occasional hypotension with blood pressure in the 80s to 90s. He also has some occasional dizziness as well as when his blood pressure is low. Otherwise he has not experienced any recurrent chest discomfort.  He was sitting in cardiac rehabilitation education class in the morning of 10/21/2014, when he started having nausea and sweating profusely. He wasn't feeling very well and decided to lower his head to rest. Near the end of the cardiac rehabilitation education class, he was found to be unconscious by the cardiac rehabilitation nurse Verdis Frederickson. Per Maria's report, when he was found, he was snoring, however does not appears to be breathing very well. His eye was open but appears to be blank. He did not respond to waking up, CODE BLUE was called. He regained consciousness after one to 2 minute and has been asymptomatic since. He states, because the anticipation of cardiac rehabilitation class, he has taken all of his medication at the same time this morning instead of spacing them out like he does any other day. He states after the class, he was planning to meet up with his family to have lunch and does not want  to worry about taking medication at that time. He denies any chest discomfort before or after the syncopal episode.  He was subsequently sent to the ED for further workup. Troponin was negative. CBC and BMET was normal as well. Chest x-ray was negative for acute process. EKG showed motion artifact especially inferior lead, otherwise normal sinus rhythm without obvious ST-T wave changes. Cardiology has been consulted for syncope.  Home  Medications  Prior to Admission medications   Medication Sig Start Date End Date Taking? Authorizing Provider  aspirin EC 81 MG tablet Take 81 mg by mouth daily.   Yes Historical Provider, MD  atenolol (TENORMIN) 50 MG tablet Take 1 tablet (50 mg total) by mouth daily. 10/21/14  Yes Lorretta Harp, MD  CALCIUM PO Take 300 mg by mouth daily.   Yes Historical Provider, MD  Cholecalciferol (VITAMIN D3) 1000 UNITS CAPS Take 2,000 Units by mouth daily.    Yes Historical Provider, MD  clopidogrel (PLAVIX) 75 MG tablet Take 75 mg by mouth daily.   Yes Historical Provider, MD  isosorbide mononitrate (IMDUR) 60 MG 24 hr tablet Take 1 tablet (60 mg total) by mouth daily. 10/21/14  Yes Lorretta Harp, MD  losartan (COZAAR) 100 MG tablet Take 1 tablet (100 mg total) by mouth daily. 10/21/14  Yes Lorretta Harp, MD  Multiple Vitamins-Minerals (CENTRUM SILVER PO) Take 1 tablet by mouth daily.   Yes Historical Provider, MD  Omega-3 Fatty Acids (OMEGA-3 PO) Take 1,000 mg by mouth daily.    Yes Historical Provider, MD  pantoprazole (PROTONIX) 40 MG tablet Take 1 tablet (40 mg total) by mouth daily at 6 (six) AM. 10/06/14  Yes Eileen Stanford, PA-C  rosuvastatin (CRESTOR) 20 MG tablet Take 1 tablet (20 mg total) by mouth daily. 10/06/14  Yes Eileen Stanford, PA-C  nitroGLYCERIN (NITROSTAT) 0.4 MG SL tablet Place 1 tablet (0.4 mg total) under the tongue every 5 (five) minutes x 3 doses as needed for chest pain. 10/06/14   Eileen Stanford, PA-C    Family History  Family History  Problem Relation Age of Onset  . Heart attack Father 32  . COPD Mother 47    Social History  History   Social History  . Marital Status: Divorced    Spouse Name: N/A  . Number of Children: N/A  . Years of Education: N/A   Occupational History  . Owns transportation [place] History Main Topics  . Smoking status: Former Smoker    Quit date: 06/05/2012  . Smokeless tobacco: Not on file  .  Alcohol Use: No  . Drug Use: No  . Sexual Activity: Not on file   Other Topics Concern  . Not on file   Social History Narrative   Lives alone.     Review of Systems General:  No chills, fever, night sweats or weight changes.  Cardiovascular:  No chest pain, dyspnea on exertion, edema, orthopnea, palpitations, paroxysmal nocturnal dyspnea. Dermatological: No rash, lesions/masses Respiratory: No cough, dyspnea Urologic: No hematuria, dysuria Abdominal:   No vomiting, diarrhea, bright red blood per rectum, melena, or hematemesis Neurologic:  No visual changes. +syncope, weakness, nausea All other systems reviewed and are otherwise negative except as noted above.  Physical Exam  Blood pressure 96/53, pulse 67, temperature 97.9 F (36.6 C), temperature source Oral, resp. rate 22, SpO2 98 %.  General: Pleasant, NAD Psych: Normal affect. Neuro: Alert and oriented X 3. Moves all extremities spontaneously. HEENT:  Normal  Neck: Supple without bruits or JVD. Lungs:  Resp regular and unlabored, CTA. Heart: RRR no s3, s4. 1/6 murmur along L subclavian Abdomen: Soft, non-tender, non-distended, BS + x 4.  Extremities: No clubbing, cyanosis or edema. DP/PT/Radials 2+ and equal bilaterally.  Labs  Troponin [facility] Test)  Recent Labs  10/21/14 1144  TROPIPOC 0.00   No results for input(s): CKTOTAL, CKMB, TROPONINI in the last 72 hours. Lab Results  Component Value Date   WBC 7.6 10/21/2014   HGB 15.6 10/21/2014   HCT 46.0 10/21/2014   MCV 90.1 10/21/2014   PLT 249 10/21/2014    Recent Labs Lab 10/21/14 1148  NA 140  K 4.4  CL 100*  BUN 23*  CREATININE 1.10  GLUCOSE 159*   Lab Results  Component Value Date   CHOL 128 10/06/2014   HDL 29* 10/06/2014   LDLCALC 80 10/06/2014   TRIG 96 10/06/2014   No results found for: DDIMER   Radiology/Studies  Dg Chest 2 View  10/05/2014   CLINICAL DATA:  Chest pain  EXAM: CHEST  2 VIEW  COMPARISON:  08/11/2014   FINDINGS: Normal heart size and aortic contours. Coronary stents noted. There is no edema, consolidation, effusion, or pneumothorax. No osseous findings to explain chest pain. Surgical clips in the left neck correlating with carotid endarterectomy history.  IMPRESSION: No active cardiopulmonary disease.   Electronically Signed   By: Monte Fantasia M.D.   On: 10/05/2014 13:55   Dg Chest Port 1 View  10/21/2014   CLINICAL DATA:  63 year old male with acute onset nausea, diaphoresis and syncope while at cardiac rehab. Initial encounter.  EXAM: PORTABLE CHEST - 1 VIEW  COMPARISON:  10/05/2014 and earlier.  FINDINGS: Portable AP view at 1141 hrs. Normal cardiac size and mediastinal contours. Visualized tracheal air column is within normal limits. Allowing for portable technique, the lungs are clear. No pneumothorax or pleural effusion.  IMPRESSION: Negative, no acute cardiopulmonary abnormality.   Electronically Signed   By: Genevie Ann M.D.   On: 10/21/2014 11:50    ECG  NSR without significant ST-T wave changes  Echocardiogram 06/07/2015  LV EF: 55% -  60%  ------------------------------------------------------------------- Indications:   Chest pain 786.51.  ------------------------------------------------------------------- History:  PMH: Former Smoker, GERD, 2 stents placed in 2013. Coronary artery disease. Angina pectoris.  ------------------------------------------------------------------- Study Conclusions  - Left ventricle: The cavity size was normal. Wall thickness was increased in a pattern of mild LVH. Systolic function was normal. The estimated ejection fraction was in the range of 55% to 60%. Wall motion was normal; there were no regional wall motion abnormalities. Doppler parameters are consistent with abnormal left ventricular relaxation (grade 1 diastolic dysfunction). Doppler parameters are consistent with high ventricular filling pressure. - Aortic root:  The aortic root was mildly dilated. - Mitral valve: Calcified annulus.  Impressions:  - Normal LV function; grade 1 diastolic dysfuntion; mild LVH; mildly dilated aortic root.    ASSESSMENT AND PLAN  1. Syncope  - Likely related to blood pressure, pending orthostatic vital signs, however under the circumstances, trigger of syncope is unlikely orthostatic related as the patient has been sitting the entire time.  - Will need to decrease his blood pressure medication as patient has been having dizziness at home associated with blood pressure in the 80s to 90s  - discussed with MD, will admit for obs   - Given lack of chest pain and EKG changes, very unlikely to be cardiac related. EF  65% on recent cath, no h/o arrythmia.  2. CAD s/p DES x 2 to the RCA (December 2013 at [facility]) and DES of OM and PLA branches (December 2015) 3. prior tobacco use 4. HTN  5. HLD 6. Mild L subclavian artery stenosis with murmur on exam   Signed, Almyra Deforest, PA-C 10/21/2014, 2:39 PM The patient describes not feeling as well since his isosorbide mononitrate was increased from 30-60 mg daily.  He has felt more lightheaded.  Today he had an episode of syncope while sitting in a room watching a video at cardiac rehabilitation program.  He had doubled up on some of his morning medicines prior to going to the program which may have exacerbated his tendency toward  Hypotension.  He states that at home his blood pressure in his left arm when he checks it is typically much lower than it is in his right arm.  Yesterday his left arm blood pressure systolics were running in the 80s and 90s while his right arm blood pressures at home were in the 130 range.  Recent carotid duplex ultrasound had shown subclavian stenosis less than 50%.  He does have a prominent systolic bruit audible over the left upper chest subclavian area.  He had an echocardiogram in December 2015 which showed normal left ventricular systolic function and grade  1 diastolic dysfunction. His EKG was personally reviewed and does not show any ischemic changes. We will plan to admit the patient overnight for observation.  Anticipate discharge in a.m. if no further problems.  Continue outpatient follow-up of his peripheral vascular disease with Dr. Gwenlyn Found.  We will reduce his isosorbide mononitrate back to 30 mg daily.

## 2014-10-21 NOTE — ED Provider Notes (Signed)
CSN: [id number]     Arrival date & time 10/21/14  1130 History   First MD Initiated Contact with Patient 10/21/14 1132     Chief Complaint  Patient presents with  . Loss of Consciousness     Patient is a 64 y.o. male presenting with syncope. The history is provided by the patient. No language interpreter was used.  Loss of Consciousness  Reginald Little presents from Cardiac Rehab for evaluation of syncope.  He was sitting in a class and developed nausea.  Per report pt was unresponsive with eyes open, snoring respirations, unresponsive for about 1 minute.  He was noted to be diaphoretic as well.  He has no recollection of the event other than preceding nausea. Currently he has no complaints. Yesterday he had an episode of nausea that he attributed to sour stomach.  At that time he took multiple blood pressures and they were noted to be low in the left arm compared to the right.  Sxs are moderate and waxing and waning.  He denies any chest pain, fevers, cough, sob, abdominal pain, vomiting, diarrhea, lower extremity edema.    Past Medical History  Diagnosis Date  . Hypertension   . Obesity   . HLD (hyperlipidemia)   . Coronary artery disease     a. s/p DES x 2 to the RCA (05/2012 at [facility]) b. s/p DES of OM and PLA branches (05/2014)  c. s/p LHC on 10/06/14 with patent stents and non-obs CAD   . GERD (gastroesophageal reflux disease)   . H/O carotid endarterectomy    Past Surgical History  Procedure Laterality Date  . Coronary angioplasty with stent placement    . Carotid endarterectomy    . Left heart catheterization with coronary angiogram N/A 06/08/2014    Procedure: LEFT HEART CATHETERIZATION WITH CORONARY ANGIOGRAM;  Surgeon: Blane Ohara, MD;  Location: [facility] CATH LAB;  Service: Cardiovascular;  Laterality: N/A;  . Left heart catheterization with coronary angiogram N/A 10/06/2014    Procedure: LEFT HEART CATHETERIZATION WITH CORONARY ANGIOGRAM;  Surgeon: Leonie Man, MD;  Location: [facility]  CATH LAB;  Service: Cardiovascular;  Laterality: N/A;   Family History  Problem Relation Age of Onset  . Heart attack Father 53  . COPD Mother 74   History  Substance Use Topics  . Smoking status: Former Smoker    Quit date: 06/05/2012  . Smokeless tobacco: Not on file  . Alcohol Use: No    Review of Systems  Cardiovascular: Positive for syncope.  All other systems reviewed and are negative.     Allergies  Penicillins  Home Medications   Prior to Admission medications   Medication Sig Start Date End Date Taking? Authorizing Provider  aspirin EC 81 MG tablet Take 81 mg by mouth daily.    Historical Provider, MD  atenolol (TENORMIN) 50 MG tablet Take 1 tablet (50 mg total) by mouth daily. 10/21/14   Lorretta Harp, MD  Cholecalciferol (VITAMIN D3) 1000 UNITS CAPS Take 2,000 Units by mouth daily.     Historical Provider, MD  clopidogrel (PLAVIX) 75 MG tablet Take 75 mg by mouth daily.    Historical Provider, MD  co-enzyme Q-10 50 MG capsule Take 300 mg by mouth daily.    Historical Provider, MD  isosorbide mononitrate (IMDUR) 60 MG 24 hr tablet Take 1 tablet (60 mg total) by mouth daily. 10/21/14   Lorretta Harp, MD  loratadine (CLARITIN) 10 MG tablet Take 10 mg by mouth daily as  needed for allergies.    Historical Provider, MD  losartan (COZAAR) 100 MG tablet Take 1 tablet (100 mg total) by mouth daily. 10/21/14   Lorretta Harp, MD  Multiple Vitamins-Minerals (CENTRUM SILVER PO) Take 1 tablet by mouth daily.    Historical Provider, MD  nitroGLYCERIN (NITROSTAT) 0.4 MG SL tablet Place 1 tablet (0.4 mg total) under the tongue every 5 (five) minutes x 3 doses as needed for chest pain. 10/06/14   Eileen Stanford, PA-C  Omega-3 Fatty Acids (OMEGA-3 PO) Take 1,000 mg by mouth daily.     Historical Provider, MD  pantoprazole (PROTONIX) 40 MG tablet Take 1 tablet (40 mg total) by mouth daily at 6 (six) AM. 10/06/14   Eileen Stanford, PA-C  rosuvastatin (CRESTOR) 20 MG tablet  Take 1 tablet (20 mg total) by mouth daily. 10/06/14   Eileen Stanford, PA-C   BP 120/74 mmHg  Pulse 78  Temp(Src) 97.9 F (36.6 C) (Oral)  Resp 20  SpO2 99% Physical Exam  Constitutional: He is oriented to person, place, and time. He appears well-developed and well-nourished.  HENT:  Head: Normocephalic and atraumatic.  Cardiovascular: Normal rate and regular rhythm.   No murmur heard. Pulmonary/Chest: Effort normal and breath sounds normal. No respiratory distress.  Abdominal: Soft. There is no tenderness. There is no rebound and no guarding.  Musculoskeletal: He exhibits no edema or tenderness.  Neurological: He is alert and oriented to person, place, and time.  Skin: Skin is warm and dry.  Psychiatric: He has a normal mood and affect. His behavior is normal.  Nursing note and vitals reviewed.   ED Course  Procedures (including critical care time) Labs Review Labs Reviewed  I-STAT CHEM 8, ED - Abnormal; Notable for the following:    Chloride 100 (*)    BUN 23 (*)    Glucose, Bld 159 (*)    All other components within normal limits  CBC WITH DIFFERENTIAL/PLATELET  CBC  CREATININE, SERUM  TROPONIN I  TROPONIN I  TROPONIN I  BASIC METABOLIC PANEL  [name], ED    Imaging Review Dg Chest Port 1 View  10/21/2014   CLINICAL DATA:  64 year old male with acute onset nausea, diaphoresis and syncope while at cardiac rehab. Initial encounter.  EXAM: PORTABLE CHEST - 1 VIEW  COMPARISON:  10/05/2014 and earlier.  FINDINGS: Portable AP view at 1141 hrs. Normal cardiac size and mediastinal contours. Visualized tracheal air column is within normal limits. Allowing for portable technique, the lungs are clear. No pneumothorax or pleural effusion.  IMPRESSION: Negative, no acute cardiopulmonary abnormality.   Electronically Signed   By: Genevie Ann M.D.   On: 10/21/2014 11:50     EKG Interpretation None      MDM   Final diagnoses:  None    Patient here from cardiac  rehabilitation for evaluation of syncope. Patient no acute distress and ED. Concern for potential cardiogenic source of syncope. Discussed with cardiology for evaluation and admission.    Quintella Reichert, MD 10/21/14 [id number]

## 2014-10-21 NOTE — ED Notes (Signed)
Pt states that his b/p normally runs low in his left arm

## 2014-10-21 NOTE — Progress Notes (Signed)
CODE BLUE NOTE  Called to CODE BLUE at 1100. Patient had been attending an educational class at cardiac rehabilitation. After the educational video had stopped, he was noted to be unresponsive and snoring. CODE BLUE was called. At the time of arrival, patient was fully alert and oriented. He complained of nausea but no chest pain. He was noted to be saturating in the high 90s on room air. Rhythm strip unremarkable.  He was transferred to the emergency room for 12-lead EKG and further care.  Arman Filter, MD, PhD Internal Medicine Intern 10/21/2014,1:54 PM

## 2014-10-21 NOTE — ED Notes (Signed)
Pt coming in today from cardiac rehab which is in the hospital. The pt was in a heart rehab class when he suddenly became nauseated and diaphoretic followed by a syncopal episode. Pt was syncopal for around 1 minute and then woke up. BP: 113/85, CBG:165 Pt alert x4 at this time. NAD at this time. Pt denies all pain at this time.

## 2014-10-21 NOTE — Telephone Encounter (Signed)
Rx refill sent to patient pharmacy for 90 day supply.  

## 2014-10-21 NOTE — Telephone Encounter (Signed)
Pt emergently sent to Er for further evaluation.  Pt asked that his sister, Mardene Celeste be called.  Called and spoke to his sister. Relayed events to his sister.  Pt sister plans to come to the ER for emotional support. Cherre Huger, BSN

## 2014-10-21 NOTE — ED Notes (Signed)
Lab results of I-Stat Troponin of 0.00 and I-Stat Chem 8 reported to Dr.Rees not crossing over at this time.

## 2014-10-22 ENCOUNTER — Encounter (HOSPITAL_COMMUNITY): Payer: Self-pay | Admitting: General Practice

## 2014-10-22 DIAGNOSIS — I10 Essential (primary) hypertension: Secondary | ICD-10-CM

## 2014-10-22 DIAGNOSIS — Z9861 Coronary angioplasty status: Secondary | ICD-10-CM

## 2014-10-22 DIAGNOSIS — R55 Syncope and collapse: Secondary | ICD-10-CM | POA: Diagnosis not present

## 2014-10-22 DIAGNOSIS — I2510 Atherosclerotic heart disease of native coronary artery without angina pectoris: Secondary | ICD-10-CM | POA: Diagnosis not present

## 2014-10-22 DIAGNOSIS — E785 Hyperlipidemia, unspecified: Secondary | ICD-10-CM | POA: Diagnosis not present

## 2014-10-22 DIAGNOSIS — Z955 Presence of coronary angioplasty implant and graft: Secondary | ICD-10-CM | POA: Diagnosis not present

## 2014-10-22 DIAGNOSIS — Z6830 Body mass index (BMI) 30.0-30.9, adult: Secondary | ICD-10-CM | POA: Diagnosis not present

## 2014-10-22 DIAGNOSIS — E669 Obesity, unspecified: Secondary | ICD-10-CM | POA: Diagnosis not present

## 2014-10-22 DIAGNOSIS — K219 Gastro-esophageal reflux disease without esophagitis: Secondary | ICD-10-CM | POA: Diagnosis not present

## 2014-10-22 DIAGNOSIS — I739 Peripheral vascular disease, unspecified: Secondary | ICD-10-CM | POA: Diagnosis not present

## 2014-10-22 MED ORDER — ISOSORBIDE MONONITRATE ER 60 MG PO TB24: 30.0000 mg | ORAL_TABLET | Freq: Every day | ORAL | Status: DC

## 2014-10-22 NOTE — Discharge Summary (Signed)
Discharge Summary   Patient ID: Reginald Little,  MRN: [id number], DOB/AGE: Sep 03, 1950 64 y.o.  Admit date: 10/21/2014 Discharge date: 10/22/2014  Primary Care Provider: Mathews Argyle Primary Cardiologist: Adora Fridge, MD   Discharge Diagnoses Principal Problem:   Syncope Active Problems:   Essential hypertension   Obesity (BMI 30.0-34.9)   CAD S/P percutaneous coronary angioplasty   Hyperlipidemia  Allergies Allergies  Allergen Reactions  . Penicillins Rash    Procedures  None  History of Present Illness  64 year old male with a prior history of coronary artery disease status post remote stent placement to the RCA in 2013 and subsequent stenting of the obtuse marginal and PLA in December 2015. He also has a history of hypertension hyperlipidemia as well as peripheral vascular disease. He recently underwent diagnostic catheterization in the setting of recurrent chest discomfort on April 25 revealing patent stents without target for intervention. His home dose of isosorbide mononitrate was increased from 30 mg to 60 mg daily. Since then, he has noted intermittent relative hypotension with associated dizziness. On the day of admission, he was sitting in a cardiac rehabilitation education class when he experienced nausea and diaphoresis. He decided to rest his head but was noted by the rehabilitation nurse to be unresponsive. A CODE BLUE was initially called but then patient regained consciousness within 1-2 minutes and was asymptomatic upon arousal. He was taken to the emergency department for evaluation where labs were unrevealing and ECG was nonacute. He was observed overnight for further evaluation.  Hospital Course  Patient ruled out for myocardial infarction. It was felt that given recent symptomatic hypotension since his Imdur dose was increased, that he likely developed a hypotensive episode while in cardiac rehabilitation. He has been hemodynamically stable since admission  without any arrhythmias on the monitor. His Imdur dose has been down titrated back to 30 mg daily. He'll be discharged home today in good condition.  Discharge Vitals Blood pressure 135/66, pulse 71, temperature 97.7 F (36.5 C), temperature source Oral, resp. rate 18, height 5\' 8"  (1.727 m), weight 203 lb (92.08 kg), SpO2 98 %.  Filed Weights   10/21/14 1656  Weight: 203 lb (92.08 kg)    Labs  CBC  Recent Labs  10/21/14 1134 10/21/14 1148 10/21/14 1933  WBC 7.6  --  7.2  NEUTROABS 3.8  --   --   HGB 14.4 15.6 13.1  HCT 42.1 46.0 40.0  MCV 90.1  --  90.7  PLT 249  --  856   Basic Metabolic Panel  Recent Labs  10/21/14 1148 10/21/14 1933  NA 140  --   K 4.4  --   CL 100*  --   GLUCOSE 159*  --   BUN 23*  --   CREATININE 1.10 1.14   Cardiac Enzymes  Recent Labs  10/21/14 1933  TROPONINI <0.03   Disposition  Pt is being discharged home today in good condition.  Follow-up Plans & Appointments  Follow-up Information    Follow up with [facility], NP On 11/04/2014.   Specialties:  Cardiology, Radiology   Why:  2:00 PM - Dr. Kennon Holter Nurse Practitioner   Contact information:   [address] [place] [address] [phone]       Follow up with Mathews Argyle, MD.   Specialty:  Internal Medicine   Why:  as scheduled.   Contact information:   [address] [place] [address] [phone]       Discharge Medications  Medication List    TAKE these medications        aspirin EC 81 MG tablet  Take 81 mg by mouth daily.     atenolol 50 MG tablet  Commonly known as:  TENORMIN  Take 1 tablet (50 mg total) by mouth daily.     CALCIUM PO  Take 300 mg by mouth daily.     CENTRUM SILVER PO  Take 1 tablet by mouth daily.     clopidogrel 75 MG tablet  Commonly known as:  PLAVIX  Take 75 mg by mouth daily.     isosorbide mononitrate 60 MG 24 hr tablet  Commonly known as:  IMDUR  Take 0.5 tablets (30  mg total) by mouth daily.     losartan 100 MG tablet  Commonly known as:  COZAAR  Take 1 tablet (100 mg total) by mouth daily.     nitroGLYCERIN 0.4 MG SL tablet  Commonly known as:  NITROSTAT  Place 1 tablet (0.4 mg total) under the tongue every 5 (five) minutes x 3 doses as needed for chest pain.     OMEGA-3 PO  Take 1,000 mg by mouth daily.     pantoprazole 40 MG tablet  Commonly known as:  PROTONIX  Take 1 tablet (40 mg total) by mouth daily at 6 (six) AM.     rosuvastatin 20 MG tablet  Commonly known as:  CRESTOR  Take 1 tablet (20 mg total) by mouth daily.     Vitamin D3 1000 UNITS Caps  Take 2,000 Units by mouth daily.        Outstanding Labs/Studies  None  Duration of Discharge Encounter   Greater than 30 minutes including physician time.  Signed, Murray Hodgkins NP 10/22/2014, 9:31 AM    I have examined the patient and reviewed assessment and plan and discussed with patient.  Agree with above as stated.   Syncope: likely hypotension after increased Imdur. Plan on decreasing Imdur to 30 mg daily. COntinue other meds. PV w/u per Dr. Gwenlyn Found. Plan d/c later today.  Kallum Jorgensen S.

## 2014-10-22 NOTE — Progress Notes (Signed)
SUBJECTIVE:  Feels well.  Wants to go home.    OBJECTIVE:   Vitals:   Filed Vitals:   10/21/14 1601 10/21/14 1656 10/21/14 2009 10/22/14 0417  BP:  120/52 157/67 135/66  Pulse: 75 68 80 71  Temp:  98.2 F (36.8 C) 97.8 F (36.6 C) 97.7 F (36.5 C)  TempSrc:  Oral Oral Oral  Resp: 19 18 18 18   Height:  5\' 8"  (1.727 m)    Weight:  203 lb (92.08 kg)    SpO2: 96% 100% 100% 98%   I&O's:   Intake/Output Summary (Last 24 hours) at 10/22/14 0900 Last data filed at 10/22/14 0730  Gross per 24 hour  Intake    600 ml  Output    650 ml  Net    -50 ml   TELEMETRY: Reviewed telemetry pt in NSR:     PHYSICAL EXAM General: Well developed, well nourished, in no acute distress Head:   Normal cephalic and atramatic  Lungs:   Clear bilaterally to auscultation. Heart:   HRRR S1 S2  No JVD.   Abdomen: abdomen soft and non-tender Msk:  Back normal,  Normal strength and tone for age. Extremities:   No edema.   Neuro: Alert and oriented. Psych:  Normal affect, responds appropriately Skin: No rash   LABS: Basic Metabolic Panel:  Recent Labs  10/21/14 1148 10/21/14 1933  NA 140  --   K 4.4  --   CL 100*  --   GLUCOSE 159*  --   BUN 23*  --   CREATININE 1.10 1.14   Liver Function Tests: No results for input(s): AST, ALT, ALKPHOS, BILITOT, PROT, ALBUMIN in the last 72 hours. No results for input(s): LIPASE, AMYLASE in the last 72 hours. CBC:  Recent Labs  10/21/14 1134 10/21/14 1148 10/21/14 1933  WBC 7.6  --  7.2  NEUTROABS 3.8  --   --   HGB 14.4 15.6 13.1  HCT 42.1 46.0 40.0  MCV 90.1  --  90.7  PLT 249  --  235   Cardiac Enzymes:  Recent Labs  10/21/14 1933  TROPONINI <0.03   BNP: Invalid input(s): POCBNP D-Dimer: No results for input(s): DDIMER in the last 72 hours. Hemoglobin A1C: No results for input(s): HGBA1C in the last 72 hours. Fasting Lipid Panel: No results for input(s): CHOL, HDL, LDLCALC, TRIG, CHOLHDL, LDLDIRECT in the last 72  hours. Thyroid Function Tests: No results for input(s): TSH, T4TOTAL, T3FREE, THYROIDAB in the last 72 hours.  Invalid input(s): FREET3 Anemia Panel: No results for input(s): VITAMINB12, FOLATE, FERRITIN, TIBC, IRON, RETICCTPCT in the last 72 hours. Coag Panel:   Lab Results  Component Value Date   INR 1.04 10/05/2014   INR 1.04 06/06/2014    RADIOLOGY: Dg Chest 2 View  10/05/2014   CLINICAL DATA:  Chest pain  EXAM: CHEST  2 VIEW  COMPARISON:  08/11/2014  FINDINGS: Normal heart size and aortic contours. Coronary stents noted. There is no edema, consolidation, effusion, or pneumothorax. No osseous findings to explain chest pain. Surgical clips in the left neck correlating with carotid endarterectomy history.  IMPRESSION: No active cardiopulmonary disease.   Electronically Signed   By: Monte Fantasia M.D.   On: 10/05/2014 13:55   Dg Chest Port 1 View  10/21/2014   CLINICAL DATA:  64 year old male with acute onset nausea, diaphoresis and syncope while at cardiac rehab. Initial encounter.  EXAM: PORTABLE CHEST - 1 VIEW  COMPARISON:  10/05/2014 and earlier.  FINDINGS: Portable AP view at 1141 hrs. Normal cardiac size and mediastinal contours. Visualized tracheal air column is within normal limits. Allowing for portable technique, the lungs are clear. No pneumothorax or pleural effusion.  IMPRESSION: Negative, no acute cardiopulmonary abnormality.   Electronically Signed   By: Genevie Ann M.D.   On: 10/21/2014 11:50      ASSESSMENT: CAD, syncope  PLAN:  Syncope: likely hypotension after increased Imdur.  Plan on decreasing Imdur to 30 mg daily. COntinue other meds.  PV w/u per Dr. Gwenlyn Found.  Plan d/c later today.  Jettie Booze, MD  10/22/2014  9:00 AM

## 2014-10-22 NOTE — Progress Notes (Signed)
Discharge instructions reviewed at bedside with patient.  Teach back effective as patient repeated medication administration accurately concerning which medications he needed to take and not take at home.  Patient with no questions or concerns.  Both peripheral IV's discontinued with no complications upon removal.  Patient alert and oriented X4 and declined assistance in exiting the floor/hospital.  Discharge instructions copied and placed in the chart.

## 2014-10-22 NOTE — Discharge Instructions (Signed)
***  PLEASE REMEMBER TO BRING ALL OF YOUR MEDICATIONS TO EACH OF YOUR FOLLOW-UP OFFICE VISITS.  

## 2014-10-23 ENCOUNTER — Telehealth (HOSPITAL_COMMUNITY): Payer: Self-pay | Admitting: *Deleted

## 2014-10-23 ENCOUNTER — Encounter (HOSPITAL_COMMUNITY): Admission: RE | Admit: 2014-10-23 | Payer: Commercial Managed Care - HMO | Source: Ambulatory Visit

## 2014-10-23 ENCOUNTER — Encounter (HOSPITAL_COMMUNITY): Payer: Commercial Managed Care - HMO

## 2014-10-23 ENCOUNTER — Telehealth: Payer: Self-pay | Admitting: Cardiovascular Disease

## 2014-10-23 MED ORDER — LOSARTAN POTASSIUM 100 MG PO TABS: 50.0000 mg | ORAL_TABLET | Freq: Every day | ORAL | Status: DC

## 2014-10-23 NOTE — Telephone Encounter (Signed)
Pt called to let us know that he was discharged from the hospital on yesterday.  Pt with questions regarding his follow up appts. He has one scheduled for 5/20, 5/25.  It appears the appt on 5/20 was scheduled at his last office visit and the 5/25 appt was made at the time of discharge.  [facility] office called.  Clarified with office staff which appt should be kept.  Will cancel the 5/20 appt and keep the appt on 5/25.  Asked for medical clearance to resume exercise. Dr. Gwenlyn Found out of the office until 5/15.  In basket sent to [facility], Dr. Gwenlyn Found nurse. Pt called and told to hold on exercise until notified by rehab staff.  Pt understands his follow up appt is on 5/25. Cherre Huger, BSN

## 2014-10-23 NOTE — Telephone Encounter (Signed)
New Message   Patient needs to speak to a nurse about medication. Please give patient a call back.

## 2014-10-23 NOTE — Telephone Encounter (Signed)
-----   Message from Rogelia Mire, NP sent at 10/23/2014  9:50 AM EDT ----- Regarding: RE: when may pt resume cardiac rehab? He's ok to resume rehab without restrictions now.  Nothing abnl noted during hospitalization.  Thanks, Gerald Stabs ----- Message -----    From: Rowe Pavy, RN    Sent: 10/23/2014   8:54 AM      To: Chauncy Lean, RN, Rogelia Mire, NP Subject: when may pt resume cardiac rehab?              Pt with recent overnight admission from the hospital due to syncopal episode during cardiac rehab education class where Code blue was initiated due to his unresponsiveness. Medications changed/decreased and he was deemed appropriate for discharge on yesterday.   Follow up appt in [facility] office with Cecilie Kicks on 5/25 and will see Dr. Gwenlyn Found on 6/14.  When may patient resume exercise?  Any restrictions with activity?  See that Dr. Gwenlyn Found is out of the office through 5/15 instructed pt to hold on exercise until clearance received.   Thanks for the input [employer] RN

## 2014-10-23 NOTE — Telephone Encounter (Signed)
Spoke with pt, he is still having some lightheadedness and a faint feeling. His bp is 117/57. His losartan was increased at the last office visit with dr berry. His isosorbide was decreased to 30 mg daily at last discharge. He will decrease his losartan to 50 mg daily. He will track his bp at home and bring those readings to his f/u appt 11-04-14 with laura ingold np. He will call prior to the appt if symptoms cont or change. Pt agreed with this plan.

## 2014-10-26 ENCOUNTER — Encounter (HOSPITAL_COMMUNITY)
Admission: RE | Admit: 2014-10-26 | Discharge: 2014-10-26 | Disposition: A | Discharge status: Home / Self Care | Payer: Commercial Managed Care - HMO | Source: Ambulatory Visit | Attending: Cardiovascular Disease | Admitting: Cardiovascular Disease

## 2014-10-26 ENCOUNTER — Telehealth (HOSPITAL_COMMUNITY): Payer: Self-pay | Admitting: *Deleted

## 2014-10-26 ENCOUNTER — Encounter (HOSPITAL_COMMUNITY): Payer: Commercial Managed Care - HMO

## 2014-10-26 DIAGNOSIS — Z955 Presence of coronary angioplasty implant and graft: Secondary | ICD-10-CM | POA: Diagnosis not present

## 2014-10-26 NOTE — Progress Notes (Signed)
Pt returned back to rehab today after clearance to return due to recent discharge from hospital.  Pt tolerated exercise with no complaints.  Vital signs wnl. QUALITY OF LIFE SCORE REVIEW Patient score low in area of health and function and family with a score of Health and Function 18.86 and Family 18.00 Scores less than 21 are considered low.  This was rescored after pt mistakenly answered incorrectly several questions. Patient quality of life slightly altered by physical constraints which limits ability to perform as prior to recent cardiac illness. Pt longs for the days when he was active and able to work in the yard all day.  Pt has not accepted the aging process and some activities as he gets older are not easily achievable. As a result of his low energy he has refrained from being with his friends who he played golf with in the past.  Pt denies feeling depressed but admits that because of his low energy he feels depressed.  Pt declined offered counseling support. Family - pt regrets not having any children of his own.  He does not have a relationship with his step children after the divorce.  Pt periodically sees his ex-wife for dinner.  Pt does not feel a reconciliation is in the future.  Pt is content with the way his relationship is and does not desire a sexual relationship.  Offered emotional support and reassurance.  Will continue to monitor and intervene as necessary.  Cherre Huger, BSN

## 2014-10-26 NOTE — Telephone Encounter (Signed)
Pt notified of medical clearance to return to exercise today.  Pt stated he will be here later this morning for the 11:15 exercise class. Cherre Huger, BSN

## 2014-10-28 ENCOUNTER — Encounter (HOSPITAL_COMMUNITY)
Admission: RE | Admit: 2014-10-28 | Discharge: 2014-10-28 | Disposition: A | Discharge status: Home / Self Care | Payer: Commercial Managed Care - HMO | Source: Ambulatory Visit | Attending: Cardiovascular Disease | Admitting: Cardiovascular Disease

## 2014-10-28 ENCOUNTER — Encounter (HOSPITAL_COMMUNITY): Payer: Commercial Managed Care - HMO

## 2014-10-28 DIAGNOSIS — Z955 Presence of coronary angioplasty implant and graft: Secondary | ICD-10-CM | POA: Diagnosis not present

## 2014-10-30 ENCOUNTER — Ambulatory Visit: Payer: Self-pay | Admitting: Physician Assistant

## 2014-10-30 ENCOUNTER — Encounter (HOSPITAL_COMMUNITY)
Admission: RE | Admit: 2014-10-30 | Discharge: 2014-10-30 | Disposition: A | Discharge status: Home / Self Care | Payer: Commercial Managed Care - HMO | Source: Ambulatory Visit | Attending: Cardiovascular Disease | Admitting: Cardiovascular Disease

## 2014-10-30 ENCOUNTER — Encounter (HOSPITAL_COMMUNITY): Payer: Commercial Managed Care - HMO

## 2014-10-30 DIAGNOSIS — Z955 Presence of coronary angioplasty implant and graft: Secondary | ICD-10-CM | POA: Diagnosis not present

## 2014-11-02 ENCOUNTER — Encounter (HOSPITAL_COMMUNITY): Payer: Commercial Managed Care - HMO

## 2014-11-02 ENCOUNTER — Encounter (HOSPITAL_COMMUNITY)
Admission: RE | Admit: 2014-11-02 | Discharge: 2014-11-02 | Disposition: A | Discharge status: Home / Self Care | Payer: Commercial Managed Care - HMO | Source: Ambulatory Visit | Attending: Cardiovascular Disease | Admitting: Cardiovascular Disease

## 2014-11-02 DIAGNOSIS — Z955 Presence of coronary angioplasty implant and graft: Secondary | ICD-10-CM | POA: Diagnosis not present

## 2014-11-04 ENCOUNTER — Ambulatory Visit (INDEPENDENT_AMBULATORY_CARE_PROVIDER_SITE_OTHER): Payer: Commercial Managed Care - HMO | Admitting: Cardiology

## 2014-11-04 ENCOUNTER — Encounter (HOSPITAL_COMMUNITY)
Admission: RE | Admit: 2014-11-04 | Discharge: 2014-11-04 | Disposition: A | Discharge status: Home / Self Care | Payer: Commercial Managed Care - HMO | Source: Ambulatory Visit | Attending: Cardiovascular Disease | Admitting: Cardiovascular Disease

## 2014-11-04 ENCOUNTER — Encounter (HOSPITAL_COMMUNITY): Payer: Commercial Managed Care - HMO

## 2014-11-04 ENCOUNTER — Encounter: Payer: Self-pay | Admitting: Cardiology

## 2014-11-04 VITALS — BP 110/62 | HR 96 | Ht 68.0 in | Wt 212.5 lb

## 2014-11-04 DIAGNOSIS — I10 Essential (primary) hypertension: Secondary | ICD-10-CM | POA: Diagnosis not present

## 2014-11-04 DIAGNOSIS — E785 Hyperlipidemia, unspecified: Secondary | ICD-10-CM

## 2014-11-04 DIAGNOSIS — R55 Syncope and collapse: Secondary | ICD-10-CM

## 2014-11-04 DIAGNOSIS — Z955 Presence of coronary angioplasty implant and graft: Secondary | ICD-10-CM | POA: Diagnosis not present

## 2014-11-04 DIAGNOSIS — I25119 Atherosclerotic heart disease of native coronary artery with unspecified angina pectoris: Secondary | ICD-10-CM

## 2014-11-04 DIAGNOSIS — I739 Peripheral vascular disease, unspecified: Secondary | ICD-10-CM

## 2014-11-04 DIAGNOSIS — I779 Disorder of arteries and arterioles, unspecified: Secondary | ICD-10-CM

## 2014-11-04 NOTE — Patient Instructions (Signed)
Your physician recommends that you schedule a follow-up appointment Keep your appointment with Dr Gwenlyn Found on 11/24/2014 @ 4:30  Once finish with cardiac rehab you can go back to normal life.

## 2014-11-04 NOTE — Progress Notes (Signed)
Cardiology Office Note   Date:  11/04/2014   ID:  Reginald Little, DOB [birth year], MRN [id number]  PCP:  Mathews Argyle, MD  Cardiologist:  Dr. Gwenlyn Found    Chief Complaint  Patient presents with  . Hospitalization Follow-up    pt was in cardiac rehab when he experience some dizzinessand sweating, then he pass out      History of Present Illness: Reginald Little is a 64 y.o. male who presents for eval after hospitalization for syncope.  Enzymes were negative.  His imdur was decreased from 60 to 30 mg.  He has done well since then.  No chest pain and no SOB.  He has decreased his protonix to 20 mg which is fine.  He stopped his COQ10.  Continues with ASA and Plavix and other meds.  He is to see Dr. Adora Fridge on June 14th.  His past medical history of CAD s/p DES x 2 to the RCA (December 2013 at [facility]) and DES of OM and PLA branches (December 2015), prior tobacco use, HTN and HLD.  Last cath  09/2014 with patent stents. Mild 40% stenosis in LAD.  EF 65%.    We discussed diet and exercise and importance of each. Due to hip pain he has trouble with exercise, he may join gym to use a pool.  He is worried he may not be able to physical activity as before, but I reassured. He has normal EF and patent coronary arteries.  He is in cardiac rehab and encouraged to continue.     Past Medical History  Diagnosis Date  . Hypertension   . Obesity   . HLD (hyperlipidemia)   . Coronary artery disease     a. s/p DES x 2 to the RCA (05/2012 at [facility]) b. s/p DES of OM and PLA branches (05/2014)  c. s/p LHC on 10/06/14 with patent stents and non-obs CAD   . GERD (gastroesophageal reflux disease)   . H/O carotid endarterectomy   . Complication of anesthesia     " i had complications with it in 1991"  . Syncope     a. 10/2014 - felt to be 2/2 hypotension after recent imdur titration.    Past Surgical History  Procedure Laterality Date  . Coronary angioplasty with stent placement    . Carotid  endarterectomy    . Left heart catheterization with coronary angiogram N/A 06/08/2014    Procedure: LEFT HEART CATHETERIZATION WITH CORONARY ANGIOGRAM;  Surgeon: Blane Ohara, MD;  Location: [facility] CATH LAB;  Service: Cardiovascular;  Laterality: N/A;  . Left heart catheterization with coronary angiogram N/A 10/06/2014    Procedure: LEFT HEART CATHETERIZATION WITH CORONARY ANGIOGRAM;  Surgeon: Leonie Man, MD;  Location[facility] CATH LAB;  Service: Cardiovascular;  Laterality: N/A;     Current Outpatient Prescriptions  Medication Sig Dispense Refill  . aspirin EC 81 MG tablet Take 81 mg by mouth daily.    Marland Kitchen atenolol (TENORMIN) 50 MG tablet Take 1 tablet (50 mg total) by mouth daily. 90 tablet 3  . CALCIUM PO Take 300 mg by mouth daily.    . Cholecalciferol (VITAMIN D3) 1000 UNITS CAPS Take 2,000 Units by mouth daily.     . clopidogrel (PLAVIX) 75 MG tablet Take 75 mg by mouth daily.    . isosorbide mononitrate (IMDUR) 30 MG 24 hr tablet Take 1 tablet by mouth daily.    Marland Kitchen losartan (COZAAR) 100 MG tablet Take 0.5 tablets (50 mg total) by mouth  daily. 90 tablet 3  . Multiple Vitamins-Minerals (CENTRUM SILVER PO) Take 1 tablet by mouth daily.    . nitroGLYCERIN (NITROSTAT) 0.4 MG SL tablet Place 1 tablet (0.4 mg total) under the tongue every 5 (five) minutes x 3 doses as needed for chest pain. 25 tablet 12  . Omega-3 Fatty Acids (OMEGA-3 PO) Take 1,000 mg by mouth daily.     . pantoprazole (PROTONIX) 40 MG tablet Take 1 tablet (40 mg total) by mouth daily at 6 (six) AM. (Patient taking differently: Take 20 mg by mouth daily at 6 (six) AM. ) 30 tablet 11  . rosuvastatin (CRESTOR) 20 MG tablet Take 1 tablet (20 mg total) by mouth daily. 30 tablet 11   No current facility-administered medications for this visit.    Allergies:   Penicillins    Social History:  The patient  reports that he quit smoking about 2 years ago. He has never used smokeless tobacco. He reports that he does not drink alcohol  or use illicit drugs.   Family History:  The patient's family history includes COPD (age of onset: 21) in his mother; Heart attack (age of onset: 58) in his father.    ROS:  General:no colds or fevers, + weight gain Skin:no rashes or ulcers HEENT:no blurred vision, no congestion CV:see HPI PUL:see HPI GI:no diarrhea constipation or melena, no indigestion GU:no hematuria, no dysuria MS:no joint pain, no claudication Neuro:no syncope, no lightheadedness Endo:no diabetes, no thyroid disease  Wt Readings from Last 3 Encounters:  11/04/14 212 lb 8 oz (96.389 kg)  10/21/14 203 lb (92.08 kg)  10/06/14 206 lb 12.7 oz (93.8 kg)     PHYSICAL EXAM: VS:  BP 110/62 mmHg  Pulse 96  Ht 5\' 8"  (1.727 m)  Wt 212 lb 8 oz (96.389 kg)  BMI 32.32 kg/m2 , BMI Body mass index is 32.32 kg/(m^2). General:Pleasant affect, NAD Skin:Warm and dry, brisk capillary refill HEENT:normocephalic, sclera clear, mucus membranes moist Neck:supple, no JVD, no bruits  Heart:S1S2 RRR without murmur, gallup, rub or click Lungs:clear without rales, rhonchi, or wheezes EUM:PNTI, non tender, + BS, do not palpate liver spleen or masses Ext:no lower ext edema, 2+ pedal pulses, 2+ radial pulses Neuro:alert and oriented X 3, MAE, follows commands, + facial symmetry    EKG:  EKG is NOT ordered today.    Recent Labs: 10/05/2014: B Natriuretic Peptide 53.0; TSH 1.219 10/06/2014: ALT 35 10/21/2014: BUN 23*; Creatinine 1.14; Hemoglobin 13.1; Platelets 235; Potassium 4.4; Sodium 140    Lipid Panel    Component Value Date/Time   CHOL 128 10/06/2014 0505   TRIG 96 10/06/2014 0505   HDL 29* 10/06/2014 0505   CHOLHDL 4.4 10/06/2014 0505   VLDL 19 10/06/2014 0505   [place] 80 10/06/2014 0505       Other studies Reviewed: Additional studies/ records that were reviewed today include:cath reports, discharge summaries.   ASSESSMENT AND PLAN:  Stented coronary arteries- CAD History of CAD status post remote  stenting of his coronary arteries at [facility] 2 years ago with admission for chest pain and successful stenting of an obtuse marginal branch and PLA using Promus Premier drug-eluting stents by Dr. Burt Knack. He was complaining of some chest pain and was begun on long-acting nitrate by Tenny Craw which resulted in some improvement.  In April with chest pain had cardiac cath and stents were patent.  He has 40% LAD disease.  His imdur was increased to 60 mg.  He then had syncope in Cardiac  rehab though to be due to hypotension.  Kept in obs overnight with neg. Troponin.  Imdur now back to 30 mg.  No chest pain pt to see Dr. Adora Fridge in June. He was encouraged to complete rehab and then he would be safe to live his life.  He plans a cruise in the fall.   Essential hypertension History of hypertension blood pressure measured at 110/62. He is on atenolol, and losartan. Continue current meds at current dosing   Hyperlipidemia History of hyperlipidemia on Crestor 10 mg a day. Lipids done in April with LDL of 80 HDL of 29   Carotid artery disease History of left carotid endarterectomy at the [facility] in [place] remotely with TIAs prior to that. Does have a left carotid bruit.  Mildly abnl.carotid Doppler studies in March.   Claudication History of left hip claudication. Lower extremity arterial Doppler studies, bilateral ant. Tibial arteries occluded.    Current medicines are reviewed with the patient today.  The patient Has no concerns regarding medicines.  The following changes have been made:  See above Labs/ tests ordered today include:see above  Disposition:   FU:  see above  Signed, Reginald Serge, NP  11/04/2014 2:01 PM    [facility] [place], [place], [address] [place], [place] Phone: [phone]; Fax: [phone]

## 2014-11-06 ENCOUNTER — Encounter (HOSPITAL_COMMUNITY)
Admission: RE | Admit: 2014-11-06 | Discharge: 2014-11-06 | Disposition: A | Discharge status: Home / Self Care | Payer: Commercial Managed Care - HMO | Source: Ambulatory Visit | Attending: Cardiovascular Disease | Admitting: Cardiovascular Disease

## 2014-11-06 ENCOUNTER — Encounter (HOSPITAL_COMMUNITY): Payer: Commercial Managed Care - HMO

## 2014-11-06 DIAGNOSIS — Z955 Presence of coronary angioplasty implant and graft: Secondary | ICD-10-CM | POA: Diagnosis not present

## 2014-11-09 ENCOUNTER — Encounter (HOSPITAL_COMMUNITY): Payer: Commercial Managed Care - HMO

## 2014-11-11 ENCOUNTER — Encounter (HOSPITAL_COMMUNITY): Payer: Commercial Managed Care - HMO

## 2014-11-11 ENCOUNTER — Encounter (HOSPITAL_COMMUNITY)
Admission: RE | Admit: 2014-11-11 | Discharge: 2014-11-11 | Disposition: A | Discharge status: Home / Self Care | Payer: Commercial Managed Care - HMO | Source: Ambulatory Visit | Attending: Cardiovascular Disease | Admitting: Cardiovascular Disease

## 2014-11-11 DIAGNOSIS — Z955 Presence of coronary angioplasty implant and graft: Secondary | ICD-10-CM | POA: Diagnosis not present

## 2014-11-13 ENCOUNTER — Encounter (HOSPITAL_COMMUNITY): Payer: Commercial Managed Care - HMO

## 2014-11-13 ENCOUNTER — Encounter (HOSPITAL_COMMUNITY)
Admission: RE | Admit: 2014-11-13 | Discharge: 2014-11-13 | Disposition: A | Discharge status: Home / Self Care | Payer: Commercial Managed Care - HMO | Source: Ambulatory Visit | Attending: Cardiovascular Disease | Admitting: Cardiovascular Disease

## 2014-11-13 DIAGNOSIS — Z955 Presence of coronary angioplasty implant and graft: Secondary | ICD-10-CM | POA: Diagnosis not present

## 2014-11-16 ENCOUNTER — Encounter (HOSPITAL_COMMUNITY)
Admission: RE | Admit: 2014-11-16 | Discharge: 2014-11-16 | Disposition: A | Discharge status: Home / Self Care | Payer: Commercial Managed Care - HMO | Source: Ambulatory Visit | Attending: Cardiovascular Disease | Admitting: Cardiovascular Disease

## 2014-11-16 ENCOUNTER — Encounter (HOSPITAL_COMMUNITY): Payer: Commercial Managed Care - HMO

## 2014-11-16 DIAGNOSIS — Z955 Presence of coronary angioplasty implant and graft: Secondary | ICD-10-CM | POA: Diagnosis not present

## 2014-11-18 ENCOUNTER — Encounter (HOSPITAL_COMMUNITY): Payer: Commercial Managed Care - HMO

## 2014-11-18 ENCOUNTER — Encounter (HOSPITAL_COMMUNITY)
Admission: RE | Admit: 2014-11-18 | Discharge: 2014-11-18 | Disposition: A | Discharge status: Home / Self Care | Payer: Commercial Managed Care - HMO | Source: Ambulatory Visit | Attending: Cardiovascular Disease | Admitting: Cardiovascular Disease

## 2014-11-18 DIAGNOSIS — Z955 Presence of coronary angioplasty implant and graft: Secondary | ICD-10-CM | POA: Diagnosis not present

## 2014-11-20 ENCOUNTER — Encounter (HOSPITAL_COMMUNITY): Payer: Commercial Managed Care - HMO

## 2014-11-20 ENCOUNTER — Encounter (HOSPITAL_COMMUNITY)
Admission: RE | Admit: 2014-11-20 | Discharge: 2014-11-20 | Disposition: A | Discharge status: Home / Self Care | Payer: Commercial Managed Care - HMO | Source: Ambulatory Visit | Attending: Cardiovascular Disease | Admitting: Cardiovascular Disease

## 2014-11-20 DIAGNOSIS — Z955 Presence of coronary angioplasty implant and graft: Secondary | ICD-10-CM | POA: Diagnosis not present

## 2014-11-23 ENCOUNTER — Encounter (HOSPITAL_COMMUNITY): Payer: Commercial Managed Care - HMO

## 2014-11-23 ENCOUNTER — Encounter (HOSPITAL_COMMUNITY)
Admission: RE | Admit: 2014-11-23 | Discharge: 2014-11-23 | Disposition: A | Discharge status: Home / Self Care | Payer: Commercial Managed Care - HMO | Source: Ambulatory Visit | Attending: Cardiovascular Disease | Admitting: Cardiovascular Disease

## 2014-11-23 DIAGNOSIS — Z955 Presence of coronary angioplasty implant and graft: Secondary | ICD-10-CM | POA: Diagnosis not present

## 2014-11-24 ENCOUNTER — Encounter: Payer: Self-pay | Admitting: Cardiovascular Disease

## 2014-11-24 ENCOUNTER — Ambulatory Visit (INDEPENDENT_AMBULATORY_CARE_PROVIDER_SITE_OTHER): Payer: Commercial Managed Care - HMO | Admitting: Cardiovascular Disease

## 2014-11-24 VITALS — BP 140/72 | HR 84 | Ht 68.0 in | Wt 212.0 lb

## 2014-11-24 DIAGNOSIS — I2510 Atherosclerotic heart disease of native coronary artery without angina pectoris: Secondary | ICD-10-CM

## 2014-11-24 DIAGNOSIS — I10 Essential (primary) hypertension: Secondary | ICD-10-CM

## 2014-11-24 DIAGNOSIS — R55 Syncope and collapse: Secondary | ICD-10-CM | POA: Diagnosis not present

## 2014-11-24 DIAGNOSIS — E785 Hyperlipidemia, unspecified: Secondary | ICD-10-CM | POA: Diagnosis not present

## 2014-11-24 DIAGNOSIS — I739 Peripheral vascular disease, unspecified: Secondary | ICD-10-CM

## 2014-11-24 DIAGNOSIS — M25552 Pain in left hip: Secondary | ICD-10-CM | POA: Diagnosis not present

## 2014-11-24 DIAGNOSIS — I2583 Coronary atherosclerosis due to lipid rich plaque: Secondary | ICD-10-CM

## 2014-11-24 DIAGNOSIS — I779 Disorder of arteries and arterioles, unspecified: Secondary | ICD-10-CM

## 2014-11-24 NOTE — Progress Notes (Signed)
11/24/2014 Reginald Little   November 24, 1950  [id number]  Primary Physician Mathews Argyle, MD Primary Cardiologist: Lorretta Harp MD Renae Gloss   HPI:   Reginald Little is a 64 year old mildly overweight divorced Caucasian male with history of CAD status post stenting of his coronary arteries 2 years ago at [facility] using drug-eluting stents. A left sludge in the office 08/18/14.His history is also remarkable for remote tobacco abuse, treated hypertension and hyperlipidemia. He had a left carotid endarterectomy performed at the [facility] in [place] number of years ago as well. He was admitted to the hospital in December with chest pain. A Myoview stress test was high risk and he underwent cardiac catheterization by Dr. Burt Knack stenting to coronary arteries using drug-eluting stents. He had some chest pain since which is improved with the addition of long-acting oral nitrate. He does complain of left hip claudication as well. He was admitted to the hospital for observation overnight 10/21/14 because of witnessed syncope while chronic rehabilitation. His nitrates were decreased in dose. He did have lower extremity artery Doppler studies which were normal ruling out peripheral vascular disease as a cause of his left hip pain   Current Outpatient Prescriptions  Medication Sig Dispense Refill  . aspirin EC 81 MG tablet Take 81 mg by mouth daily.    Marland Kitchen atenolol (TENORMIN) 50 MG tablet Take 1 tablet (50 mg total) by mouth daily. 90 tablet 3  . CALCIUM PO Take 300 mg by mouth daily.    . Cholecalciferol (VITAMIN D3) 1000 UNITS CAPS Take 2,000 Units by mouth daily.     . clopidogrel (PLAVIX) 75 MG tablet Take 75 mg by mouth daily.    . isosorbide mononitrate (IMDUR) 30 MG 24 hr tablet Take 1 tablet by mouth daily.    Marland Kitchen losartan (COZAAR) 100 MG tablet Take 0.5 tablets (50 mg total) by mouth daily. 90 tablet 3  . Multiple Vitamins-Minerals (CENTRUM SILVER PO) Take 1  tablet by mouth daily.    . nitroGLYCERIN (NITROSTAT) 0.4 MG SL tablet Place 1 tablet (0.4 mg total) under the tongue every 5 (five) minutes x 3 doses as needed for chest pain. 25 tablet 12  . Omega-3 Fatty Acids (OMEGA-3 PO) Take 1,000 mg by mouth daily.     . pantoprazole (PROTONIX) 40 MG tablet Take 1 tablet (40 mg total) by mouth daily at 6 (six) AM. (Patient taking differently: Take 20 mg by mouth daily at 6 (six) AM. ) 30 tablet 11  . rosuvastatin (CRESTOR) 20 MG tablet Take 1 tablet (20 mg total) by mouth daily. 30 tablet 11   No current facility-administered medications for this visit.    Allergies  Allergen Reactions  . Penicillins Rash    History   Social History  . Marital Status: Divorced    Spouse Name: N/A  . Number of Children: N/A  . 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 Smoking status: Former Smoker    Quit date: 06/05/2012  . Smokeless tobacco: Never Used  . Alcohol Use: No  . Drug Use: No  . Sexual Activity: Not on file   Other Topics Concern  . Not on file   Social History Narrative   Lives alone.     Review of Systems: General: negative for chills, fever, night sweats or weight changes.  Cardiovascular: negative for chest pain, dyspnea on exertion, edema,  orthopnea, palpitations, paroxysmal nocturnal dyspnea or shortness of breath Dermatological: negative for rash Respiratory: negative for cough or wheezing Urologic: negative for hematuria Abdominal: negative for nausea, vomiting, diarrhea, bright red blood per rectum, melena, or hematemesis Neurologic: negative for visual changes, syncope, or dizziness All other systems reviewed and are otherwise negative except as noted above.    Blood pressure 140/72, pulse 84, height 5\' 8"  (1.727 m), weight 212 lb (96.163 kg).  General appearance: alert and no distress Neck: no adenopathy, no JVD, supple, symmetrical,  trachea midline, thyroid not enlarged, symmetric, no tenderness/mass/nodules and left carotid bruit Lungs: clear to auscultation bilaterally Heart: regular rate and rhythm, S1, S2 normal, no murmur, click, rub or gallop Extremities: extremities normal, atraumatic, no cyanosis or edema  EKG not performed today  ASSESSMENT AND PLAN:   Syncope Reginald Little experienced syncope while on chronic rehabilitation. He is admitted overnight. His Imdur was decreased. The thought was that he was overmedicated.  Left hip pain Recent lower extremity arterial Doppler studies were entirely normal ruling out a vascular etiology  Hyperlipidemia History of hyperlipidemia on rosuvastatin 20 mg. His recent lipid profile performed 10/06/14 revealed total cholesterol 128, LDL 80 HDL of 29  Essential hypertension History of hypertension blood pressure measures 140/72. He is on losartan and atenolol. Continue current meds at current dose  Coronary artery disease History of CAD status post stenting of the PLA branch and obtuse marginal branch by Dr. Burt Knack in December of last year with drug-eluting stents.  Carotid artery disease History of carotid artery disease status post remote left carotid endarterectomy at the [facility] in [place] number of years ago. Recent Dopplers were entirely normal. Does have a left carotid bruit      Lorretta Harp MD [facility], [facility] 11/24/2014 5:09 PM

## 2014-11-24 NOTE — Assessment & Plan Note (Signed)
Mr. Farrelly experienced syncope while on chronic rehabilitation. He is admitted overnight. His Imdur was decreased. The thought was that he was overmedicated.

## 2014-11-24 NOTE — Assessment & Plan Note (Signed)
History of CAD status post stenting of the PLA branch and obtuse marginal branch by Dr. Burt Knack in December of last year with drug-eluting stents.

## 2014-11-24 NOTE — Assessment & Plan Note (Signed)
History of hyperlipidemia on rosuvastatin 20 mg. His recent lipid profile performed 10/06/14 revealed total cholesterol 128, LDL 80 HDL of 29

## 2014-11-24 NOTE — Assessment & Plan Note (Signed)
Recent lower extremity arterial Doppler studies were entirely normal ruling out a vascular etiology

## 2014-11-24 NOTE — Assessment & Plan Note (Signed)
History of carotid artery disease status post remote left carotid endarterectomy at the [facility] in [place] number of years ago. Recent Dopplers were entirely normal. Does have a left carotid bruit

## 2014-11-24 NOTE — Patient Instructions (Signed)
Your physician wants you to follow-up in: 6 months with Dr Berry. You will receive a reminder letter in the mail two months in advance. If you don't receive a letter, please call our office to schedule the follow-up appointment.  

## 2014-11-24 NOTE — Assessment & Plan Note (Signed)
History of hypertension blood pressure measures 140/72. He is on losartan and atenolol. Continue current meds at current dose

## 2014-11-25 ENCOUNTER — Encounter (HOSPITAL_COMMUNITY)
Admission: RE | Admit: 2014-11-25 | Discharge: 2014-11-25 | Disposition: A | Discharge status: Home / Self Care | Payer: Commercial Managed Care - HMO | Source: Ambulatory Visit | Attending: Cardiovascular Disease | Admitting: Cardiovascular Disease

## 2014-11-25 ENCOUNTER — Encounter (HOSPITAL_COMMUNITY): Payer: Commercial Managed Care - HMO

## 2014-11-25 DIAGNOSIS — Z955 Presence of coronary angioplasty implant and graft: Secondary | ICD-10-CM | POA: Diagnosis not present

## 2014-11-26 NOTE — Progress Notes (Signed)
  30 day Psychosocial followup assessment  Patient psychosocial assessment reveals no barriers to cardiac rehab participation.  Psychosocial areas that are affecting patient's rehab experience include concerns about overall health and emotional issues as evidenced by tearful at times.  Pt observed interacting more with fellow participants engaging in topics of interest.  Patient  Does continue to exhibit  healthier coping skills to deal with psychosocial concerns. Pt is concerned regarding his weight gain.  Pt not as active due to leg discomfort.  Pt seen by Dr. Gwenlyn Found this week and it is felt that the discomfort may be arthritic in nature and not circulatory/heart.  Plan is for pt to follow up with primary MD for management   Patient does feel he ismaking progress towards cardiac rehab goals.  Pt is contemplating attending the maintenance program after he completes phase II.  Patient reports health and activity level have remained constant in the past 30 days.**Patient reports feeling positive about current and projected progress toward cardiac rehab goals particular after finding out that his leg discomfort was not related his heart.  Patient's rate of progress towards achieving these goals is good.  Pt is hopeful that after he sees the primary MD he will be in a better position to have success. Plan of action to help patient continue to work towards rehab goals include close monitoring and modify equipment choices as needed.  Will continue to monitor and evaluate progress toward psychosocial goals.  Goals in progress: improved management of his overall health. improved coping skills with stressors in his life. Hellp patient work toward returning to meaningful activities that improve patient's quality of life and are attainable. Cherre Huger, BSN

## 2014-11-27 ENCOUNTER — Encounter (HOSPITAL_COMMUNITY): Payer: Commercial Managed Care - HMO

## 2014-11-27 ENCOUNTER — Encounter (HOSPITAL_COMMUNITY): Admission: RE | Admit: 2014-11-27 | Payer: Commercial Managed Care - HMO | Source: Ambulatory Visit

## 2014-11-30 ENCOUNTER — Encounter (HOSPITAL_COMMUNITY): Payer: Commercial Managed Care - HMO

## 2014-11-30 ENCOUNTER — Encounter (HOSPITAL_COMMUNITY)
Admission: RE | Admit: 2014-11-30 | Discharge: 2014-11-30 | Disposition: A | Discharge status: Home / Self Care | Payer: Commercial Managed Care - HMO | Source: Ambulatory Visit | Attending: Cardiovascular Disease | Admitting: Cardiovascular Disease

## 2014-11-30 DIAGNOSIS — Z955 Presence of coronary angioplasty implant and graft: Secondary | ICD-10-CM | POA: Diagnosis not present

## 2014-12-02 ENCOUNTER — Encounter (HOSPITAL_COMMUNITY): Payer: Commercial Managed Care - HMO

## 2014-12-02 ENCOUNTER — Encounter (HOSPITAL_COMMUNITY)
Admission: RE | Admit: 2014-12-02 | Discharge: 2014-12-02 | Disposition: A | Discharge status: Home / Self Care | Payer: Commercial Managed Care - HMO | Source: Ambulatory Visit | Attending: Cardiovascular Disease | Admitting: Cardiovascular Disease

## 2014-12-02 DIAGNOSIS — Z955 Presence of coronary angioplasty implant and graft: Secondary | ICD-10-CM | POA: Diagnosis not present

## 2014-12-04 ENCOUNTER — Encounter (HOSPITAL_COMMUNITY): Payer: Commercial Managed Care - HMO

## 2014-12-04 ENCOUNTER — Encounter (HOSPITAL_COMMUNITY)
Admission: RE | Admit: 2014-12-04 | Discharge: 2014-12-04 | Disposition: A | Discharge status: Home / Self Care | Payer: Commercial Managed Care - HMO | Source: Ambulatory Visit | Attending: Cardiovascular Disease | Admitting: Cardiovascular Disease

## 2014-12-04 DIAGNOSIS — Z955 Presence of coronary angioplasty implant and graft: Secondary | ICD-10-CM | POA: Diagnosis not present

## 2014-12-07 ENCOUNTER — Encounter (HOSPITAL_COMMUNITY): Payer: Commercial Managed Care - HMO

## 2014-12-07 ENCOUNTER — Encounter (HOSPITAL_COMMUNITY)
Admission: RE | Admit: 2014-12-07 | Discharge: 2014-12-07 | Disposition: A | Discharge status: Home / Self Care | Payer: Commercial Managed Care - HMO | Source: Ambulatory Visit | Attending: Cardiovascular Disease | Admitting: Cardiovascular Disease

## 2014-12-07 DIAGNOSIS — L989 Disorder of the skin and subcutaneous tissue, unspecified: Secondary | ICD-10-CM | POA: Diagnosis not present

## 2014-12-07 DIAGNOSIS — Z955 Presence of coronary angioplasty implant and graft: Secondary | ICD-10-CM | POA: Diagnosis not present

## 2014-12-07 DIAGNOSIS — R55 Syncope and collapse: Secondary | ICD-10-CM | POA: Diagnosis not present

## 2014-12-09 ENCOUNTER — Encounter (HOSPITAL_COMMUNITY): Payer: Commercial Managed Care - HMO

## 2014-12-09 ENCOUNTER — Encounter (HOSPITAL_COMMUNITY)
Admission: RE | Admit: 2014-12-09 | Discharge: 2014-12-09 | Disposition: A | Discharge status: Home / Self Care | Payer: Commercial Managed Care - HMO | Source: Ambulatory Visit | Attending: Cardiovascular Disease | Admitting: Cardiovascular Disease

## 2014-12-09 DIAGNOSIS — Z955 Presence of coronary angioplasty implant and graft: Secondary | ICD-10-CM | POA: Diagnosis not present

## 2014-12-09 LAB — GLUCOSE, CAPILLARY: GLUCOSE-CAPILLARY: 166 mg/dL — AB (ref 65–99)

## 2014-12-09 NOTE — Progress Notes (Signed)
Reginald Little reports that he felt lightheaded today at home while working on the computer at home this morning. Mr Granier said he checked his blood pressure at home it was 120/70. Blood pressure this morning 133/74 sitting heart rate 83. Standing blood pressure 136/70 heart rate 88. Patient reported feeling a little lightheaded yesterday and on Sunday. Telemetry rhythm Sinus. Patient denies feeling lightheaded today at cardiac rehab. Telemetry rhythm Sinus 72.  Oxygen saturation 98% on room air. Luisa Dago [facility] notified of the patient symptoms and today's vital signs. No new orders received. I also checked Mr Hoskinson's blood sugar it was 166. Luisa Dago [facility] told Mr Kostelnik is okay to proceed with exercise. Mr Fleagle will not exercise today but plans to return to exercise on Friday. Will fax exercise flow sheets to Dr. Kennon Holter office for review.

## 2014-12-11 ENCOUNTER — Encounter (HOSPITAL_COMMUNITY)
Admission: RE | Admit: 2014-12-11 | Discharge: 2014-12-11 | Disposition: A | Discharge status: Home / Self Care | Payer: Commercial Managed Care - HMO | Source: Ambulatory Visit | Attending: Cardiovascular Disease | Admitting: Cardiovascular Disease

## 2014-12-11 ENCOUNTER — Encounter (HOSPITAL_COMMUNITY): Payer: Commercial Managed Care - HMO

## 2014-12-11 DIAGNOSIS — Z955 Presence of coronary angioplasty implant and graft: Secondary | ICD-10-CM | POA: Insufficient documentation

## 2014-12-14 ENCOUNTER — Encounter (HOSPITAL_COMMUNITY): Payer: Commercial Managed Care - HMO

## 2014-12-15 ENCOUNTER — Other Ambulatory Visit: Payer: Self-pay | Admitting: *Deleted

## 2014-12-15 MED ORDER — CLOPIDOGREL BISULFATE 75 MG PO TABS: 75.0000 mg | ORAL_TABLET | Freq: Every day | ORAL | Status: DC

## 2014-12-16 ENCOUNTER — Encounter (HOSPITAL_COMMUNITY)
Admission: RE | Admit: 2014-12-16 | Discharge: 2014-12-16 | Disposition: A | Discharge status: Home / Self Care | Payer: Commercial Managed Care - HMO | Source: Ambulatory Visit | Attending: Cardiovascular Disease | Admitting: Cardiovascular Disease

## 2014-12-16 ENCOUNTER — Encounter (HOSPITAL_COMMUNITY): Payer: Commercial Managed Care - HMO

## 2014-12-16 DIAGNOSIS — Z955 Presence of coronary angioplasty implant and graft: Secondary | ICD-10-CM | POA: Diagnosis not present

## 2014-12-18 ENCOUNTER — Encounter (HOSPITAL_COMMUNITY): Payer: Commercial Managed Care - HMO

## 2014-12-18 ENCOUNTER — Encounter (HOSPITAL_COMMUNITY)
Admission: RE | Admit: 2014-12-18 | Discharge: 2014-12-18 | Disposition: A | Discharge status: Home / Self Care | Payer: Commercial Managed Care - HMO | Source: Ambulatory Visit | Attending: Cardiovascular Disease | Admitting: Cardiovascular Disease

## 2014-12-18 DIAGNOSIS — Z955 Presence of coronary angioplasty implant and graft: Secondary | ICD-10-CM | POA: Diagnosis not present

## 2014-12-21 ENCOUNTER — Encounter (HOSPITAL_COMMUNITY)
Admission: RE | Admit: 2014-12-21 | Discharge: 2014-12-21 | Disposition: A | Discharge status: Home / Self Care | Payer: Commercial Managed Care - HMO | Source: Ambulatory Visit | Attending: Cardiovascular Disease | Admitting: Cardiovascular Disease

## 2014-12-21 DIAGNOSIS — Z955 Presence of coronary angioplasty implant and graft: Secondary | ICD-10-CM | POA: Diagnosis not present

## 2014-12-23 ENCOUNTER — Encounter (HOSPITAL_COMMUNITY)
Admission: RE | Admit: 2014-12-23 | Discharge: 2014-12-23 | Disposition: A | Discharge status: Home / Self Care | Payer: Commercial Managed Care - HMO | Source: Ambulatory Visit | Attending: Cardiovascular Disease | Admitting: Cardiovascular Disease

## 2014-12-23 DIAGNOSIS — Z955 Presence of coronary angioplasty implant and graft: Secondary | ICD-10-CM | POA: Diagnosis not present

## 2014-12-23 NOTE — Progress Notes (Signed)
Pt will graduate with the completion of 36 exercise sessions Phase II on 7/15. Pt maintained good attendance with rehospitalization due to syncope.  Pt  progressed fair during his participation in rehab as evidenced by increased MET level from 3.2 to 3.5.   Medication list reconciled. Repeat  PHQ score- 1 .  Pt readily admits that he has days where he feels "blue" but these are seldom in nature. Pt feels everyone goes through that and deals with life. He is no different  Pt enjoys walking in the neighborhood although he does not consistently walk on his off days from rehab. Encouraged pt that in order to make the desired progress, he must exercise on those days he does not attend cardiac rehab. Pt plans to participate in cardiac rehab maintenance program here at [facility]. I believe this will be a great benefit to him due to the social nature of the program.  Participating in a group exercise will provided the accountability he needs in order to participate with consistency Pt feels he is making progress toward meeting  his goals during cardiac rehab.  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  Pt realizes that in order to be successful in playing again his overall health will need to improve. Pt has not lost the desired weight of 25 pounds.  Pt has maintained his weight with very little fluctuation or change.  Pt attributes this to his inability to exercise due to leg and hip pain.  Pt is in the process of exploring other options that will improve the pain in his hip and leg. Pt provided good humor and support to fellow participants.  It was indeed a pleasure to work with this pt and we are looking forward to working with him the maintenance program. Maurice Small RN, BSN

## 2014-12-25 ENCOUNTER — Encounter (HOSPITAL_COMMUNITY)
Admission: RE | Admit: 2014-12-25 | Discharge: 2014-12-25 | Disposition: A | Discharge status: Home / Self Care | Payer: Commercial Managed Care - HMO | Source: Ambulatory Visit | Attending: Cardiovascular Disease | Admitting: Cardiovascular Disease

## 2014-12-25 DIAGNOSIS — Z955 Presence of coronary angioplasty implant and graft: Secondary | ICD-10-CM | POA: Diagnosis not present

## 2014-12-28 ENCOUNTER — Encounter (HOSPITAL_COMMUNITY): Payer: Commercial Managed Care - HMO

## 2014-12-28 ENCOUNTER — Other Ambulatory Visit: Payer: Self-pay | Admitting: *Deleted

## 2014-12-30 ENCOUNTER — Encounter (HOSPITAL_COMMUNITY): Payer: Commercial Managed Care - HMO

## 2015-01-01 ENCOUNTER — Encounter (HOSPITAL_COMMUNITY): Payer: Commercial Managed Care - HMO

## 2015-01-04 ENCOUNTER — Encounter (HOSPITAL_COMMUNITY): Payer: Commercial Managed Care - HMO

## 2015-01-06 ENCOUNTER — Encounter (HOSPITAL_COMMUNITY): Payer: Commercial Managed Care - HMO

## 2015-01-08 ENCOUNTER — Encounter (HOSPITAL_COMMUNITY): Payer: Commercial Managed Care - HMO

## 2015-01-11 ENCOUNTER — Encounter (HOSPITAL_COMMUNITY): Payer: Commercial Managed Care - HMO

## 2015-01-11 ENCOUNTER — Encounter (HOSPITAL_COMMUNITY)
Admission: RE | Admit: 2015-01-11 | Discharge: 2015-01-11 | Disposition: A | Discharge status: Home / Self Care | Payer: Self-pay | Source: Ambulatory Visit | Attending: Cardiovascular Disease | Admitting: Cardiovascular Disease

## 2015-01-11 DIAGNOSIS — Z1389 Encounter for screening for other disorder: Secondary | ICD-10-CM | POA: Diagnosis not present

## 2015-01-11 DIAGNOSIS — Z6831 Body mass index (BMI) 31.0-31.9, adult: Secondary | ICD-10-CM | POA: Diagnosis not present

## 2015-01-11 DIAGNOSIS — I10 Essential (primary) hypertension: Secondary | ICD-10-CM | POA: Diagnosis not present

## 2015-01-11 DIAGNOSIS — Z955 Presence of coronary angioplasty implant and graft: Secondary | ICD-10-CM | POA: Insufficient documentation

## 2015-01-11 DIAGNOSIS — K219 Gastro-esophageal reflux disease without esophagitis: Secondary | ICD-10-CM | POA: Diagnosis not present

## 2015-01-11 DIAGNOSIS — Z0000 Encounter for general adult medical examination without abnormal findings: Secondary | ICD-10-CM | POA: Diagnosis not present

## 2015-01-11 DIAGNOSIS — E8881 Metabolic syndrome: Secondary | ICD-10-CM | POA: Diagnosis not present

## 2015-01-11 DIAGNOSIS — Z48812 Encounter for surgical aftercare following surgery on the circulatory system: Secondary | ICD-10-CM | POA: Insufficient documentation

## 2015-01-11 DIAGNOSIS — E119 Type 2 diabetes mellitus without complications: Secondary | ICD-10-CM | POA: Diagnosis not present

## 2015-01-13 ENCOUNTER — Encounter (HOSPITAL_COMMUNITY): Payer: Commercial Managed Care - HMO

## 2015-01-13 ENCOUNTER — Encounter (HOSPITAL_COMMUNITY)
Admission: RE | Admit: 2015-01-13 | Discharge: 2015-01-13 | Disposition: A | Discharge status: Home / Self Care | Payer: Self-pay | Source: Ambulatory Visit | Attending: Cardiovascular Disease | Admitting: Cardiovascular Disease

## 2015-01-15 ENCOUNTER — Encounter (HOSPITAL_COMMUNITY)
Admission: RE | Admit: 2015-01-15 | Discharge: 2015-01-15 | Disposition: A | Discharge status: Home / Self Care | Payer: Self-pay | Source: Ambulatory Visit | Attending: Cardiovascular Disease | Admitting: Cardiovascular Disease

## 2015-01-15 ENCOUNTER — Encounter (HOSPITAL_COMMUNITY): Payer: Commercial Managed Care - HMO

## 2015-01-18 ENCOUNTER — Encounter (HOSPITAL_COMMUNITY)
Admission: RE | Admit: 2015-01-18 | Discharge: 2015-01-18 | Disposition: A | Discharge status: Home / Self Care | Payer: Self-pay | Source: Ambulatory Visit | Attending: Cardiovascular Disease | Admitting: Cardiovascular Disease

## 2015-01-20 ENCOUNTER — Encounter (HOSPITAL_COMMUNITY): Payer: Self-pay

## 2015-01-22 ENCOUNTER — Encounter (HOSPITAL_COMMUNITY)
Admission: RE | Admit: 2015-01-22 | Discharge: 2015-01-22 | Disposition: A | Discharge status: Home / Self Care | Payer: Self-pay | Source: Ambulatory Visit | Attending: Cardiovascular Disease | Admitting: Cardiovascular Disease

## 2015-01-25 ENCOUNTER — Encounter (HOSPITAL_COMMUNITY)
Admission: RE | Admit: 2015-01-25 | Discharge: 2015-01-25 | Disposition: A | Discharge status: Home / Self Care | Payer: Self-pay | Source: Ambulatory Visit | Attending: Cardiovascular Disease | Admitting: Cardiovascular Disease

## 2015-01-27 ENCOUNTER — Encounter (HOSPITAL_COMMUNITY)
Admission: RE | Admit: 2015-01-27 | Discharge: 2015-01-27 | Disposition: A | Discharge status: Home / Self Care | Payer: Self-pay | Source: Ambulatory Visit | Attending: Cardiovascular Disease | Admitting: Cardiovascular Disease

## 2015-01-29 ENCOUNTER — Encounter (HOSPITAL_COMMUNITY): Payer: Self-pay

## 2015-02-01 ENCOUNTER — Encounter (HOSPITAL_COMMUNITY): Payer: Self-pay

## 2015-02-03 ENCOUNTER — Encounter (HOSPITAL_COMMUNITY): Payer: Self-pay

## 2015-02-04 DIAGNOSIS — L218 Other seborrheic dermatitis: Secondary | ICD-10-CM | POA: Diagnosis not present

## 2015-02-04 DIAGNOSIS — L821 Other seborrheic keratosis: Secondary | ICD-10-CM | POA: Diagnosis not present

## 2015-02-04 DIAGNOSIS — D225 Melanocytic nevi of trunk: Secondary | ICD-10-CM | POA: Diagnosis not present

## 2015-02-05 ENCOUNTER — Encounter (HOSPITAL_COMMUNITY): Payer: Self-pay

## 2015-02-08 ENCOUNTER — Encounter (HOSPITAL_COMMUNITY): Payer: Self-pay

## 2015-02-10 ENCOUNTER — Encounter (HOSPITAL_COMMUNITY): Payer: Self-pay

## 2015-02-12 ENCOUNTER — Encounter (HOSPITAL_COMMUNITY): Payer: Self-pay | Attending: Cardiovascular Disease

## 2015-02-12 DIAGNOSIS — Z955 Presence of coronary angioplasty implant and graft: Secondary | ICD-10-CM | POA: Insufficient documentation

## 2015-02-12 DIAGNOSIS — Z48812 Encounter for surgical aftercare following surgery on the circulatory system: Secondary | ICD-10-CM | POA: Insufficient documentation

## 2015-02-17 ENCOUNTER — Encounter (HOSPITAL_COMMUNITY): Payer: Self-pay

## 2015-02-19 ENCOUNTER — Encounter (HOSPITAL_COMMUNITY): Payer: Self-pay

## 2015-02-22 ENCOUNTER — Encounter (HOSPITAL_COMMUNITY): Payer: Self-pay

## 2015-02-24 ENCOUNTER — Encounter (HOSPITAL_COMMUNITY): Payer: Self-pay

## 2015-02-26 ENCOUNTER — Encounter (HOSPITAL_COMMUNITY): Payer: Self-pay

## 2015-03-01 ENCOUNTER — Encounter (HOSPITAL_COMMUNITY): Payer: Self-pay

## 2015-03-03 ENCOUNTER — Encounter (HOSPITAL_COMMUNITY): Payer: Self-pay

## 2015-03-05 ENCOUNTER — Encounter (HOSPITAL_COMMUNITY): Payer: Self-pay

## 2015-03-08 ENCOUNTER — Encounter (HOSPITAL_COMMUNITY): Payer: Self-pay

## 2015-03-10 ENCOUNTER — Encounter (HOSPITAL_COMMUNITY): Payer: Self-pay

## 2015-03-12 ENCOUNTER — Encounter (HOSPITAL_COMMUNITY): Payer: Self-pay

## 2015-03-15 ENCOUNTER — Encounter (HOSPITAL_COMMUNITY): Payer: Self-pay

## 2015-03-17 ENCOUNTER — Encounter (HOSPITAL_COMMUNITY): Payer: Self-pay

## 2015-03-19 ENCOUNTER — Encounter (HOSPITAL_COMMUNITY): Payer: Self-pay

## 2015-03-22 ENCOUNTER — Encounter (HOSPITAL_COMMUNITY): Payer: Self-pay

## 2015-03-24 ENCOUNTER — Encounter (HOSPITAL_COMMUNITY): Payer: Self-pay

## 2015-03-26 ENCOUNTER — Encounter (HOSPITAL_COMMUNITY): Payer: Self-pay

## 2015-03-29 ENCOUNTER — Encounter (HOSPITAL_COMMUNITY): Payer: Self-pay

## 2015-03-31 ENCOUNTER — Encounter (HOSPITAL_COMMUNITY): Payer: Self-pay

## 2015-04-02 ENCOUNTER — Encounter (HOSPITAL_COMMUNITY): Payer: Self-pay

## 2015-04-05 ENCOUNTER — Encounter (HOSPITAL_COMMUNITY): Payer: Self-pay

## 2015-04-07 ENCOUNTER — Encounter (HOSPITAL_COMMUNITY): Payer: Self-pay

## 2015-04-09 ENCOUNTER — Encounter (HOSPITAL_COMMUNITY): Payer: Self-pay

## 2015-04-12 ENCOUNTER — Encounter (HOSPITAL_COMMUNITY): Payer: Self-pay

## 2015-05-25 ENCOUNTER — Ambulatory Visit (INDEPENDENT_AMBULATORY_CARE_PROVIDER_SITE_OTHER): Payer: Commercial Managed Care - HMO | Admitting: Cardiovascular Disease

## 2015-05-25 ENCOUNTER — Encounter: Payer: Self-pay | Admitting: Cardiovascular Disease

## 2015-05-25 VITALS — BP 136/76 | HR 78 | Ht 68.0 in | Wt 215.0 lb

## 2015-05-25 DIAGNOSIS — I2510 Atherosclerotic heart disease of native coronary artery without angina pectoris: Secondary | ICD-10-CM

## 2015-05-25 DIAGNOSIS — I739 Peripheral vascular disease, unspecified: Secondary | ICD-10-CM | POA: Diagnosis not present

## 2015-05-25 DIAGNOSIS — I10 Essential (primary) hypertension: Secondary | ICD-10-CM | POA: Diagnosis not present

## 2015-05-25 DIAGNOSIS — E785 Hyperlipidemia, unspecified: Secondary | ICD-10-CM

## 2015-05-25 DIAGNOSIS — I2583 Coronary atherosclerosis due to lipid rich plaque: Secondary | ICD-10-CM

## 2015-05-25 NOTE — Assessment & Plan Note (Signed)
History of CAD status post stenting at [facility] December 2013 with 2 DES stents to his RCA. Status post PCI and stenting of his OM branch and PLA branches were drug-eluting stents December 2015. Left heart cath performed 10/06/14 revealed patent stents without obstructive disease. He gets occasional atypical chest pain. He is on appropriate antianginal medications.

## 2015-05-25 NOTE — Assessment & Plan Note (Signed)
History of hyperlipidemia on statin therapy with lipid profile performed 10/06/14 revealing total closure 128, LDL 80 and HDL of 29

## 2015-05-25 NOTE — Assessment & Plan Note (Signed)
Symptoms of claudication with normal lower extremity arterial Doppler studies

## 2015-05-25 NOTE — Patient Instructions (Signed)

## 2015-05-25 NOTE — Assessment & Plan Note (Signed)
History of hypertension with blood pressure measured at 136/76. He is on atenolol and losartan. Continue current meds at current dosing

## 2015-05-25 NOTE — Progress Notes (Signed)
05/25/2015 Reginald Little   Nov 14, 1950  [id number]  Primary Physician Reginald Argyle, MD Primary Cardiologist: Reginald Harp MD Reginald Little   HPI:  Mr. Reginald Little is a 64 year old mildly overweight divorced Caucasian male with history of CAD status post stenting of his coronary arteries 2 years ago at [facility] using drug-eluting stents.I last saw him in the office 11/24/14.Marland KitchenHis history is also remarkable for remote tobacco abuse, treated hypertension and hyperlipidemia. He had a left carotid endarterectomy performed at the [facility] in [place] number of years ago as well. He was admitted to the hospital in December with chest pain. A Myoview stress test was high risk and he underwent cardiac catheterization by Dr. Burt Little stenting to coronary arteries using drug-eluting stents. He had some chest pain since which is improved with the addition of long-acting oral nitrate. He does complain of left hip claudication as well. He was admitted to the hospital for observation overnight 10/21/14 because of witnessed syncope while chronic rehabilitation. His nitrates were decreased in dose. He did have lower extremity artery Doppler studies which were normal ruling out peripheral vascular disease as a cause of his left hip pain. Since I saw him 6 months ago he remained clinically stable.   Current Outpatient Prescriptions  Medication Sig Dispense Refill  . aspirin EC 81 MG tablet Take 81 mg by mouth daily.    Marland Kitchen atenolol (TENORMIN) 50 MG tablet Take 1 tablet (50 mg total) by mouth daily. 90 tablet 3  . CALCIUM PO Take 300 mg by mouth daily.    . Cholecalciferol (VITAMIN D3) 1000 UNITS CAPS Take 2,000 Units by mouth daily.     . clopidogrel (PLAVIX) 75 MG tablet Take 1 tablet (75 mg total) by mouth daily. 90 tablet 3  . isosorbide mononitrate (IMDUR) 30 MG 24 hr tablet Take 1 tablet by mouth daily.    Marland Kitchen losartan (COZAAR) 100 MG tablet Take 0.5 tablets (50 mg  total) by mouth daily. 90 tablet 3  . Multiple Vitamins-Minerals (CENTRUM SILVER PO) Take 1 tablet by mouth daily.    . nitroGLYCERIN (NITROSTAT) 0.4 MG SL tablet Place 1 tablet (0.4 mg total) under the tongue every 5 (five) minutes x 3 doses as needed for chest pain. 25 tablet 12  . Omega-3 Fatty Acids (OMEGA-3 PO) Take 1,000 mg by mouth daily.     . pantoprazole (PROTONIX) 40 MG tablet Take 1 tablet (40 mg total) by mouth daily at 6 (six) AM. (Patient taking differently: Take 20 mg by mouth daily at 6 (six) AM. ) 30 tablet 11  . rosuvastatin (CRESTOR) 20 MG tablet Take 1 tablet (20 mg total) by mouth daily. 30 tablet 11   No current facility-administered medications for this visit.    Allergies  Allergen Reactions  . Penicillins Rash    Social History   Social History  . Marital Status: Divorced    Spouse Name: N/A  . Number of Children: N/A  . 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 Smoking status: Former Smoker    Quit date: 06/05/2012  . Smokeless tobacco: Never Used  . Alcohol Use: No  . Drug Use: No  . Sexual Activity: Not on file   Other Topics Concern  . Not on file   Social History Narrative   Lives alone.     Review of Systems: General: negative for chills, fever,  night sweats or weight changes.  Cardiovascular: negative for chest pain, dyspnea on exertion, edema, orthopnea, palpitations, paroxysmal nocturnal dyspnea or shortness of breath Dermatological: negative for rash Respiratory: negative for cough or wheezing Urologic: negative for hematuria Abdominal: negative for nausea, vomiting, diarrhea, bright red blood per rectum, melena, or hematemesis Neurologic: negative for visual changes, syncope, or dizziness All other systems reviewed and are otherwise negative except as noted above.    Blood pressure 136/76, pulse 78, height 5\' 8"  (1.727 m), weight 215 lb (97.523 kg).   General appearance: alert and no distress Neck: no adenopathy, no carotid bruit, no JVD, supple, symmetrical, trachea midline and thyroid not enlarged, symmetric, no tenderness/mass/nodules Lungs: clear to auscultation bilaterally Heart: regular rate and rhythm, S1, S2 normal, no murmur, click, rub or gallop Extremities: extremities normal, atraumatic, no cyanosis or edema  EKG not performed today  ASSESSMENT AND PLAN:   Hyperlipidemia History of hyperlipidemia on statin therapy with lipid profile performed 10/06/14 revealing total closure 128, LDL 80 and HDL of 29  Essential hypertension History of hypertension with blood pressure measured at 136/76. He is on atenolol and losartan. Continue current meds at current dosing  Coronary artery disease History of CAD status post stenting at [facility] December 2013 with 2 DES stents to his RCA. Status post PCI and stenting of his OM branch and PLA branches were drug-eluting stents December 2015. Left heart cath performed 10/06/14 revealed patent stents without obstructive disease. He gets occasional atypical chest pain. He is on appropriate antianginal medications.  Claudication Symptoms of claudication with normal lower extremity arterial Doppler studies      Reginald Harp MD [facility], [facility] 05/25/2015 2:12 PM

## 2015-05-26 ENCOUNTER — Encounter: Payer: Self-pay | Admitting: Physician Assistant

## 2015-05-28 ENCOUNTER — Telehealth: Payer: Self-pay | Admitting: Cardiovascular Disease

## 2015-05-28 ENCOUNTER — Other Ambulatory Visit: Payer: Self-pay | Admitting: Physician Assistant

## 2015-05-28 DIAGNOSIS — N528 Other male erectile dysfunction: Secondary | ICD-10-CM

## 2015-05-28 MED ORDER — SILDENAFIL CITRATE 25 MG PO TABS: 25.0000 mg | ORAL_TABLET | Freq: Every day | ORAL | Status: DC | PRN

## 2015-05-28 NOTE — Telephone Encounter (Signed)
Close encounter 

## 2015-06-01 ENCOUNTER — Telehealth: Payer: Self-pay

## 2015-06-01 NOTE — Telephone Encounter (Signed)
Prior auth for Viagra 25mg  sent to [facility]

## 2015-06-11 ENCOUNTER — Telehealth: Payer: Self-pay | Admitting: Cardiovascular Disease

## 2015-06-11 NOTE — Telephone Encounter (Signed)
Pt was worried as his BP this morning was elevated before he took his medications at 195/95 and he was feeling light headed and dizzy. Pt rested awhile and rechecked BP and rechecked it at 145/73.  Pt realized that he had eaten some canned soup and that was probably what was increasing his BP.  Pt stated he is feeling better. Pt educated on high salt foods to stay away from in the future. Pt verbalized understanding no questions at this time.

## 2015-06-11 NOTE — Telephone Encounter (Signed)
Pt called in stating that his BP has been elevated. This morning he took it and it was around 195/95. He said he drank some water and laid down for a little while and when he woke back up he took his pressure in his lft arm which was 145/73 and his rt: 160/85. He feels that is still too high. Please f/u with him  Thanks

## 2015-06-18 ENCOUNTER — Emergency Department (HOSPITAL_COMMUNITY): Payer: Commercial Managed Care - HMO

## 2015-06-18 ENCOUNTER — Observation Stay (HOSPITAL_COMMUNITY)
Admission: EM | Admit: 2015-06-18 | Discharge: 2015-06-20 | Disposition: A | Discharge status: Home / Self Care | Payer: Commercial Managed Care - HMO | Attending: Cardiovascular Disease | Admitting: Cardiovascular Disease

## 2015-06-18 ENCOUNTER — Encounter (HOSPITAL_COMMUNITY): Payer: Self-pay | Admitting: Family Medicine

## 2015-06-18 DIAGNOSIS — I2510 Atherosclerotic heart disease of native coronary artery without angina pectoris: Secondary | ICD-10-CM

## 2015-06-18 DIAGNOSIS — E669 Obesity, unspecified: Secondary | ICD-10-CM | POA: Insufficient documentation

## 2015-06-18 DIAGNOSIS — I10 Essential (primary) hypertension: Secondary | ICD-10-CM | POA: Diagnosis present

## 2015-06-18 DIAGNOSIS — R42 Dizziness and giddiness: Secondary | ICD-10-CM | POA: Diagnosis not present

## 2015-06-18 DIAGNOSIS — Z880 Allergy status to penicillin: Secondary | ICD-10-CM | POA: Insufficient documentation

## 2015-06-18 DIAGNOSIS — R0602 Shortness of breath: Secondary | ICD-10-CM | POA: Diagnosis not present

## 2015-06-18 DIAGNOSIS — Z87891 Personal history of nicotine dependence: Secondary | ICD-10-CM | POA: Insufficient documentation

## 2015-06-18 DIAGNOSIS — R079 Chest pain, unspecified: Secondary | ICD-10-CM | POA: Diagnosis present

## 2015-06-18 DIAGNOSIS — Z79899 Other long term (current) drug therapy: Secondary | ICD-10-CM | POA: Diagnosis not present

## 2015-06-18 DIAGNOSIS — Z7902 Long term (current) use of antithrombotics/antiplatelets: Secondary | ICD-10-CM | POA: Diagnosis not present

## 2015-06-18 DIAGNOSIS — Z9861 Coronary angioplasty status: Secondary | ICD-10-CM | POA: Diagnosis not present

## 2015-06-18 DIAGNOSIS — I25119 Atherosclerotic heart disease of native coronary artery with unspecified angina pectoris: Secondary | ICD-10-CM | POA: Diagnosis not present

## 2015-06-18 DIAGNOSIS — Z9889 Other specified postprocedural states: Secondary | ICD-10-CM | POA: Insufficient documentation

## 2015-06-18 DIAGNOSIS — I208 Other forms of angina pectoris: Secondary | ICD-10-CM | POA: Diagnosis not present

## 2015-06-18 DIAGNOSIS — I739 Peripheral vascular disease, unspecified: Secondary | ICD-10-CM

## 2015-06-18 DIAGNOSIS — I209 Angina pectoris, unspecified: Secondary | ICD-10-CM | POA: Diagnosis not present

## 2015-06-18 DIAGNOSIS — K219 Gastro-esophageal reflux disease without esophagitis: Secondary | ICD-10-CM | POA: Diagnosis not present

## 2015-06-18 DIAGNOSIS — I779 Disorder of arteries and arterioles, unspecified: Secondary | ICD-10-CM | POA: Diagnosis present

## 2015-06-18 DIAGNOSIS — R110 Nausea: Secondary | ICD-10-CM | POA: Diagnosis not present

## 2015-06-18 DIAGNOSIS — Z7982 Long term (current) use of aspirin: Secondary | ICD-10-CM | POA: Diagnosis not present

## 2015-06-18 DIAGNOSIS — R55 Syncope and collapse: Secondary | ICD-10-CM | POA: Diagnosis present

## 2015-06-18 DIAGNOSIS — E785 Hyperlipidemia, unspecified: Secondary | ICD-10-CM | POA: Diagnosis present

## 2015-06-18 HISTORY — DX: Disorder of arteries and arterioles, unspecified: I77.9

## 2015-06-18 HISTORY — DX: Peripheral vascular disease, unspecified: I73.9

## 2015-06-18 HISTORY — DX: Type 2 diabetes mellitus without complications: E11.9

## 2015-06-18 LAB — BASIC METABOLIC PANEL
Anion gap: 11 (ref 5–15)
BUN: 14 mg/dL (ref 6–20)
CALCIUM: 9.5 mg/dL (ref 8.9–10.3)
CHLORIDE: 103 mmol/L (ref 101–111)
CO2: 30 mmol/L (ref 22–32)
CREATININE: 0.97 mg/dL (ref 0.61–1.24)
GFR calc non Af Amer: >60 mL/min (ref >60)
GLUCOSE: 147 mg/dL — AB (ref 65–99)
Potassium: 4.3 mmol/L (ref 3.5–5.1)
Sodium: 144 mmol/L (ref 135–145)

## 2015-06-18 LAB — CBC
HCT: 43.7 % (ref 39.0–52.0)
Hemoglobin: 14.5 g/dL (ref 13.0–17.0)
MCH: 30.5 pg (ref 26.0–34.0)
MCHC: 33.2 g/dL (ref 30.0–36.0)
MCV: 91.8 fL (ref 78.0–100.0)
PLATELETS: 257 10*3/uL (ref 150–400)
RBC: 4.76 MIL/uL (ref 4.22–5.81)
RDW: 13.1 % (ref 11.5–15.5)
WBC: 6.4 10*3/uL (ref 4.0–10.5)

## 2015-06-18 LAB — I-STAT TROPONIN, ED: TROPONIN I, POC: 0 ng/mL (ref 0.00–0.08)

## 2015-06-18 MED ORDER — ROSUVASTATIN CALCIUM 10 MG PO TABS
20.0000 mg | ORAL_TABLET | Freq: Every evening | ORAL | Status: DC
  Administered 2015-06-19: 20 mg via ORAL
  Filled 2015-06-18: qty 2

## 2015-06-18 MED ORDER — ASPIRIN 81 MG PO CHEW
243.0000 mg | CHEWABLE_TABLET | Freq: Once | ORAL | Status: AC
Start: 2015-06-18 — End: 2015-06-18
  Administered 2015-06-18: 243 mg via ORAL
  Filled 2015-06-18: qty 3

## 2015-06-18 MED ORDER — FAMOTIDINE 20 MG PO TABS
20.0000 mg | ORAL_TABLET | Freq: Every day | ORAL | Status: DC
  Administered 2015-06-19 – 2015-06-20 (×2): 20 mg via ORAL
  Filled 2015-06-18 (×2): qty 1

## 2015-06-18 MED ORDER — ACETAMINOPHEN 325 MG PO TABS: 650.0000 mg | ORAL_TABLET | ORAL | Status: DC | PRN

## 2015-06-18 MED ORDER — CLOPIDOGREL BISULFATE 75 MG PO TABS
75.0000 mg | ORAL_TABLET | Freq: Every day | ORAL | Status: DC
  Administered 2015-06-19 – 2015-06-20 (×2): 75 mg via ORAL
  Filled 2015-06-18 (×2): qty 1

## 2015-06-18 MED ORDER — ASPIRIN EC 81 MG PO TBEC
81.0000 mg | DELAYED_RELEASE_TABLET | Freq: Every day | ORAL | Status: DC
  Administered 2015-06-19 – 2015-06-20 (×2): 81 mg via ORAL
  Filled 2015-06-18 (×2): qty 1

## 2015-06-18 MED ORDER — LOSARTAN POTASSIUM 50 MG PO TABS
50.0000 mg | ORAL_TABLET | Freq: Every day | ORAL | Status: DC
  Administered 2015-06-19 – 2015-06-20 (×2): 50 mg via ORAL
  Filled 2015-06-18 (×2): qty 1

## 2015-06-18 MED ORDER — ENOXAPARIN SODIUM 40 MG/0.4ML ~~LOC~~ SOLN
40.0000 mg | SUBCUTANEOUS | Status: DC
  Filled 2015-06-18 (×2): qty 0.4

## 2015-06-18 MED ORDER — NITROGLYCERIN 0.4 MG SL SUBL: 0.4000 mg | SUBLINGUAL_TABLET | SUBLINGUAL | Status: DC | PRN

## 2015-06-18 MED ORDER — ONDANSETRON HCL 4 MG/2ML IJ SOLN: 4.0000 mg | Freq: Four times a day (QID) | INTRAMUSCULAR | Status: DC | PRN

## 2015-06-18 MED ORDER — ISOSORBIDE MONONITRATE ER 60 MG PO TB24
60.0000 mg | ORAL_TABLET | Freq: Every day | ORAL | Status: DC
  Administered 2015-06-19 – 2015-06-20 (×2): 60 mg via ORAL
  Filled 2015-06-18 (×2): qty 1

## 2015-06-18 MED ORDER — ATENOLOL 25 MG PO TABS
50.0000 mg | ORAL_TABLET | Freq: Every day | ORAL | Status: DC
  Administered 2015-06-19 – 2015-06-20 (×2): 50 mg via ORAL
  Filled 2015-06-18 (×2): qty 2

## 2015-06-18 NOTE — ED Notes (Signed)
1st attempt to call report 

## 2015-06-18 NOTE — ED Provider Notes (Signed)
CSN: [id number]     Arrival date & time 06/18/15  1619 History   First MD Initiated Contact with Patient 06/18/15 1839     Chief Complaint  Patient presents with  . Shortness of Breath  . Chest Pain     (Consider location/radiation/quality/duration/timing/severity/associated sxs/prior Treatment) Patient is a 65 y.o. male presenting with chest pain. The history is provided by the patient.  Chest Pain Pain location:  Substernal area Pain quality: pressure   Pain radiates to:  Does not radiate Pain radiates to the back: no   Pain severity:  Moderate Onset quality:  Sudden Duration:  30 minutes Timing:  Intermittent Progression:  Resolved Chronicity:  New Relieved by:  Nitroglycerin and rest Worsened by:  Nothing tried Ineffective treatments:  None tried Associated symptoms: nausea and shortness of breath   Associated symptoms: no abdominal pain, no fever, no headache, no palpitations and not vomiting   Risk factors: coronary artery disease, high cholesterol, hypertension, male sex and prior DVT/PE     65 yo M with chest pain.  Started last night, lasted for about 30 min.  Pain resolved with imdur. Patient had multiple episodes today as well. Has a history of coronary artery disease had a recent cath with stenting about a year ago. Usually does not have angina. Pain felt like his prior cardiac pain.  Past Medical History  Diagnosis Date  . Hypertension   . Obesity   . HLD (hyperlipidemia)   . Coronary artery disease     a. s/p DES x 2 to the RCA (05/2012 at [facility]) b. s/p DES of OM and PLA branches (05/2014)  c. s/p LHC on 10/06/14 with patent stents and non-obs CAD   . GERD (gastroesophageal reflux disease)   . H/O carotid endarterectomy   . Complication of anesthesia     " i had complications with it in 1991"  . Syncope     a. 10/2014 - felt to be 2/2 hypotension after recent imdur titration.   Past Surgical History  Procedure Laterality Date  . Coronary angioplasty with  stent placement    . Carotid endarterectomy    . Left heart catheterization with coronary angiogram N/A 06/08/2014    Procedure: LEFT HEART CATHETERIZATION WITH CORONARY ANGIOGRAM;  Surgeon: Blane Ohara, MD;  Location: [facility] CATH LAB;  Service: Cardiovascular;  Laterality: N/A;  . Left heart catheterization with coronary angiogram N/A 10/06/2014    Procedure: LEFT HEART CATHETERIZATION WITH CORONARY ANGIOGRAM;  Surgeon: Leonie Man, MD;  Location[facility] C[facility] LAB;  Service: Cardiovascular;  Laterality: N/A;   Family History  Problem Relation Age of Onset  . Heart attack Father 18  . COPD Mother 33   Social History  Substance Use Topics  . Smoking status: Former Smoker    Quit date: 06/05/2012  . Smokeless tobacco: Never Used  . Alcohol Use: No    Review of Systems  Constitutional: Negative for fever and chills.  HENT: Negative for congestion and facial swelling.   Eyes: Negative for discharge and visual disturbance.  Respiratory: Positive for shortness of breath.   Cardiovascular: Positive for chest pain. Negative for palpitations.  Gastrointestinal: Positive for nausea. Negative for vomiting, abdominal pain and diarrhea.  Musculoskeletal: Negative for myalgias and arthralgias.  Skin: Negative for color change and rash.  Neurological: Positive for light-headedness. Negative for tremors, syncope and headaches.  Psychiatric/Behavioral: Negative for confusion and dysphoric mood.      Allergies  Penicillins  Home Medications   Prior  to Admission medications   Medication Sig Start Date End Date Taking? Authorizing Provider  aspirin EC 81 MG tablet Take 81 mg by mouth daily.    Historical Provider, MD  atenolol (TENORMIN) 50 MG tablet Take 1 tablet (50 mg total) by mouth daily. 10/21/14   Lorretta Harp, MD  CALCIUM PO Take 300 mg by mouth daily.    Historical Provider, MD  Cholecalciferol (VITAMIN D3) 1000 UNITS CAPS Take 2,000 Units by mouth daily.     Historical Provider,  MD  clopidogrel (PLAVIX) 75 MG tablet Take 1 tablet (75 mg total) by mouth daily. 12/15/14   Lorretta Harp, MD  isosorbide mononitrate (IMDUR) 30 MG 24 hr tablet Take 1 tablet by mouth daily. 10/05/14   Historical Provider, MD  losartan (COZAAR) 100 MG tablet Take 0.5 tablets (50 mg total) by mouth daily. 10/23/14   Lorretta Harp, MD  Multiple Vitamins-Minerals (CENTRUM SILVER PO) Take 1 tablet by mouth daily.    Historical Provider, MD  nitroGLYCERIN (NITROSTAT) 0.4 MG SL tablet Place 1 tablet (0.4 mg total) under the tongue every 5 (five) minutes x 3 doses as needed for chest pain. 10/06/14   Eileen Stanford, PA-C  Omega-3 Fatty Acids (OMEGA-3 PO) Take 1,000 mg by mouth daily.     Historical Provider, MD  pantoprazole (PROTONIX) 40 MG tablet Take 1 tablet (40 mg total) by mouth daily at 6 (six) AM. Patient taking differently: Take 20 mg by mouth daily at 6 (six) AM.  10/06/14   Eileen Stanford, PA-C  rosuvastatin (CRESTOR) 20 MG tablet Take 1 tablet (20 mg total) by mouth daily. 10/06/14   Eileen Stanford, PA-C  sildenafil (VIAGRA) 25 MG tablet Take 1 tablet (25 mg total) by mouth daily as needed for erectile dysfunction (Do not Imdur the day you take Viagra). 05/28/15   Brett Canales, PA-C   BP 153/91 mmHg  Pulse 86  Temp(Src) 98.6 F (37 C) (Oral)  Resp 18  SpO2 95% Physical Exam  Constitutional: He is oriented to person, place, and time. He appears well-developed and well-nourished.  HENT:  Head: Normocephalic and atraumatic.  Eyes: EOM are normal. Pupils are equal, round, and reactive to light.  Neck: Normal range of motion. Neck supple. No JVD present.  Cardiovascular: Normal rate and regular rhythm.  Exam reveals no gallop and no friction rub.   No murmur heard. Pulmonary/Chest: No respiratory distress. He has no wheezes.  Abdominal: He exhibits no distension. There is no rebound and no guarding.  Musculoskeletal: Normal range of motion.  Neurological: He is alert and  oriented to person, place, and time.  Skin: No rash noted. No pallor.  Psychiatric: He has a normal mood and affect. His behavior is normal.  Nursing note and vitals reviewed.   ED Course  Procedures (including critical care time) Labs Review Labs Reviewed  BASIC METABOLIC PANEL - Abnormal; Notable for the following:    Glucose, Bld 147 (*)    All other components within normal limits  CBC  I-STAT TROPOININ, ED    Imaging Review Dg Chest 2 View  06/18/2015  CLINICAL DATA:  Chest tightness and shortness of breath for 2 days. Initial encounter. EXAM: CHEST  2 VIEW COMPARISON:  Single view of the chest 10/21/2014. PA and lateral chest 10/05/2014. FINDINGS: The lungs are clear. Heart size is normal. No pneumothorax or pleural effusion. No focal bony abnormality. IMPRESSION: No acute disease. Electronically Signed   By: Inge Rise  M.D.   On: 06/18/2015 17:37   I have personally reviewed and evaluated these images and lab results as part of my medical decision-making.   EKG Interpretation   Date/Time:  Friday June 18 2015 19:51:02 EST Ventricular Rate:  78 PR Interval:  154 QRS Duration: 101 QT Interval:  392 QTC Calculation: 446 R Axis:   78 Text Interpretation:  Sinus rhythm No significant change since last  tracing Confirmed by Erynne Kealey MD, Quillian Quince [id number]) on 06/18/2015 8:38:12 PM      MDM   Final diagnoses:  Angina at rest [facility])    65 yo M with a chief complaint of chest pain. Concerning for unstable angina. Discussed with cardiologist. Will admit.  The patients results and plan were reviewed and discussed.   Any x-rays performed were independently reviewed by myself.   Differential diagnosis were considered with the presenting HPI.  Medications  aspirin EC tablet 81 mg (81 mg Oral Given 06/19/15 0759)  nitroGLYCERIN (NITROSTAT) SL tablet 0.4 mg (not administered)  atenolol (TENORMIN) tablet 50 mg (50 mg Oral Given 06/19/15 0800)  losartan (COZAAR) tablet 50 mg (50  mg Oral Given 06/19/15 0800)  isosorbide mononitrate (IMDUR) 24 hr tablet 60 mg (60 mg Oral Given 06/19/15 0800)  clopidogrel (PLAVIX) tablet 75 mg (75 mg Oral Given 06/19/15 0801)  rosuvastatin (CRESTOR) tablet 20 mg (not administered)  famotidine (PEPCID) tablet 20 mg (20 mg Oral Given 06/19/15 0801)  acetaminophen (TYLENOL) tablet 650 mg (not administered)  ondansetron (ZOFRAN) injection 4 mg (not administered)  enoxaparin (LOVENOX) injection 40 mg (40 mg Subcutaneous Not Given 06/19/15 0759)  alum & mag hydroxide-simeth (MAALOX/MYLANTA) 200-200-20 MG/5ML suspension 15 mL (15 mLs Oral Given 06/19/15 0519)  aspirin chewable tablet 243 mg (243 mg Oral Given 06/18/15 2125)    Filed Vitals:   06/18/15 2200 06/18/15 2203 06/19/15 0000 06/19/15 0430  BP:  167/86 162/76 135/64  Pulse:  81 94 84  Temp:  98.5 F (36.9 C) 98.4 F (36.9 C) 97.8 F (36.6 C)  TempSrc:  Oral Oral Oral  Resp:  20 18 18   Height: 5\' 6"  (1.676 m) 5\' 6"  (1.676 m)    Weight: 200 lb 9.9 oz (91 kg) 200 lb 11.2 oz (91.037 kg)  202 lb 2.6 oz (91.7 kg)  SpO2:  97% 98% 96%    Final diagnoses:  Angina at rest [facility])    Admission/ observation were discussed with the admitting physician, patient and/or family and they are comfortable with the plan.      Deno Etienne, DO 06/19/15 1455

## 2015-06-18 NOTE — ED Notes (Signed)
Pt here for chest pain that started last night. sts he took isosorbide and layed down and felt better. sts layed around today and went to get in the shower and chest became really tight and SOB.

## 2015-06-18 NOTE — H&P (Signed)
Primary cardiologist: Dr. Gwenlyn Found -- last visit 05/25/2015  CC: CP  HPI: 89 CA man with CAD prior PCI to RCA in 05/2012 and OM2/PLA1 in 05/2014 after abnormal myocardial perfusion scan, last cath in 09/2014 without critical stenosis, obesity, left carotid endarterectomy, syncope in 10/2014 when Imdur reduced from 60 mg qd to 30 mg qd, presents with intermittent left sided CP. Sharp stabbing and sometimes pressure like, not related to physical activity, not associated with N/V, fever, cough, diaphoresis. He had one episode yesterday when he was outside in cold weather. Second episode happened today after warm shower when he also had SOB. He also reports that CP increased with cough and movement of torso.    =============== Myoview/Lexiscan 06/07/2014:  IMPRESSION: 1. Mixed pattern of reversible and non reversible decreased myocardial perfusion involving the inferior and inferolateral walls of the LEFT ventricle as above. 2. Dyskinetic appearing inferior wall LEFT ventricle. 3. Left ventricular ejection fraction 59%% 4. Moderate-risk stress test findings*.  ======================= Cath 09/2014:  Left Ventriculography:  EF: 65 %  Wall Motion: Normal Coronary Anatomy:  Dominance: Right  Left Main: Large caliber, long mainstem with a actual septal perforator before trifurcating into the LAD, Ramus Intermedius and Circumflex. Angiographically normal. LAD: Normal caliber vessel that is patent to the apex. There are mild diffuse irregularities with a roughly 40% focal lesion in the mid vessel. No high-grade lesions noted. There are 3 diagonal branches that are at least moderate caliber with minimal luminal irregularities. Left Circumflex: Normal caliber, nondominant vessel with 2 obtuse marginal branches. First OM is patent and a second OM are widely patent proximal stent. The remainder of both OM vessels are free of disease. The small AV groove branch and terminates as a small posterolateral  artery with no significant disease.   OM1: small-moderate caliber vessel; angiographic normal.   OM 2: small-moderate-caliber vessel with widely patent proximal stent. Ramus intermedius: moderate large-caliber vessel that bifurcates in the mid vessel into 2 small moderate caliber branches. Mild luminal irregularities   RCA: Large-caliber, dominant vessel with a high, anterior origin. The vessel is extremely tortuous with at least 3 major bends. It is heavily stented throughout the entire mid segment with mild in-stent stenosis of roughly 30-40%. The vessel bifurcates distally into the RPDA and the Right Posterior AV Groove Branch (RPAV)  RPDA: Moderate caliber vessel. Angiographically normal.  RPL Sysytem:The RPAV begins as a small moderate caliber vessel it bifurcates into 2 posterolateral branches. There is a widely patent stent from the RPAV into RPL 1. No problems with flow in the jailed RPL 2.  =====================    Review of Systems:  10 systems reviewed unremarkable except as noted in HPI   Past Medical History  Diagnosis Date  . Hypertension   . Obesity   . HLD (hyperlipidemia)   . Coronary artery disease     a. s/p DES x 2 to the RCA (05/2012 at [facility]) b. s/p DES of OM and PLA branches (05/2014)  c. s/p LHC on 10/06/14 with patent stents and non-obs CAD   . GERD (gastroesophageal reflux disease)   . H/O carotid endarterectomy   . Complication of anesthesia     " i had complications with it in 1991"  . Syncope     a. 10/2014 - felt to be 2/2 hypotension after recent imdur titration.    No current facility-administered medications on file prior to encounter.   Current Outpatient Prescriptions on File Prior to Encounter  Medication Sig Dispense Refill  .  aspirin EC 81 MG tablet Take 81 mg by mouth daily.    Marland Kitchen atenolol (TENORMIN) 50 MG tablet Take 1 tablet (50 mg total) by mouth daily. 90 tablet 3  . CALCIUM PO Take 300 mg by mouth daily.    . Cholecalciferol  (VITAMIN D3) 1000 UNITS CAPS Take 2,000 Units by mouth daily.     . clopidogrel (PLAVIX) 75 MG tablet Take 1 tablet (75 mg total) by mouth daily. 90 tablet 3  . isosorbide mononitrate (IMDUR) 30 MG 24 hr tablet Take 1 tablet by mouth daily.    Marland Kitchen losartan (COZAAR) 100 MG tablet Take 0.5 tablets (50 mg total) by mouth daily. 90 tablet 3  . Multiple Vitamins-Minerals (CENTRUM SILVER PO) Take 1 tablet by mouth daily.    . nitroGLYCERIN (NITROSTAT) 0.4 MG SL tablet Place 1 tablet (0.4 mg total) under the tongue every 5 (five) minutes x 3 doses as needed for chest pain. 25 tablet 12  . Omega-3 Fatty Acids (OMEGA-3 PO) Take 1,000 mg by mouth daily.     . pantoprazole (PROTONIX) 40 MG tablet Take 1 tablet (40 mg total) by mouth daily at 6 (six) AM. (Patient taking differently: Take 20 mg by mouth daily at 6 (six) AM. ) 30 tablet 11  . rosuvastatin (CRESTOR) 20 MG tablet Take 1 tablet (20 mg total) by mouth daily. 30 tablet 11  . sildenafil (VIAGRA) 25 MG tablet Take 1 tablet (25 mg total) by mouth daily as needed for erectile dysfunction (Do not Imdur the day you take Viagra). 10 tablet 0      Allergies  Allergen Reactions  . Penicillins Rash    Has patient had a PCN reaction causing immediate rash, facial/tongue/throat swelling, SOB or lightheadedness with hypotension: YES Has patient had a PCN reaction causing severe rash involving mucus membranes or skin necrosis: NO Has patient had a PCN reaction that required hospitalization NO Has patient had a PCN reaction occurring within the last 10 years: NO If all of the above answers are "NO", then may proceed with Cephalosporin use.     Social History   Social History  . Marital Status: Divorced    Spouse Name: N/A  . Number of Children: N/A  . Years of Education: N/A   Occupational History  . 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 Smoking status: Former Smoker    Quit date: 06/05/2012  . Smokeless tobacco:  Never Used  . Alcohol Use: No  . Drug Use: No  . Sexual Activity: Not on file   Other Topics Concern  . Not on file   Social History Narrative   Lives alone.    Family History  Problem Relation Age of Onset  . Heart attack Father 27  . COPD Mother 53    PHYSICAL EXAM: Filed Vitals:   06/18/15 1930 06/18/15 1945  BP: 142/119 131/66  Pulse: 82 84  Temp:    Resp: 16 16   General:  Well appearing. No respiratory difficulty HEENT: normal Neck: supple. no JVD. Carotids 2+ bilat; no bruits. No lymphadenopathy or thryomegaly appreciated. Cor: PMI nondisplaced. Regular rate & rhythm. No rubs, gallops or murmurs. Lungs: clear Abdomen: soft, nontender, nondistended. No hepatosplenomegaly. No bruits or masses. Good bowel sounds. Extremities: no cyanosis, clubbing, rash, edema Neuro: alert & oriented x 3, cranial nerves grossly intact. moves all 4 extremities w/o difficulty. Affect pleasant.  ECG: NSR, normal AV conduction, narrow QRS, no acute ST-T changes  Results for orders placed or performed during the hospital encounter of 06/18/15 (from the past 24 hour(s))  Basic metabolic panel     Status: Abnormal   Collection Time: 06/18/15  4:32 PM  Result Value Ref Range   Sodium 144 135 - 145 mmol/L   Potassium 4.3 3.5 - 5.1 mmol/L   Chloride 103 101 - 111 mmol/L   CO2 30 22 - 32 mmol/L   Glucose, Bld 147 (H) 65 - 99 mg/dL   BUN 14 6 - 20 mg/dL   Creatinine, Ser 0.97 0.61 - 1.24 mg/dL   Calcium 9.5 8.9 - 10.3 mg/dL   GFR calc non Af Amer >60 >60 mL/min   GFR calc Af Amer >60 >60 mL/min   Anion gap 11 5 - 15  CBC     Status: None   Collection Time: 06/18/15  4:32 PM  Result Value Ref Range   WBC 6.4 4.0 - 10.5 K/uL   RBC 4.76 4.22 - 5.81 MIL/uL   Hemoglobin 14.5 13.0 - 17.0 g/dL   HCT 43.7 39.0 - 52.0 %   MCV 91.8 78.0 - 100.0 fL   MCH 30.5 26.0 - 34.0 pg   MCHC 33.2 30.0 - 36.0 g/dL   RDW 13.1 11.5 - 15.5 %   Platelets 257 150 - 400 K/uL  I-stat troponin, ED (not at  [facility])     Status: None   Collection Time: 06/18/15  4:52 PM  Result Value Ref Range   Troponin i, poc 0.00 0.00 - 0.08 ng/mL   Comment 3           Dg Chest 2 View  06/18/2015  CLINICAL DATA:  Chest tightness and shortness of breath for 2 days. Initial encounter. EXAM: CHEST  2 VIEW COMPARISON:  Single view of the chest 10/21/2014. PA and lateral chest 10/05/2014. FINDINGS: The lungs are clear. Heart size is normal. No pneumothorax or pleural effusion. No focal bony abnormality. IMPRESSION: No acute disease. Electronically Signed   By: Inge Rise M.D.   On: 06/18/2015 17:37     ASSESSMENT:  1. CP, appears atypical for angina, in the setting of known CAD and prior PCI  - Initial Trop negative; no ischemia on ECG - also has significant history of GERD - stable hemodynamically and now CP free   PLAN/DISCUSSION:  - Admit for OBS to cardiology to rule out MI  - Continue home meds. Will consider increasing his imdur back to 60 mg po qam and reducing Losartan from 100 to 50 mg qd.   Please refer to orders for other details    Wandra Mannan, MD Cardiology

## 2015-06-19 DIAGNOSIS — I200 Unstable angina: Secondary | ICD-10-CM | POA: Diagnosis not present

## 2015-06-19 LAB — TROPONIN I: Troponin I: <0.03 ng/mL (ref <0.031)

## 2015-06-19 MED ORDER — ALUM & MAG HYDROXIDE-SIMETH 200-200-20 MG/5ML PO SUSP
15.0000 mL | Freq: Four times a day (QID) | ORAL | Status: DC | PRN
  Administered 2015-06-19: 15 mL via ORAL
  Filled 2015-06-19: qty 30

## 2015-06-19 MED ORDER — ALUM & MAG HYDROXIDE-SIMETH 200-200-20 MG/5ML PO SUSP
30.0000 mL | ORAL | Status: DC | PRN
  Administered 2015-06-19: 30 mL via ORAL
  Filled 2015-06-19: qty 30

## 2015-06-19 NOTE — Progress Notes (Signed)
Subjective:  No recurrent pain since admission.  Objective:  Vital Signs in the last 24 hours: BP 135/64 mmHg  Pulse 84  Temp(Src) 97.8 F (36.6 C) (Oral)  Resp 18  Ht 5\' 6"  (1.676 m)  Wt 91.7 kg (202 lb 2.6 oz)  BMI 32.65 kg/m2  SpO2 96%  Physical Exam: Talkative anxious male in no acute distress Lungs:  Clear Cardiac:  Regular rhythm, normal S1 and S2, no S3 Extremities:  No edema present  Intake/Output from previous day:    Weight Filed Weights   06/18/15 2200 06/18/15 2203 06/19/15 0430  Weight: 91 kg (200 lb 9.9 oz) 91.037 kg (200 lb 11.2 oz) 91.7 kg (202 lb 2.6 oz)    Lab Results: Basic Metabolic Panel:  Recent Labs  06/18/15 1632  NA 144  K 4.3  CL 103  CO2 30  GLUCOSE 147*  BUN 14  CREATININE 0.97   CBC:  Recent Labs  06/18/15 1632  WBC 6.4  HGB 14.5  HCT 43.7  MCV 91.8  PLT 257   Cardiac Enzymes: Troponin (Point of Care Test)  Recent Labs  06/18/15 1652  TROPIPOC 0.00   Cardiac Panel (last 3 results)  Recent Labs  06/19/15 0037 06/19/15 0458  TROPONINI <0.03 <0.03    Telemetry: Sinus rhythm  Assessment/Plan:  1.  Chest pain with some typical other atypical features-previous myocardial perfusion scanning has been indeterminant with mixed ischemia pattern 2.  Coronary artery disease with previous stenting of the right coronary artery and circumflex with previous catheterization in April 2016   Recommendations:  Talkative and quite difficult to assess.  He basically came here at the insistence of his family and was not planning on coming himself.  I don't see a role for nuclear stress testing at this point in light of his previous results.  We'll make medication adjustments and observe in hospital if he has no recurrent pain could go home with medication adjustments.     Kerry Hough  MD [facility] Cardiology  06/19/2015, 10:05 AM

## 2015-06-19 NOTE — Plan of Care (Signed)
Problem: Phase II Progression Outcomes Goal: Anginal pain relieved Outcome: Progressing Pt medications readjusted, 1 episode pf CP this am while ambulating, quickly resolved. No complaints since of chest discomfort.

## 2015-06-19 NOTE — Progress Notes (Signed)
Received pt via w/c from ER. VS and weight obtained. Pt presently chest pain free. Placed on telemetry and verified with [facility].

## 2015-06-19 NOTE — Progress Notes (Signed)
Pt ambulated in hall 500 ft, no assistive devices, when we reached outside of room had a moment of 4/10 chest pain, described as a sharp sensation near nipple.  resolved in less than a few seconds. Cp free.

## 2015-06-20 ENCOUNTER — Encounter (HOSPITAL_COMMUNITY): Payer: Self-pay | Admitting: Physician Assistant

## 2015-06-20 DIAGNOSIS — I200 Unstable angina: Secondary | ICD-10-CM | POA: Diagnosis not present

## 2015-06-20 DIAGNOSIS — K219 Gastro-esophageal reflux disease without esophagitis: Secondary | ICD-10-CM | POA: Diagnosis present

## 2015-06-20 DIAGNOSIS — I2510 Atherosclerotic heart disease of native coronary artery without angina pectoris: Secondary | ICD-10-CM | POA: Diagnosis present

## 2015-06-20 MED ORDER — ISOSORBIDE MONONITRATE ER 60 MG PO TB24: 60.0000 mg | ORAL_TABLET | Freq: Every day | ORAL | Status: DC

## 2015-06-20 MED ORDER — LOSARTAN POTASSIUM 25 MG PO TABS: 25.0000 mg | ORAL_TABLET | Freq: Every day | ORAL | Status: DC

## 2015-06-20 NOTE — Progress Notes (Signed)
Subjective:  No recurrent pain since admission.  Was ambulatory in hall yesterday.  Objective:  Vital Signs in the last 24 hours: BP 109/65 mmHg  Pulse 105  Temp(Src) 98 F (36.7 C) (Oral)  Resp 16  Ht 5\' 6"  (1.676 m)  Wt 91.627 kg (202 lb)  BMI 32.62 kg/m2  SpO2 94%  Physical Exam: Talkative anxious male in no acute distress Lungs:  Clear Cardiac:  Regular rhythm, normal S1 and S2, no S3 Extremities:  No edema present  Intake/Output from previous day: 01/07 0701 - 01/08 0700 In: 960 [P.O.:960] Out: -   Weight Filed Weights   06/18/15 2203 06/19/15 0430 06/20/15 0451  Weight: 91.037 kg (200 lb 11.2 oz) 91.7 kg (202 lb 2.6 oz) 91.627 kg (202 lb)    Lab Results: Basic Metabolic Panel:  Recent Labs  06/18/15 1632  NA 144  K 4.3  CL 103  CO2 30  GLUCOSE 147*  BUN 14  CREATININE 0.97   CBC:  Recent Labs  06/18/15 1632  WBC 6.4  HGB 14.5  HCT 43.7  MCV 91.8  PLT 257   Cardiac Enzymes:  Cardiac Panel (last 3 results)  Recent Labs  06/19/15 0037 06/19/15 0458  TROPONINI <0.03 <0.03    Telemetry: Sinus rhythm  Assessment/Plan:  1.  Chest pain with some typical other atypical features-previous myocardial perfusion scanning has been indeterminant with mixed ischemia pattern 2.  Coronary artery disease with previous stenting of the right coronary artery and circumflex with previous catheterization in April 2016   Recommendations:  He has had no recurrent symptoms since admission.  His blood pressure has been stable.  Will discharge with a change in his medical regimen with increasing his Imdur to 60 mg but reduce and his losartan to 50 mg on discharge.  Okay for discharge today.     Kerry Hough  MD [facility] Cardiology  06/20/2015, 9:46 AM

## 2015-06-20 NOTE — Discharge Summary (Signed)
Discharge Summary   Patient ID: Reginald Little MRN: [id number], DOB/AGE: 66/31/1952 66 y.o. Admit date: 06/18/2015 D/C date:     06/20/2015  Primary Cardiologist: Dr. Gwenlyn Found  Principal Problem:   Chest pain Active Problems:   Obesity (BMI 30.0-34.9)   Hyperlipidemia   Carotid artery disease (HCC)   CAD S/P percutaneous coronary angioplasty   Syncope   GERD (gastroesophageal reflux disease)   Coronary artery disease   Hypertension    Admission Dates: 06/18/15-06/20/15 Discharge Diagnosis: chest pain observation- increased long acting nitrates   HPI: Reginald Little is a 65 y.o. male with a history of CAD s/p previous stenting, carotid artery disease s/p L CEA, obesity, syncope (10/2014), HLD, HTN and GERD who presented to [facility] on 06/18/15 with chest pain.   He has a history of CAD with prior PCI to RCA in 05/2012 and OM2/PLA1 in 05/2014 after abnormal myocardial perfusion scan. His last cath was in 09/2014 without critical stenosis. He had an episode of syncope in 10/2014 and Imdur was reduced from 60 mg qd to 30 mg qd.  His last nuclear stress test in 05/2015 showed a mixed pattern of reversible and non reversible decreased myocardial perfusion of in the inferior and inferolateral walls. EF 59%. Subsequent cath 09/2014 with no critical stenosis or intervention  He presented this most recent admission with intermittent left sided CP. It was sharp stabbing and sometimes pressure like, not related to physical activity, not associated with N/V, fever, cough, diaphoresis. He had one episode the day prior to admission when he was outside in cold weather. He had a second episode that was associated with SOB that happened the next day after a warm shower. He also reported that CP increased with cough and movement of torso. The patient was not going to be evaluated but his family insisted that he go to the hospital for evaluation. He was admitted for further observation.   Hospital Course  Chest pain: felt to  have both typical and atypical features -- He did not have any recurrent chest pain. He ambulated the halls without symptoms. -- Troponin neg x3 and ECG without acute ST or TW changes -- Previous myocardial perfusion scanning was indeterminant with mixed ischemia pattern previously. Therefore, there was felt to be no role for repeat nuclear stress testing in light of his previous results. -- His imdur was increased from 30mg  to 60mg  and his Losartan was decreased to from 50mg  to 25mg .  -- Continue ASA/plavix, statin and BB  HTN: BP well controlled. Long acting nitrates increased and ARB decreased as above. Continue Atenolol 50mg  daily  HLD: continue Crestor 20mg    The patient has had an uncomplicated hospital course and is recovering well. He has been seen by Dr. Wynonia Lawman today and deemed ready for discharge home. A staff message to arrange follow-up appointment has been scheduled. Discharge medications are listed below.   Discharge Vitals: Blood pressure 154/80, pulse 101, temperature 98 F (36.7 C), temperature source Oral, resp. rate 16, height 5\' 6"  (1.676 m), weight 202 lb (91.627 kg), SpO2 94 %.  Labs: Lab Results  Component Value Date   WBC 6.4 06/18/2015   HGB 14.5 06/18/2015   HCT 43.7 06/18/2015   MCV 91.8 06/18/2015   PLT 257 06/18/2015     Recent Labs Lab 06/18/15 1632  NA 144  K 4.3  CL 103  CO2 30  BUN 14  CREATININE 0.97  CALCIUM 9.5  GLUCOSE 147*    Recent Labs  06/19/15 0037 06/19/15 0458  TROPONINI <0.03 <0.03   Lab Results  Component Value Date   CHOL 128 10/06/2014   HDL 29* 10/06/2014   LDLCALC 80 10/06/2014   TRIG 96 10/06/2014   No results found for: DDIMER  Diagnostic Studies/Procedures   Dg Chest 2 View  06/18/2015  CLINICAL DATA:  Chest tightness and shortness of breath for 2 days. Initial encounter. EXAM: CHEST  2 VIEW COMPARISON:  Single view of the chest 10/21/2014. PA and lateral chest 10/05/2014. FINDINGS: The lungs are clear.  Heart size is normal. No pneumothorax or pleural effusion. No focal bony abnormality. IMPRESSION: No acute disease. Electronically Signed   By: Inge Rise M.D.   On: 06/18/2015 17:37    =============== Myoview/Lexiscan 06/07/2014:  IMPRESSION: 1. Mixed pattern of reversible and non reversible decreased myocardial perfusion involving the inferior and inferolateral walls of the LEFT ventricle as above. 2. Dyskinetic appearing inferior wall LEFT ventricle. 3. Left ventricular ejection fraction 59%% 4. Moderate-risk stress test findings*.  ======================= Cath 09/2014:  Left Ventriculography:  EF: 65 %  Wall Motion: Normal Coronary Anatomy:  Dominance: Right  Left Main: Large caliber, long mainstem with a actual septal perforator before trifurcating into the LAD, Ramus Intermedius and Circumflex. Angiographically normal. LAD: Normal caliber vessel that is patent to the apex. There are mild diffuse irregularities with a roughly 40% focal lesion in the mid vessel. No high-grade lesions noted. There are 3 diagonal branches that are at least moderate caliber with minimal luminal irregularities. Left Circumflex: Normal caliber, nondominant vessel with 2 obtuse marginal branches. First OM is patent and a second OM are widely patent proximal stent. The remainder of both OM vessels are free of disease. The small AV groove branch and terminates as a small posterolateral artery with no significant disease.   OM1: small-moderate caliber vessel; angiographic normal.   OM 2: small-moderate-caliber vessel with widely patent proximal stent. Ramus intermedius: moderate large-caliber vessel that bifurcates in the mid vessel into 2 small moderate caliber branches. Mild luminal irregularities   RCA: Large-caliber, dominant vessel with a high, anterior origin. The vessel is extremely tortuous with at least 3 major bends. It is heavily stented throughout the entire mid segment with mild  in-stent stenosis of roughly 30-40%. The vessel bifurcates distally into the RPDA and the Right Posterior AV Groove Branch (RPAV)  RPDA: Moderate caliber vessel. Angiographically normal. RPL Sysytem:The RPAV begins as a small moderate caliber vessel it bifurcates into 2 posterolateral branches. There is a widely patent stent from the RPAV into RPL 1. No problems with flow in the jailed RPL 2.     Discharge Medications     Medication List    TAKE these medications        aspirin EC 81 MG tablet  Take 81 mg by mouth daily.     atenolol 50 MG tablet  Commonly known as:  TENORMIN  Take 1 tablet (50 mg total) by mouth daily.     CALCIUM PO  Take 300 mg by mouth daily.     CENTRUM SILVER PO  Take 1 tablet by mouth daily.     clopidogrel 75 MG tablet  Commonly known as:  PLAVIX  Take 1 tablet (75 mg total) by mouth daily.     CoQ10 200 MG Caps  Take 1 capsule by mouth daily.     famotidine 20 MG tablet  Commonly known as:  PEPCID  Take 20 mg by mouth daily.     isosorbide  mononitrate 60 MG 24 hr tablet  Commonly known as:  IMDUR  Take 1 tablet (60 mg total) by mouth daily.     losartan 25 MG tablet  Commonly known as:  COZAAR  Take 1 tablet (25 mg total) by mouth daily.     nitroGLYCERIN 0.4 MG SL tablet  Commonly known as:  NITROSTAT  Place 1 tablet (0.4 mg total) under the tongue every 5 (five) minutes x 3 doses as needed for chest pain.     pantoprazole 40 MG tablet  Commonly known as:  PROTONIX  Take 1 tablet (40 mg total) by mouth daily at 6 (six) AM.     rosuvastatin 20 MG tablet  Commonly known as:  CRESTOR  Take 20 mg by mouth every evening.     rosuvastatin 20 MG tablet  Commonly known as:  CRESTOR  Take 1 tablet (20 mg total) by mouth daily.     sildenafil 25 MG tablet  Commonly known as:  VIAGRA  Take 1 tablet (25 mg total) by mouth daily as needed for erectile dysfunction (Do not Imdur the day you take Viagra).        Disposition   The  patient will be discharged in stable condition to home.  Follow-up Information    Follow up with Quay Burow, MD.   Specialties:  Cardiology, Radiology   Why:  The office will call you to make an appoinment., If you do not hear from them, please contact them., You should be seen within 2-3 weeks.   Contact information:   [address] [place] [address] [phone]         Duration of Discharge Encounter: Greater than 30 minutes including physician and PA time.  Mable Fill R PA-C 06/20/2015, 11:39 AM

## 2015-07-05 ENCOUNTER — Encounter: Payer: Self-pay | Admitting: Physician Assistant

## 2015-07-05 ENCOUNTER — Ambulatory Visit (INDEPENDENT_AMBULATORY_CARE_PROVIDER_SITE_OTHER): Payer: Commercial Managed Care - HMO | Admitting: Physician Assistant

## 2015-07-05 VITALS — BP 150/98 | HR 76 | Ht 66.0 in | Wt 212.0 lb

## 2015-07-05 DIAGNOSIS — I10 Essential (primary) hypertension: Secondary | ICD-10-CM

## 2015-07-05 DIAGNOSIS — I208 Other forms of angina pectoris: Secondary | ICD-10-CM

## 2015-07-05 DIAGNOSIS — Z9861 Coronary angioplasty status: Secondary | ICD-10-CM | POA: Diagnosis not present

## 2015-07-05 DIAGNOSIS — I2510 Atherosclerotic heart disease of native coronary artery without angina pectoris: Secondary | ICD-10-CM | POA: Diagnosis not present

## 2015-07-05 MED ORDER — FUROSEMIDE 20 MG PO TABS: 20.0000 mg | ORAL_TABLET | Freq: Every day | ORAL | Status: DC

## 2015-07-05 NOTE — Patient Instructions (Addendum)
Your physician recommends that you schedule a follow-up appointment with Dr.Berry Tuesday 09/14/15 at 11:45 am.   Lab ( [facility] ) before appointment with Dr.Berry   Start Lasix 20 mg daily   Call before 09/14/15 if needed

## 2015-07-05 NOTE — Progress Notes (Signed)
Cardiology Office Note   Date:  07/05/2015   ID:  Reginald Little, DOB December 05, 1950, MRN [id number]  PCP:  Mathews Argyle, MD  Cardiologist:  Dr. Stacy Gardner, PA-C   Chief Complaint  Patient presents with  . Follow-up    no chest pain, no shortness of breath, some swelling, some cramping, some lightheadedness    History of Present Illness: Reginald Little is a 65 y.o. male with a history of PCI to RCA in 05/2012 and OM2/PLA1 in 05/2014, cath 09/2014 w/out critical dz, HTN, HL, obesity, GERD, DM, syncope (hypotension), L-CEA  Discharged 01/06After an admission for chest pain, medical management recommended  Reginald Little presents for post hospital follow-up   since discharge from the hospital, Reginald Little has not had chest pain. He has had some dyspnea on exertion, but feels this is at baseline. He has not been checking daily weights. He checks his blood pressure on a regular basis and sometimes at rest, it will be in the 120s. However, with minimal exertion, it will go up. He had radium treatments for a medical condition when he was very young. They quit giving him the treatments because of the amount of radiation he had had. He wonders if that is affecting the circulation in his arm or his blood pressure readings on that side.    He has pedal edema that he has on a daily basis and this has not changed recently. He was told that he has prediabetes. He is not checking his blood sugars, but is limiting his carbohydrates, and following his weight. He has not had any presyncope or syncope since discharge.   Past Medical History  Diagnosis Date  . Hypertension   . Obesity   . HLD (hyperlipidemia)   . Coronary artery disease     a. s/p DES x 2 to the RCA (05/2012 at [facility]) b. s/p DES of OM and PLA branches (05/2014)  c. s/p LHC on 10/06/14 with patent stents and non-obs CAD   . GERD (gastroesophageal reflux disease)   . Carotid artery disease ([place])     a. s/p L CEA  . Syncope    a. 10/2014 - felt to be 2/2 hypotension after recent imdur titration.  . Diabetes mellitus without complication ([place])     a. diet controlled     Past Surgical History  Procedure Laterality Date  . Coronary angioplasty with stent placement    . Carotid endarterectomy    . Left heart catheterization with coronary angiogram N/A 06/08/2014    Procedure: LEFT HEART CATHETERIZATION WITH CORONARY ANGIOGRAM;  Surgeon: Blane Ohara, MD;  Location: [facility] CATH LAB;  Service: Cardiovascular;  Laterality: N/A;  . Left heart catheterization with coronary angiogram N/A 10/06/2014    Procedure: LEFT HEART CATHETERIZATION WITH CORONARY ANGIOGRAM;  Surgeon: Leonie Man, MD;  Location: [facility] CATH LAB;  Service: Cardiovascular;  Laterality: N/A;    Current Outpatient Prescriptions  Medication Sig Dispense Refill  . aspirin EC 81 MG tablet Take 81 mg by mouth daily.    Marland Kitchen atenolol (TENORMIN) 50 MG tablet Take 1 tablet (50 mg total) by mouth daily. 90 tablet 3  . CALCIUM PO Take 300 mg by mouth daily.    . clopidogrel (PLAVIX) 75 MG tablet Take 1 tablet (75 mg total) by mouth daily. 90 tablet 3  . Coenzyme Q10 (COQ10) 200 MG CAPS Take 1 capsule by mouth daily.    . isosorbide mononitrate (IMDUR) 60 MG 24 hr tablet  Take 1 tablet (60 mg total) by mouth daily. 30 tablet 6  . losartan (COZAAR) 50 MG tablet Take 50 mg by mouth daily.    . Multiple Vitamins-Minerals (CENTRUM SILVER PO) Take 1 tablet by mouth daily.    . nitroGLYCERIN (NITROSTAT) 0.4 MG SL tablet Place 1 tablet (0.4 mg total) under the tongue every 5 (five) minutes x 3 doses as needed for chest pain. 25 tablet 12  . pantoprazole (PROTONIX) 40 MG tablet Take 1 tablet (40 mg total) by mouth daily at 6 (six) AM. 30 tablet 11  . rosuvastatin (CRESTOR) 20 MG tablet Take 1 tablet (20 mg total) by mouth daily. 30 tablet 11  . rosuvastatin (CRESTOR) 20 MG tablet Take 20 mg by mouth every evening.    . sildenafil (VIAGRA) 25 MG tablet Take 1 tablet (25 mg  total) by mouth daily as needed for erectile dysfunction (Do not Imdur the day you take Viagra). 10 tablet 0   No current facility-administered medications for this visit.    Allergies:   Penicillins    Social History:  The patient  reports that he quit smoking about 3 years ago. He has never used smokeless tobacco. He reports that he does not drink alcohol or use illicit drugs.   Family History:  The patient's family history includes COPD (age of onset: 66) in his mother; Heart attack (age of onset: 46) in his father.    ROS:  Please see the history of present illness. All other systems are reviewed and negative.    PHYSICAL EXAM: VS:  BP 150/98 mmHg  Pulse 76  Ht 5\' 6"  (1.676 m)  Wt 212 lb (96.163 kg)  BMI 34.23 kg/m2 , BMI Body mass index is 34.23 kg/(m^2). GEN: Well nourished, well developed, male in no acute distress HEENT: normal for age  Neck: no JVD, no carotid bruit, no masses Cardiac: RRR; no murmur, no rubs, or gallops Respiratory:  clear to auscultation bilaterally, normal work of breathing GI: soft, nontender, nondistended, + BS MS: no deformity or atrophy;  Trace pedal edema; distal pulses are 2+ in all 4 extremities  Skin: warm and dry, no rash Neuro:  Strength and sensation are intact Psych: euthymic mood, full affect   EKG:  EKG is ordered today. The ekg ordered today demonstrates sinus rhythm, no ischemic changes   Recent Labs: 10/05/2014: B Natriuretic Peptide 53.0; TSH 1.219 10/06/2014: ALT 35 06/18/2015: BUN 14; Creatinine, Ser 0.97; Hemoglobin 14.5; Platelets 257; Potassium 4.3; Sodium 144    Lipid Panel    Component Value Date/Time   CHOL 128 10/06/2014 0505   TRIG 96 10/06/2014 0505   HDL 29* 10/06/2014 0505   CHOLHDL 4.4 10/06/2014 0505   VLDL 19 10/06/2014 0505   LDLCALC 80 10/06/2014 0505     Wt Readings from Last 3 Encounters:  07/05/15 212 lb (96.163 kg)  06/20/15 202 lb (91.627 kg)  05/25/15 215 lb (97.523 kg)     Other studies  Reviewed: Additional studies/ records that were reviewed today include: office notes, hospital records.  ASSESSMENT AND PLAN:  1. CAD: Medical mgt recommended. Pt had his Imdur increased and Losartan decreased at recent hospital stay. Continue the ASA, BB, ARB and statin at current doses. No ongoing isch6.mic sx.  2. HTN: SBP 134 L arm and 155 R arm. The radiation to his LUE as a child may have affected that arm. Take BPs in the R arm in the future. Feel he needs better BP control. He  is interested in starting a diuretic to help with BP and edema. Will add Lasix 20 mg daily. Check BMET at his next office visit with Dr Gwenlyn Found. He may need to change the dose after a week or 2, OK to cut it to QOD or PRN once edema is gone, if BP stays controlled.  3. ED: OK to take sildenafil as long as he holds the Imdur on that day.  4. Pre-diabetes: encouraged him to go on a strict ADA diet, info available on their website. Can also start checking his CBG, encouraged him to discuss this with Dr Felipa Eth. A1c was 6.6 09/2014   Current medicines are reviewed at length with the patient today.  The patient has concerns regarding medicines. All concerns were addressed  The following changes have been made:  Add Lasix  Labs/ tests ordered today include:  BMET   Disposition:   FU with Dr Gwenlyn Found  Signed, Lenoard Aden  07/05/2015 3:55 PM    [address] [place], [place], [place]  [address] Phone: [phone]; Fax: [phone]

## 2015-07-14 ENCOUNTER — Ambulatory Visit: Payer: Self-pay | Admitting: Physician Assistant

## 2015-07-14 DIAGNOSIS — Z79899 Other long term (current) drug therapy: Secondary | ICD-10-CM | POA: Diagnosis not present

## 2015-07-14 DIAGNOSIS — Z6830 Body mass index (BMI) 30.0-30.9, adult: Secondary | ICD-10-CM | POA: Diagnosis not present

## 2015-07-14 DIAGNOSIS — E119 Type 2 diabetes mellitus without complications: Secondary | ICD-10-CM | POA: Diagnosis not present

## 2015-07-14 DIAGNOSIS — Z23 Encounter for immunization: Secondary | ICD-10-CM | POA: Diagnosis not present

## 2015-07-14 DIAGNOSIS — E669 Obesity, unspecified: Secondary | ICD-10-CM | POA: Diagnosis not present

## 2015-07-14 DIAGNOSIS — I10 Essential (primary) hypertension: Secondary | ICD-10-CM | POA: Diagnosis not present

## 2015-07-16 ENCOUNTER — Other Ambulatory Visit: Payer: Self-pay | Admitting: *Deleted

## 2015-07-16 MED ORDER — FUROSEMIDE 20 MG PO TABS
20.0000 mg | ORAL_TABLET | Freq: Every day | ORAL | Status: DC
Start: 2015-07-16 — End: 2015-09-27

## 2015-07-16 NOTE — Telephone Encounter (Signed)
Rx request sent to pharmacy.  

## 2015-07-20 ENCOUNTER — Other Ambulatory Visit: Payer: Self-pay | Admitting: Cardiovascular Disease

## 2015-07-29 DIAGNOSIS — H16012 Central corneal ulcer, left eye: Secondary | ICD-10-CM | POA: Diagnosis not present

## 2015-08-02 DIAGNOSIS — H1789 Other corneal scars and opacities: Secondary | ICD-10-CM | POA: Diagnosis not present

## 2015-08-18 DIAGNOSIS — I10 Essential (primary) hypertension: Secondary | ICD-10-CM | POA: Diagnosis not present

## 2015-08-18 DIAGNOSIS — I208 Other forms of angina pectoris: Secondary | ICD-10-CM | POA: Diagnosis not present

## 2015-08-18 DIAGNOSIS — I209 Angina pectoris, unspecified: Secondary | ICD-10-CM | POA: Diagnosis not present

## 2015-08-19 LAB — BASIC METABOLIC PANEL
BUN: 16 mg/dL (ref 7–25)
CALCIUM: 9.4 mg/dL (ref 8.6–10.3)
CO2: 32 mmol/L — AB (ref 20–31)
Chloride: 101 mmol/L (ref 98–110)
Creat: 0.94 mg/dL (ref 0.70–1.25)
GLUCOSE: 145 mg/dL — AB (ref 65–99)
Potassium: 4.1 mmol/L (ref 3.5–5.3)
Sodium: 141 mmol/L (ref 135–146)

## 2015-08-23 ENCOUNTER — Encounter (HOSPITAL_COMMUNITY): Payer: Self-pay | Admitting: Family Medicine

## 2015-08-23 ENCOUNTER — Emergency Department (HOSPITAL_COMMUNITY): Payer: Commercial Managed Care - HMO

## 2015-08-23 ENCOUNTER — Emergency Department (HOSPITAL_COMMUNITY)
Admission: EM | Admit: 2015-08-23 | Discharge: 2015-08-23 | Disposition: A | Discharge status: Home / Self Care | Payer: Commercial Managed Care - HMO | Attending: Emergency Medicine | Admitting: Emergency Medicine

## 2015-08-23 DIAGNOSIS — E785 Hyperlipidemia, unspecified: Secondary | ICD-10-CM | POA: Diagnosis not present

## 2015-08-23 DIAGNOSIS — Z87891 Personal history of nicotine dependence: Secondary | ICD-10-CM | POA: Diagnosis not present

## 2015-08-23 DIAGNOSIS — I10 Essential (primary) hypertension: Secondary | ICD-10-CM | POA: Diagnosis not present

## 2015-08-23 DIAGNOSIS — E669 Obesity, unspecified: Secondary | ICD-10-CM | POA: Insufficient documentation

## 2015-08-23 DIAGNOSIS — K219 Gastro-esophageal reflux disease without esophagitis: Secondary | ICD-10-CM | POA: Insufficient documentation

## 2015-08-23 DIAGNOSIS — Z7902 Long term (current) use of antithrombotics/antiplatelets: Secondary | ICD-10-CM | POA: Diagnosis not present

## 2015-08-23 DIAGNOSIS — I2510 Atherosclerotic heart disease of native coronary artery without angina pectoris: Secondary | ICD-10-CM | POA: Insufficient documentation

## 2015-08-23 DIAGNOSIS — R079 Chest pain, unspecified: Secondary | ICD-10-CM | POA: Diagnosis not present

## 2015-08-23 DIAGNOSIS — Z9889 Other specified postprocedural states: Secondary | ICD-10-CM | POA: Diagnosis not present

## 2015-08-23 DIAGNOSIS — R0789 Other chest pain: Secondary | ICD-10-CM | POA: Diagnosis not present

## 2015-08-23 DIAGNOSIS — E119 Type 2 diabetes mellitus without complications: Secondary | ICD-10-CM | POA: Insufficient documentation

## 2015-08-23 DIAGNOSIS — Z7982 Long term (current) use of aspirin: Secondary | ICD-10-CM | POA: Diagnosis not present

## 2015-08-23 DIAGNOSIS — R319 Hematuria, unspecified: Secondary | ICD-10-CM | POA: Insufficient documentation

## 2015-08-23 DIAGNOSIS — Z880 Allergy status to penicillin: Secondary | ICD-10-CM | POA: Insufficient documentation

## 2015-08-23 DIAGNOSIS — Z9861 Coronary angioplasty status: Secondary | ICD-10-CM | POA: Diagnosis not present

## 2015-08-23 DIAGNOSIS — R55 Syncope and collapse: Secondary | ICD-10-CM | POA: Insufficient documentation

## 2015-08-23 DIAGNOSIS — Z79899 Other long term (current) drug therapy: Secondary | ICD-10-CM | POA: Insufficient documentation

## 2015-08-23 LAB — URINALYSIS, ROUTINE W REFLEX MICROSCOPIC
GLUCOSE, UA: NEGATIVE mg/dL
Ketones, ur: NEGATIVE mg/dL
Leukocytes, UA: NEGATIVE
Nitrite: NEGATIVE
PROTEIN: NEGATIVE mg/dL
Specific Gravity, Urine: 1.02 (ref 1.005–1.030)
pH: 5.5 (ref 5.0–8.0)

## 2015-08-23 LAB — I-STAT TROPONIN, ED
TROPONIN I, POC: 0 ng/mL (ref 0.00–0.08)
Troponin i, poc: 0 ng/mL (ref 0.00–0.08)

## 2015-08-23 LAB — CBC
HCT: 42.4 % (ref 39.0–52.0)
Hemoglobin: 14.2 g/dL (ref 13.0–17.0)
MCH: 30.3 pg (ref 26.0–34.0)
MCHC: 33.5 g/dL (ref 30.0–36.0)
MCV: 90.4 fL (ref 78.0–100.0)
PLATELETS: 224 10*3/uL (ref 150–400)
RBC: 4.69 MIL/uL (ref 4.22–5.81)
RDW: 12.6 % (ref 11.5–15.5)
WBC: 8.4 10*3/uL (ref 4.0–10.5)

## 2015-08-23 LAB — URINE MICROSCOPIC-ADD ON

## 2015-08-23 LAB — BASIC METABOLIC PANEL
Anion gap: 12 (ref 5–15)
BUN: 14 mg/dL (ref 6–20)
CO2: 29 mmol/L (ref 22–32)
Calcium: 9.4 mg/dL (ref 8.9–10.3)
Chloride: 100 mmol/L — ABNORMAL LOW (ref 101–111)
Creatinine, Ser: 0.95 mg/dL (ref 0.61–1.24)
GFR calc Af Amer: >60 mL/min (ref >60)
GFR calc non Af Amer: >60 mL/min (ref >60)
GLUCOSE: 225 mg/dL — AB (ref 65–99)
POTASSIUM: 4.7 mmol/L (ref 3.5–5.1)
Sodium: 141 mmol/L (ref 135–145)

## 2015-08-23 MED ORDER — CEPHALEXIN 500 MG PO CAPS: 500.0000 mg | ORAL_CAPSULE | Freq: Four times a day (QID) | ORAL | Status: DC

## 2015-08-23 MED ORDER — SODIUM CHLORIDE 0.9 % IV BOLUS (SEPSIS): 1000.0000 mL | Freq: Once | INTRAVENOUS | Status: DC

## 2015-08-23 MED ORDER — CEPHALEXIN 250 MG PO CAPS
500.0000 mg | ORAL_CAPSULE | Freq: Once | ORAL | Status: AC
  Administered 2015-08-23: 500 mg via ORAL
  Filled 2015-08-23: qty 2

## 2015-08-23 NOTE — Discharge Instructions (Signed)
It was our pleasure to provide your ER care today - we hope that you feel better.  Rest. Drink plenty of fluids.  Your urine shows blood in urine, a urine culture was sent the results of which will be back in 2-3 days.  Take antibiotic (keflex) as prescribed. Follow up with urologist in 1 week - see referral - call office to arrange appointment.  For chest discomfort/fainting, follow up with cardiologist in the coming week - see referral - call office to arrange appointment.  Take tylenol/advil as need.  Your blood sugar is high today (225) - drink adequate water, follow diabetic diet, and follow up with primary care doctor in 1 week.  Return to ER if worse, new symptoms, fevers, weak/fainting, trouble breathing, recurrent/persistent chest pain, other concern.     Syncope Syncope is a medical term for fainting or passing out. This means you lose consciousness and drop to the ground. People are generally unconscious for less than 5 minutes. You may have some muscle twitches for up to 15 seconds before waking up and returning to normal. Syncope occurs more often in older adults, but it can happen to anyone. While most causes of syncope are not dangerous, syncope can be a sign of a serious medical problem. It is important to seek medical care.  CAUSES  Syncope is caused by a sudden drop in blood flow to the brain. The specific cause is often not determined. Factors that can bring on syncope include:  Taking medicines that lower blood pressure.  Sudden changes in posture, such as standing up quickly.  Taking more medicine than prescribed.  Standing in one place for too long.  Seizure disorders.  Dehydration and excessive exposure to heat.  Low blood sugar (hypoglycemia).  Straining to have a bowel movement.  Heart disease, irregular heartbeat, or other circulatory problems.  Fear, emotional distress, seeing blood, or severe pain. SYMPTOMS  Right before fainting, you may:  Feel  dizzy or light-headed.  Feel nauseous.  See all white or all black in your field of vision.  Have cold, clammy skin. DIAGNOSIS  Your health care provider will ask about your symptoms, perform a physical exam, and perform an electrocardiogram (ECG) to record the electrical activity of your heart. Your health care provider may also perform other heart or blood tests to determine the cause of your syncope which may include:  Transthoracic echocardiogram (TTE). During echocardiography, sound waves are used to evaluate how blood flows through your heart.  Transesophageal echocardiogram (TEE).  Cardiac monitoring. This allows your health care provider to monitor your heart rate and rhythm in real time.  Holter monitor. This is a portable device that records your heartbeat and can help diagnose heart arrhythmias. It allows your health care provider to track your heart activity for several days, if needed.  Stress tests by exercise or by giving medicine that makes the heart beat faster. TREATMENT  In most cases, no treatment is needed. Depending on the cause of your syncope, your health care provider may recommend changing or stopping some of your medicines. HOME CARE INSTRUCTIONS  Have someone stay with you until you feel stable.  Do not drive, use machinery, or play sports until your health care provider says it is okay.  Keep all follow-up appointments as directed by your health care provider.  Lie down right away if you start feeling like you might faint. Breathe deeply and steadily. Wait until all the symptoms have passed.  Drink enough fluids to  keep your urine clear or pale yellow.  If you are taking blood pressure or heart medicine, get up slowly and take several minutes to sit and then stand. This can reduce dizziness. SEEK IMMEDIATE MEDICAL CARE IF:   You have a severe headache.  You have unusual pain in the chest, abdomen, or back.  You are bleeding from your mouth or  rectum, or you have black or tarry stool.  You have an irregular or very fast heartbeat.  You have pain with breathing.  You have repeated fainting or seizure-like jerking during an episode.  You faint when sitting or lying down.  You have confusion.  You have trouble walking.  You have severe weakness.  You have vision problems. If you fainted, call your local emergency services (911 in [place] Do not drive yourself to the hospital.    This information is not intended to replace advice given to you by your health care provider. Make sure you discuss any questions you have with your health care provider.   Document Released: 05/29/2005 Document Revised: 10/13/2014 Document Reviewed: 07/28/2011 Elsevier Interactive Patient Education 2016 [employer]    Nonspecific Chest Pain  Chest pain can be caused by many different conditions. There is always a chance that your pain could be related to something serious, such as a heart attack or a blood clot in your lungs. Chest pain can also be caused by conditions that are not life-threatening. If you have chest pain, it is very important to follow up with your health care provider. CAUSES  Chest pain can be caused by:  Heartburn.  Pneumonia or bronchitis.  Anxiety or stress.  Inflammation around your heart (pericarditis) or lung (pleuritis or pleurisy).  A blood clot in your lung.  A collapsed lung (pneumothorax). It can develop suddenly on its own (spontaneous pneumothorax) or from trauma to the chest.  Shingles infection (varicella-zoster virus).  Heart attack.  Damage to the bones, muscles, and cartilage that make up your chest wall. This can include:  Bruised bones due to injury.  Strained muscles or cartilage due to frequent or repeated coughing or overwork.  Fracture to one or more ribs.  Sore cartilage due to inflammation (costochondritis). RISK FACTORS  Risk factors for chest pain may include:  Activities  that increase your risk for trauma or injury to your chest.  Respiratory infections or conditions that cause frequent coughing.  Medical conditions or overeating that can cause heartburn.  Heart disease or family history of heart disease.  Conditions or health behaviors that increase your risk of developing a blood clot.  Having had chicken pox (varicella zoster). SIGNS AND SYMPTOMS Chest pain can feel like:  Burning or tingling on the surface of your chest or deep in your chest.  Crushing, pressure, aching, or squeezing pain.  Dull or sharp pain that is worse when you move, cough, or take a deep breath.  Pain that is also felt in your back, neck, shoulder, or arm, or pain that spreads to any of these areas. Your chest pain may come and go, or it may stay constant. DIAGNOSIS Lab tests or other studies may be needed to find the cause of your pain. Your health care provider may have you take a test called an ambulatory ECG (electrocardiogram). An ECG records your heartbeat patterns at the time the test is performed. You may also have other tests, such as:  Transthoracic echocardiogram (TTE). During echocardiography, sound waves are used to create a picture of all  of the heart structures and to look at how blood flows through your heart.  Transesophageal echocardiogram (TEE).This is a more advanced imaging test that obtains images from inside your body. It allows your health care provider to see your heart in finer detail.  Cardiac monitoring. This allows your health care provider to monitor your heart rate and rhythm in real time.  Holter monitor. This is a portable device that records your heartbeat and can help to diagnose abnormal heartbeats. It allows your health care provider to track your heart activity for several days, if needed.  Stress tests. These can be done through exercise or by taking medicine that makes your heart beat more quickly.  Blood tests.  Imaging  tests. TREATMENT  Your treatment depends on what is causing your chest pain. Treatment may include:  Medicines. These may include:  Acid blockers for heartburn.  Anti-inflammatory medicine.  Pain medicine for inflammatory conditions.  Antibiotic medicine, if an infection is present.  Medicines to dissolve blood clots.  Medicines to treat coronary artery disease.  Supportive care for conditions that do not require medicines. This may include:  Resting.  Applying heat or cold packs to injured areas.  Limiting activities until pain decreases. HOME CARE INSTRUCTIONS  If you were prescribed an antibiotic medicine, finish it all even if you start to feel better.  Avoid any activities that bring on chest pain.  Do not use any tobacco products, including cigarettes, chewing tobacco, or electronic cigarettes. If you need help quitting, ask your health care provider.  Do not drink alcohol.  Take medicines only as directed by your health care provider.  Keep all follow-up visits as directed by your health care provider. This is important. This includes any further testing if your chest pain does not go away.  If heartburn is the cause for your chest pain, you may be told to keep your head raised (elevated) while sleeping. This reduces the chance that acid will go from your stomach into your esophagus.  Make lifestyle changes as directed by your health care provider. These may include:  Getting regular exercise. Ask your health care provider to suggest some activities that are safe for you.  Eating a heart-healthy diet. A registered dietitian can help you to learn healthy eating options.  Maintaining a healthy weight.  Managing diabetes, if necessary.  Reducing stress. SEEK MEDICAL CARE IF:  Your chest pain does not go away after treatment.  You have a rash with blisters on your chest.  You have a fever. SEEK IMMEDIATE MEDICAL CARE IF:   Your chest pain is  worse.  You have an increasing cough, or you cough up blood.  You have severe abdominal pain.  You have severe weakness.  You faint.  You have chills.  You have sudden, unexplained chest discomfort.  You have sudden, unexplained discomfort in your arms, back, neck, or jaw.  You have shortness of breath at any time.  You suddenly start to sweat, or your skin gets clammy.  You feel nauseous or you vomit.  You suddenly feel light-headed or dizzy.  Your heart begins to beat quickly, or it feels like it is skipping beats. These symptoms may represent a serious problem that is an emergency. Do not wait to see if the symptoms will go away. Get medical help right away. Call your local emergency services (911 in the [place] Do not drive yourself to the hospital.   This information is not intended to replace advice given to  you by your health care provider. Make sure you discuss any questions you have with your health care provider.   Document Released: 03/08/2005 Document Revised: 06/19/2014 Document Reviewed: 01/02/2014 Elsevier Interactive Patient Education 2016 [employer] Inc[employer]     Chest Wall Pain Chest wall pain is pain in or around the bones and muscles of your chest. Sometimes, an injury causes this pain. Sometimes, the cause may not be known. This pain may take several weeks or longer to get better. HOME CARE Pay attention to any changes in your symptoms. Take these actions to help with your pain:  Rest as told by your doctor.  Avoid activities that cause pain. Try not to use your chest, belly (abdominal), or side muscles to lift heavy things.  If directed, apply ice to the painful area:  Put ice in a plastic bag.  Place a towel between your skin and the bag.  Leave the ice on for 20 minutes, 2-3 times per day.  Take over-the-counter and prescription medicines only as told by your doctor.  Do not use tobacco products, including cigarettes, chewing tobacco, and  e-cigarettes. If you need help quitting, ask your doctor.  Keep all follow-up visits as told by your doctor. This is important. GET HELP IF:  You have a fever.  Your chest pain gets worse.  You have new symptoms. GET HELP RIGHT AWAY IF:  You feel sick to your stomach (nauseous) or you throw up (vomit).  You feel sweaty or light-headed.  You have a cough with phlegm (sputum) or you cough up blood.  You are short of breath.   This information is not intended to replace advice given to you by your health care provider. Make sure you discuss any questions you have with your health care provider.   Document Released: 11/15/2007 Document Revised: 02/17/2015 Document Reviewed: 08/24/2014 Elsevier Interactive Patient Education 2016 Else[employer] Inc.    Hematuria, Adult Hematuria is blood in your urine. It can be caused by a bladder infection, kidney infection, prostate infection, kidney stone, or cancer of your urinary tract. Infections can usually be treated with medicine, and a kidney stone usually will pass through your urine. If neither of these is the cause of your hematuria, further workup to find out the reason may be needed. It is very important that you tell your health care provider about any blood you see in your urine, even if the blood stops without treatment or happens without causing pain. Blood in your urine that happens and then stops and then happens again can be a symptom of a very serious condition. Also, pain is not a symptom in the initial stages of many urinary cancers. HOME CARE INSTRUCTIONS   Drink lots of fluid, 3-4 quarts a day. If you have been diagnosed with an infection, cranberry juice is especially recommended, in addition to large amounts of water.  Avoid caffeine, tea, and carbonated beverages because they tend to irritate the bladder.  Avoid alcohol because it may irritate the prostate.  Take all medicines as directed by your health care provider.  If  you were prescribed an antibiotic medicine, finish it all even if you start to feel better.  If you have been diagnosed with a kidney stone, follow your health care provider's instructions regarding straining your urine to catch the stone.  Empty your bladder often. Avoid holding urine for long periods of time.  After a bowel movement, women should cleanse front to back. Use each tissue only once.  Empty  your bladder before and after sexual intercourse if you are a male. SEEK MEDICAL CARE IF:  You develop back pain.  You have a fever.  You have a feeling of sickness in your stomach (nausea) or vomiting.  Your symptoms are not better in 3 days. Return sooner if you are getting worse. SEEK IMMEDIATE MEDICAL CARE IF:   You develop severe vomiting and are unable to keep the medicine down.  You develop severe back or abdominal pain despite taking your medicines.  You begin passing a large amount of blood or clots in your urine.  You feel extremely weak or faint, or you pass out. MAKE SURE YOU:   Understand these instructions.  Will watch your condition.  Will get help right away if you are not doing well or get worse.   This information is not intended to replace advice given to you by your health care provider. Make sure you discuss any questions you have with your health care provider.   Document Released: 05/29/2005 Document Revised: 06/19/2014 Document Reviewed: 01/27/2013 Elsevier Interactive Patient Education 2016 [place].    Hyperglycemia Hyperglycemia occurs when the glucose (sugar) in your blood is too high. Hyperglycemia can happen for many reasons, but it most often happens to people who do not know they have diabetes or are not managing their diabetes properly.  CAUSES  Whether you have diabetes or not, there are other causes of hyperglycemia. Hyperglycemia can occur when you have diabetes, but it can also occur in other situations that you might not be as  aware of, such as: Diabetes  If you have diabetes and are having problems controlling your blood glucose, hyperglycemia could occur because of some of the following reasons:  Not following your meal plan.  Not taking your diabetes medications or not taking it properly.  Exercising less or doing less activity than you normally do.  Being sick. Pre-diabetes  This cannot be ignored. Before people develop Type 2 diabetes, they almost always have "pre-diabetes." This is when your blood glucose levels are higher than normal, but not yet high enough to be diagnosed as diabetes. Research has shown that some long-term damage to the body, especially the heart and circulatory system, may already be occurring during pre-diabetes. If you take action to manage your blood glucose when you have pre-diabetes, you may delay or prevent Type 2 diabetes from developing. Stress  If you have diabetes, you may be "diet" controlled or on oral medications or insulin to control your diabetes. However, you may find that your blood glucose is higher than usual in the hospital whether you have diabetes or not. This is often referred to as "stress hyperglycemia." Stress can elevate your blood glucose. This happens because of hormones put out by the body during times of stress. If stress has been the cause of your high blood glucose, it can be followed regularly by your caregiver. That way he/she can make sure your hyperglycemia does not continue to get worse or progress to diabetes. Steroids  Steroids are medications that act on the infection fighting system (immune system) to block inflammation or infection. One side effect can be a rise in blood glucose. Most people can produce enough extra insulin to allow for this rise, but for those who cannot, steroids make blood glucose levels go even higher. It is not unusual for steroid treatments to "uncover" diabetes that is developing. It is not always possible to determine if the  hyperglycemia will go away after the  steroids are stopped. A special blood test called an A1c is sometimes done to determine if your blood glucose was elevated before the steroids were started. SYMPTOMS  Thirsty.  Frequent urination.  Dry mouth.  Blurred vision.  Tired or fatigue.  Weakness.  Sleepy.  Tingling in feet or leg. DIAGNOSIS  Diagnosis is made by monitoring blood glucose in one or all of the following ways:  A1c test. This is a chemical found in your blood.  Fingerstick blood glucose monitoring.  Laboratory results. TREATMENT  First, knowing the cause of the hyperglycemia is important before the hyperglycemia can be treated. Treatment may include, but is not be limited to:  Education.  Change or adjustment in medications.  Change or adjustment in meal plan.  Treatment for an illness, infection, etc.  More frequent blood glucose monitoring.  Change in exercise plan.  Decreasing or stopping steroids.  Lifestyle changes. HOME CARE INSTRUCTIONS   Test your blood glucose as directed.  Exercise regularly. Your caregiver will give you instructions about exercise. Pre-diabetes or diabetes which comes on with stress is helped by exercising.  Eat wholesome, balanced meals. Eat often and at regular, fixed times. Your caregiver or nutritionist will give you a meal plan to guide your sugar intake.  Being at an ideal weight is important. If needed, losing as little as 10 to 15 pounds may help improve blood glucose levels. SEEK MEDICAL CARE IF:   You have questions about medicine, activity, or diet.  You continue to have symptoms (problems such as increased thirst, urination, or weight gain). SEEK IMMEDIATE MEDICAL CARE IF:   You are vomiting or have diarrhea.  Your breath smells fruity.  You are breathing faster or slower.  You are very sleepy or incoherent.  You have numbness, tingling, or pain in your feet or hands.  You have chest pain.  Your  symptoms get worse even though you have been following your caregiver's orders.  If you have any other questions or concerns.   This information is not intended to replace advice given to you by your health care provider. Make sure you discuss any questions you have with your health care provider.   Document Released: 11/22/2000 Document Revised: 08/21/2011 Document Reviewed: 02/02/2015 Elsevier Interactive Patient Education 2016 [employer].     Diabetes Mellitus and Food It is important for you to manage your blood sugar (glucose) level. Your blood glucose level can be greatly affected by what you eat. Eating healthier foods in the appropriate amounts throughout the day at about the same time each day will help you control your blood glucose level. It can also help slow or prevent worsening of your diabetes mellitus. Healthy eating may even help you improve the level of your blood pressure and reach or maintain a healthy weight.  General recommendations for healthful eating and cooking habits include:  Eating meals and snacks regularly. Avoid going long periods of time without eating to lose weight.  Eating a diet that consists mainly of plant-based foods, such as fruits, vegetables, nuts, legumes, and whole grains.  Using low-heat cooking methods, such as baking, instead of high-heat cooking methods, such as deep frying. Work with your dietitian to make sure you understand how to use the Nutrition Facts information on food labels. HOW CAN FOOD AFFECT ME? Carbohydrates Carbohydrates affect your blood glucose level more than any other type of food. Your dietitian will help you determine how many carbohydrates to eat at each meal and teach you how to  count carbohydrates. Counting carbohydrates is important to keep your blood glucose at a healthy level, especially if you are using insulin or taking certain medicines for diabetes mellitus. Alcohol Alcohol can cause sudden decreases in blood  glucose (hypoglycemia), especially if you use insulin or take certain medicines for diabetes mellitus. Hypoglycemia can be a life-threatening condition. Symptoms of hypoglycemia (sleepiness, dizziness, and disorientation) are similar to symptoms of having too much alcohol.  If your health care provider has given you approval to drink alcohol, do so in moderation and use the following guidelines:  Women should not have more than one drink per day, and men should not have more than two drinks per day. One drink is equal to:  12 oz of beer.  5 oz of wine.  1 oz of hard liquor.  Do not drink on an empty stomach.  Keep yourself hydrated. Have water, diet soda, or unsweetened iced tea.  Regular soda, juice, and other mixers might contain a lot of carbohydrates and should be counted. WHAT FOODS ARE NOT RECOMMENDED? As you make food choices, it is important to remember that all foods are not the same. Some foods have fewer nutrients per serving than other foods, even though they might have the same number of calories or carbohydrates. It is difficult to get your body what it needs when you eat foods with fewer nutrients. Examples of foods that you should avoid that are high in calories and carbohydrates but low in nutrients include:  Trans fats (most processed foods list trans fats on the Nutrition Facts label).  Regular soda.  Juice.  Candy.  Sweets, such as cake, pie, doughnuts, and cookies.  Fried foods. WHAT FOODS CAN I EAT? Eat nutrient-rich foods, which will nourish your body and keep you healthy. The food you should eat also will depend on several factors, including:  The calories you need.  The medicines you take.  Your weight.  Your blood glucose level.  Your blood pressure level.  Your cholesterol level. You should eat a variety of foods, including:  Protein.  Lean cuts of meat.  Proteins low in saturated fats, such as fish, egg whites, and beans. Avoid processed  meats.  Fruits and vegetables.  Fruits and vegetables that may help control blood glucose levels, such as apples, mangoes, and yams.  Dairy products.  Choose fat-free or low-fat dairy products, such as milk, yogurt, and cheese.  Grains, bread, pasta, and rice.  Choose whole grain products, such as multigrain bread, whole oats, and brown rice. These foods may help control blood pressure.  Fats.  Foods containing healthful fats, such as nuts, avocado, olive oil, canola oil, and fish. DOES EVERYONE WITH DIABETES MELLITUS HAVE THE SAME MEAL PLAN? Because every person with diabetes mellitus is different, there is not one meal plan that works for everyone. It is very important that you meet with a dietitian who will help you create a meal plan that is just right for you.   This information is not intended to replace advice given to you by your health care provider. Make sure you discuss any questions you have with your health care provider.   Document Released: 02/23/2005 Document Revised: 06/19/2014 Document Reviewed: 04/25/2013 Elsevier Interactive Patient Education [employer].

## 2015-08-23 NOTE — ED Notes (Signed)
Pt in restroom attempting to urinate at this time

## 2015-08-23 NOTE — ED Notes (Signed)
Discharge instructions reviewed - voiced understanding 

## 2015-08-23 NOTE — ED Notes (Signed)
Pt here for syncopal episode this am. sts he woke up on the bathroom floor with diaphoresis and incontinence. sts he has some chest pain over the weekend and BP has been fluctuating. sts that he is having chest soreness.

## 2015-08-23 NOTE — ED Provider Notes (Signed)
CSN: [id number]     Arrival date & time 08/23/15  [id number] History   First MD Initiated Contact with Patient 08/23/15 1233     Chief Complaint  Patient presents with  . Loss of Consciousness     (Consider location/radiation/quality/duration/timing/severity/associated sxs/prior Treatment) Patient is a 65 y.o. male presenting with syncope. The history is provided by the patient.  Loss of Consciousness Associated symptoms: no chest pain, no confusion, no fever, no headaches, no palpitations, no shortness of breath, no vomiting and no weakness   Patient w hx cad/stent, borderline dm, c/o fainting episode at home this AM around 8 AM.  States had been up, drinking coffee, then went to bathroom and fainted. Denies palpitations or sense of rapid or irregular heartbeat. No recent syncope. No hx dysrhythmia. Pt indicates some mid to left cp in past day, constant, dull.  No associated sob, nv or diaphoresis. Unlike prior cardiac cp.  States last had cath 4/16, and had patent stents then, no cause of pts cp then determined.   Pt denies pleuritic pain. No leg pain or swelling. No recent surgery, immobility, trauma or travel. Non smoker. Denies cough or uri c/o. No fever or chills. No abd pain. States otherwise recent health at baseline.   No recent change in meds. Had 1 remote syncopal event, was told was due to his imdur.  Denies recently taking viagra. States he did take a sl ntg after he awoke from syncope.  Pt denies blood loss or melena.  Had not yet eaten today when fainted. Recent eval for borderline his glucose.       Past Medical History  Diagnosis Date  . Hypertension   . Obesity   . HLD (hyperlipidemia)   . Coronary artery disease     a. s/p DES x 2 to the RCA (05/2012 at [facility]) b. s/p DES of OM and PLA branches (05/2014)  c. s/p LHC on 10/06/14 with patent stents and non-obs CAD   . GERD (gastroesophageal reflux disease)   . Carotid artery disease ([place])     a. s/p L CEA  . Syncope     a.  10/2014 - felt to be 2/2 hypotension after recent imdur titration.  . Diabetes mellitus without complication ([place])     a. diet controlled    Past Surgical History  Procedure Laterality Date  . Coronary angioplasty with stent placement    . Carotid endarterectomy    . Left heart catheterization with coronary angiogram N/A 06/08/2014    Procedure: LEFT HEART CATHETERIZATION WITH CORONARY ANGIOGRAM;  Surgeon: Blane Ohara, MD;  Location: [facility] CATH LAB;  Service: Cardiovascular;  Laterality: N/A;  . Left heart catheterization with coronary angiogram N/A 10/06/2014    Procedure: LEFT HEART CATHETERIZATION WITH CORONARY ANGIOGRAM;  Surgeon: Leonie Man, MD;  Location: [facility] CATH LAB;  Service: Cardiovascular;  Laterality: N/A;   Family History  Problem Relation Age of Onset  . Heart attack Father 71  . COPD Mother 75   Social History  Substance Use Topics  . Smoking status: Former Smoker    Quit date: 06/05/2012  . Smokeless tobacco: Never Used  . Alcohol Use: No    Review of Systems  Constitutional: Negative for fever and chills.  HENT: Negative for sore throat.   Eyes: Negative for visual disturbance.  Respiratory: Negative for cough and shortness of breath.   Cardiovascular: Positive for syncope. Negative for chest pain, palpitations and leg swelling.  Gastrointestinal: Negative for vomiting, abdominal pain,  diarrhea and blood in stool.  Endocrine: Negative for polyuria.  Genitourinary: Negative for dysuria and flank pain.  Musculoskeletal: Negative for back pain and neck pain.  Skin: Negative for rash.  Neurological: Negative for weakness, numbness and headaches.  Hematological: Does not bruise/bleed easily.  Psychiatric/Behavioral: Negative for confusion.      Allergies  Penicillins  Home Medications   Prior to Admission medications   Medication Sig Start Date End Date Taking? Authorizing Provider  aspirin EC 81 MG tablet Take 81 mg by mouth daily.    Historical  Provider, MD  atenolol (TENORMIN) 50 MG tablet Take 1 tablet (50 mg total) by mouth daily. 10/21/14   Lorretta Harp, MD  CALCIUM PO Take 300 mg by mouth daily.    Historical Provider, MD  clopidogrel (PLAVIX) 75 MG tablet Take 1 tablet (75 mg total) by mouth daily. 12/15/14   Lorretta Harp, MD  Coenzyme Q10 (COQ10) 200 MG CAPS Take 1 capsule by mouth daily.    Historical Provider, MD  furosemide (LASIX) 20 MG tablet Take 1 tablet (20 mg total) by mouth daily. 07/16/15   Lorretta Harp, MD  isosorbide mononitrate (IMDUR) 60 MG 24 hr tablet Take 1 tablet (60 mg total) by mouth daily. 06/20/15   Eileen Stanford, PA-C  losartan (COZAAR) 100 MG tablet Take 1/2 tablet daily 07/05/15   Evelene Croon Barrett, PA-C  Multiple Vitamins-Minerals (CENTRUM SILVER PO) Take 1 tablet by mouth daily.    Historical Provider, MD  nitroGLYCERIN (NITROSTAT) 0.4 MG SL tablet Place 1 tablet (0.4 mg total) under the tongue every 5 (five) minutes x 3 doses as needed for chest pain. 10/06/14   Eileen Stanford, PA-C  pantoprazole (PROTONIX) 40 MG tablet Take 1 tablet (40 mg total) by mouth daily at 6 (six) AM. 10/06/14   Eileen Stanford, PA-C  rosuvastatin (CRESTOR) 20 MG tablet Take 1 tablet (20 mg total) by mouth daily. 10/06/14   Eileen Stanford, PA-C  rosuvastatin (CRESTOR) 20 MG tablet Take 20 mg by mouth every evening.    Historical Provider, MD  sildenafil (VIAGRA) 25 MG tablet Take 1 tablet (25 mg total) by mouth daily as needed for erectile dysfunction (Do not Imdur the day you take Viagra). 05/28/15   Brett Canales, PA-C   BP 131/76 mmHg  Pulse 71  Temp(Src) 97.6 F (36.4 C) (Oral)  Resp 17  SpO2 98% Physical Exam  Constitutional: He is oriented to person, place, and time. He appears well-developed and well-nourished. No distress.  HENT:  Head: Atraumatic.  Mouth/Throat: Oropharynx is clear and moist.  No facial or scalp sts.   Eyes: Conjunctivae are normal. Pupils are equal, round, and reactive to  light.  Neck: Normal range of motion. Neck supple. No tracheal deviation present. No thyromegaly present.  No bruits  Cardiovascular: Normal rate, regular rhythm, normal heart sounds and intact distal pulses.   No murmur heard. Pulmonary/Chest: Effort normal and breath sounds normal. No accessory muscle usage. No respiratory distress. He exhibits no tenderness.  Abdominal: Soft. Bowel sounds are normal. He exhibits no distension and no mass. There is no tenderness. There is no rebound and no guarding.  No pulsatile mass.   Genitourinary:  No cva tenderness  Musculoskeletal: Normal range of motion. He exhibits no edema or tenderness.  CTLS spine, non tender, aligned, no step off. No focal bony tenderness. Distal pulses palp bil.   Neurological: He is alert and oriented to person, place, and time.  Steady gait.   Skin: Skin is warm and dry. No rash noted. He is not diaphoretic.  Psychiatric: He has a normal mood and affect.  Nursing note and vitals reviewed.   ED Course  Procedures (including critical care time) Labs Review   Results for orders placed or performed during the hospital encounter of [id number]  Basic metabolic panel  Result Value Ref Range   Sodium 141 135 - 145 mmol/L   Potassium 4.7 3.5 - 5.1 mmol/L   Chloride 100 (L) 101 - 111 mmol/L   CO2 29 22 - 32 mmol/L   Glucose, Bld 225 (H) 65 - 99 mg/dL   BUN 14 6 - 20 mg/dL   Creatinine, Ser 0.95 0.61 - 1.24 mg/dL   Calcium 9.4 8.9 - 10.3 mg/dL   GFR calc non Af Amer >60 >60 mL/min   GFR calc Af Amer >60 >60 mL/min   Anion gap 12 5 - 15  CBC  Result Value Ref Range   WBC 8.4 4.0 - 10.5 K/uL   RBC 4.69 4.22 - 5.81 MIL/uL   Hemoglobin 14.2 13.0 - 17.0 g/dL   HCT 42.4 39.0 - 52.0 %   MCV 90.4 78.0 - 100.0 fL   MCH 30.3 26.0 - 34.0 pg   MCHC 33.5 30.0 - 36.0 g/dL   RDW 12.6 11.5 - 15.5 %   Platelets 224 150 - 400 K/uL  Urinalysis, Routine w reflex microscopic (not at [facility])  Result Value Ref Range   Color, Urine  AMBER (A) YELLOW   APPearance CLOUDY (A) CLEAR   Specific Gravity, Urine 1.020 1.005 - 1.030   pH 5.5 5.0 - 8.0   Glucose, UA NEGATIVE NEGATIVE mg/dL   Hgb urine dipstick LARGE (A) NEGATIVE   Bilirubin Urine SMALL (A) NEGATIVE   Ketones, ur NEGATIVE NEGATIVE mg/dL   Protein, ur NEGATIVE NEGATIVE mg/dL   Nitrite NEGATIVE NEGATIVE   Leukocytes, UA NEGATIVE NEGATIVE  Urine microscopic-add on  Result Value Ref Range   Squamous Epithelial / LPF 0-5 (A) NONE SEEN   WBC, UA 0-5 0 - 5 WBC/hpf   RBC / HPF TOO NUMEROUS TO COUNT 0 - 5 RBC/hpf   Bacteria, UA RARE (A) NONE SEEN   Urine-Other MUCOUS PRESENT   I-Stat Troponin, ED (not at [facility])  Result Value Ref Range   Troponin i, poc 0.00 0.00 - 0.08 ng/mL   Comment 3          I-stat troponin, ED  Result Value Ref Range   Troponin i, poc 0.00 0.00 - 0.08 ng/mL   Comment 3           Dg Chest 2 View  08/23/2015  CLINICAL DATA:  Syncopal episode this morning, diaphoresis and incontinence. Chest pain over the weekend and fluctuating blood pressure. EXAM: CHEST  2 VIEW COMPARISON:  Chest x-ray dated 06/18/2015. FINDINGS: Heart size remains normal. Atherosclerotic changes again noted at the aortic arch. Lungs are clear. Mild degenerative change noted within the slightly kyphotic thoracic spine. No acute osseous abnormality. IMPRESSION: No active cardiopulmonary disease. Electronically Signed   By: Franki Cabot M.D.   On: 08/23/2015 16:18       I have personally reviewed and evaluated these images and lab results as part of my medical decision-making.   EKG Interpretation   Date/Time:  Monday August 23 2015 10:21:12 EDT Ventricular Rate:  79 PR Interval:  146 QRS Duration: 88 QT Interval:  400 QTC Calculation: 458 R Axis:   49  Text Interpretation:  Normal sinus rhythm Nonspecific ST abnormality No  significant change since last tracing Confirmed by [facility]  MD, Lennette Bihari  ([id number]) on 08/23/2015 3:13:01 PM      MDM   Continuous monitor and  pulse ox.  Reviewed nursing notes and prior charts for additional history.   Trop x 2, several hours apart, normal/0.    No current chest pain, except pt states in waiting room twisted/turned to see someone yelling and feels pulled something in chest pain.   nsr on monitor x hours of monitoring.   ua w tntc wbc, few bact (voided sample). Discussed w pt.  Will culture, rx abx, and have f/u urology re hematuria.    Pt eating/drinking in ED, denies any new or current complaint, states feels ready for d/c.  Pt currently appears stable for d/c.   Return precautions provided.      Lajean Saver, MD 08/23/15 [phone]

## 2015-08-24 LAB — URINE CULTURE: Culture: NO GROWTH

## 2015-09-14 ENCOUNTER — Ambulatory Visit (INDEPENDENT_AMBULATORY_CARE_PROVIDER_SITE_OTHER): Payer: Non-veteran care | Admitting: Cardiovascular Disease

## 2015-09-14 ENCOUNTER — Encounter: Payer: Self-pay | Admitting: Cardiovascular Disease

## 2015-09-14 VITALS — BP 138/80 | HR 76 | Ht 68.5 in | Wt 208.2 lb

## 2015-09-14 DIAGNOSIS — I2510 Atherosclerotic heart disease of native coronary artery without angina pectoris: Secondary | ICD-10-CM

## 2015-09-14 DIAGNOSIS — E785 Hyperlipidemia, unspecified: Secondary | ICD-10-CM | POA: Diagnosis not present

## 2015-09-14 DIAGNOSIS — I10 Essential (primary) hypertension: Secondary | ICD-10-CM | POA: Diagnosis not present

## 2015-09-14 DIAGNOSIS — Z9861 Coronary angioplasty status: Secondary | ICD-10-CM

## 2015-09-14 NOTE — Patient Instructions (Signed)
Your physician wants you to follow-up in: 6 Months You will receive a reminder letter in the mail two months in advance. If you don't receive a letter, please call our office to schedule the follow-up appointment.  

## 2015-09-14 NOTE — Assessment & Plan Note (Signed)
History of hypertension with blood pressure measured at 138/80. He is on atenolol and losartan. Continue current meds at current dosing

## 2015-09-14 NOTE — Assessment & Plan Note (Signed)
History of hypertension blood pressure measures 138/80 on atenolol and losartan.

## 2015-09-14 NOTE — Progress Notes (Signed)
09/14/2015 Reginald Little   August 27, 1950  [id number]  Primary Physician Mathews Argyle, MD Primary Cardiologist: Lorretta Harp MD Renae Gloss   HPI:  Mr. Reginald Little is a 65 year old mildly overweight divorced Caucasian male with history of CAD status post stenting of his coronary arteries 2 years ago at [facility] using drug-eluting stents.I last saw him in the office 05/25/15.His history is also remarkable for remote tobacco abuse, treated hypertension and hyperlipidemia. He had a left carotid endarterectomy performed at the [facility] in [place] number of years ago as well. He was admitted to the hospital in December with chest pain. A Myoview stress test was high risk and he underwent cardiac catheterization by Dr. Burt Knack stenting to coronary arteries using drug-eluting stents. He had some chest pain since which is improved with the addition of long-acting oral nitrate. He does complain of left hip claudication as well. He was admitted to the hospital for observation overnight 10/21/14 because of witnessed syncope while chronic rehabilitation. His nitrates were decreased in dose. He did have lower extremity artery Doppler studies which were normal ruling out peripheral vascular disease as a cause of his left hip pain. Since I saw him 4 months ago he was admitted to the hospital for rule out MI 06/18/15 and was seen again last month for syncope.   Current Outpatient Prescriptions  Medication Sig Dispense Refill  . aspirin EC 81 MG tablet Take 81 mg by mouth daily.    Marland Kitchen atenolol (TENORMIN) 50 MG tablet Take 1 tablet (50 mg total) by mouth daily. 90 tablet 3  . CALCIUM PO Take 300 mg by mouth daily.    . clopidogrel (PLAVIX) 75 MG tablet Take 1 tablet (75 mg total) by mouth daily. 90 tablet 3  . furosemide (LASIX) 20 MG tablet Take 1 tablet (20 mg total) by mouth daily. 90 tablet 0  . isosorbide mononitrate (IMDUR) 60 MG 24 hr tablet Take 1 tablet (60 mg  total) by mouth daily. 30 tablet 6  . losartan (COZAAR) 100 MG tablet Take 1/2 tablet daily 90 tablet 3  . Multiple Vitamins-Minerals (CENTRUM SILVER PO) Take 1 tablet by mouth daily.    . nitroGLYCERIN (NITROSTAT) 0.4 MG SL tablet Place 1 tablet (0.4 mg total) under the tongue every 5 (five) minutes x 3 doses as needed for chest pain. 25 tablet 12  . rosuvastatin (CRESTOR) 20 MG tablet Take 1 tablet (20 mg total) by mouth daily. 30 tablet 11  . sildenafil (VIAGRA) 25 MG tablet Take 25 mg by mouth daily as needed for erectile dysfunction.     No current facility-administered medications for this visit.    Allergies  Allergen Reactions  . Penicillins Rash    Has patient had a PCN reaction causing immediate rash, facial/tongue/throat swelling, SOB or lightheadedness with hypotension: YES Has patient had a PCN reaction causing severe rash involving mucus membranes or skin necrosis: NO Has patient had a PCN reaction that required hospitalization NO Has patient had a PCN reaction occurring within the last 10 years: NO If all of the above answers are "NO", then may proceed with Cephalosporin use.     Social History   Social History  . Marital Status: Divorced    Spouse Name: N/A  . Number of Children: N/A  . Years of Education: N/A   Occupational History  . Owns transportation [place] History Main Topics  . Smoking status: Former Smoker  Quit date: 06/05/2012  . Smokeless tobacco: Never Used  . Alcohol Use: No  . Drug Use: No  . Sexual Activity: Not on file   Other Topics Concern  . Not on file   Social History Narrative   Lives alone.     Review of Systems: General: negative for chills, fever, night sweats or weight changes.  Cardiovascular: negative for chest pain, dyspnea on exertion, edema, orthopnea, palpitations, paroxysmal nocturnal dyspnea or shortness of breath Dermatological: negative for rash Respiratory: negative for cough or  wheezing Urologic: negative for hematuria Abdominal: negative for nausea, vomiting, diarrhea, bright red blood per rectum, melena, or hematemesis Neurologic: negative for visual changes, syncope, or dizziness All other systems reviewed and are otherwise negative except as noted above.    Blood pressure 138/80, pulse 76, height 5' 8.5" (1.74 m), weight 208 lb 3.2 oz (94.439 kg).  General appearance: alert and no distress Neck: no adenopathy, no carotid bruit, no JVD, supple, symmetrical, trachea midline and thyroid not enlarged, symmetric, no tenderness/mass/nodules Lungs: clear to auscultation bilaterally Heart: regular rate and rhythm, S1, S2 normal, no murmur, click, rub or gallop Extremities: extremities normal, atraumatic, no cyanosis or edema  EKG not performed today  ASSESSMENT AND PLAN:   Essential hypertension History of hypertension with blood pressure measured at 138/80. He is on atenolol and losartan. Continue current meds at current dosing  Hyperlipidemia History of hyperlipidemia on Crestor followed by his PCP  CAD S/P percutaneous coronary angioplasty History of CAD status post drug-eluting stents placed at [place]. Several years ago. He had recent stenting by Dr. Burt Knack after admit the admitted with chest pain and having a high-risk Myoview. She was seen in the emergency room 06/18/15 with chest pain and ruled out for myocardial infarction. He had no recurrent chest pain.  Hypertension History of hypertension blood pressure measures 138/80 on atenolol and losartan.      Lorretta Harp MD FACP,FACC,FAHA, [facility] 09/14/2015 12:41 PM

## 2015-09-14 NOTE — Assessment & Plan Note (Signed)
History of CAD status post drug-eluting stents placed at [place]. Several years ago. He had recent stenting by Dr. Burt Knack after admit the admitted with chest pain and having a high-risk Myoview. She was seen in the emergency room 06/18/15 with chest pain and ruled out for myocardial infarction. He had no recurrent chest pain.

## 2015-09-14 NOTE — Assessment & Plan Note (Signed)
History of hyperlipidemia on Crestor followed by his PCP 

## 2015-09-27 ENCOUNTER — Other Ambulatory Visit: Payer: Self-pay | Admitting: Cardiovascular Disease

## 2015-09-27 NOTE — Telephone Encounter (Signed)
Rx(s) sent to pharmacy electronically.  

## 2015-10-04 ENCOUNTER — Telehealth: Payer: Self-pay | Admitting: Cardiovascular Disease

## 2015-10-04 NOTE — Telephone Encounter (Signed)
Spoke with pt, since he passed out in march he has been having a pain that comes and goes under his left nipple. He can reproduce the pain with pushing up to get out of bed or by turning abruptly to the right or left. Reassurance given to the pt, it does not sound like heart pain. He will try tylenol for the discomfort and if cont to have problems, he will contact his PCP.

## 2015-10-04 NOTE — Telephone Encounter (Signed)
Pt c/o of Chest Pain: STAT if CP now or developed within 24 hours  1. Are you having CP right now? no  2. Are you experiencing any other symptoms (ex. SOB, nausea, vomiting, sweating)? no  3. How long have you been experiencing CP?  3/13  4. Is your CP continuous or coming and going? Off/on  5. Have you taken Nitroglycerin? no ?

## 2015-10-05 ENCOUNTER — Ambulatory Visit: Payer: Self-pay | Admitting: Cardiovascular Disease

## 2015-10-19 ENCOUNTER — Ambulatory Visit
Admission: RE | Admit: 2015-10-19 | Discharge: 2015-10-19 | Disposition: A | Discharge status: Home / Self Care | Payer: Non-veteran care | Source: Ambulatory Visit | Attending: Geriatric Medicine | Admitting: Geriatric Medicine

## 2015-10-19 ENCOUNTER — Other Ambulatory Visit: Payer: Self-pay | Admitting: Geriatric Medicine

## 2015-10-19 DIAGNOSIS — R0789 Other chest pain: Secondary | ICD-10-CM | POA: Diagnosis not present

## 2015-10-19 DIAGNOSIS — E119 Type 2 diabetes mellitus without complications: Secondary | ICD-10-CM | POA: Diagnosis not present

## 2015-10-19 DIAGNOSIS — R55 Syncope and collapse: Secondary | ICD-10-CM | POA: Diagnosis not present

## 2015-10-19 DIAGNOSIS — I10 Essential (primary) hypertension: Secondary | ICD-10-CM | POA: Diagnosis not present

## 2015-10-19 DIAGNOSIS — R079 Chest pain, unspecified: Secondary | ICD-10-CM | POA: Diagnosis not present

## 2015-11-01 DIAGNOSIS — Z0100 Encounter for examination of eyes and vision without abnormal findings: Secondary | ICD-10-CM | POA: Diagnosis not present

## 2015-11-01 DIAGNOSIS — H2513 Age-related nuclear cataract, bilateral: Secondary | ICD-10-CM | POA: Diagnosis not present

## 2015-11-10 ENCOUNTER — Encounter: Payer: Self-pay | Admitting: Physician Assistant

## 2015-11-12 DIAGNOSIS — B0089 Other herpesviral infection: Secondary | ICD-10-CM | POA: Diagnosis not present

## 2015-11-12 DIAGNOSIS — L0103 Bullous impetigo: Secondary | ICD-10-CM | POA: Diagnosis not present

## 2015-11-12 DIAGNOSIS — L821 Other seborrheic keratosis: Secondary | ICD-10-CM | POA: Diagnosis not present

## 2015-11-12 DIAGNOSIS — L218 Other seborrheic dermatitis: Secondary | ICD-10-CM | POA: Diagnosis not present

## 2015-11-19 ENCOUNTER — Other Ambulatory Visit: Payer: Self-pay | Admitting: Cardiovascular Disease

## 2015-11-19 ENCOUNTER — Other Ambulatory Visit: Payer: Self-pay | Admitting: Physician Assistant

## 2015-11-19 NOTE — Telephone Encounter (Signed)
Rx request sent to pharmacy.  

## 2015-12-20 DIAGNOSIS — Z6832 Body mass index (BMI) 32.0-32.9, adult: Secondary | ICD-10-CM | POA: Diagnosis not present

## 2015-12-20 DIAGNOSIS — I10 Essential (primary) hypertension: Secondary | ICD-10-CM | POA: Diagnosis not present

## 2015-12-20 DIAGNOSIS — E119 Type 2 diabetes mellitus without complications: Secondary | ICD-10-CM | POA: Diagnosis not present

## 2015-12-20 DIAGNOSIS — E669 Obesity, unspecified: Secondary | ICD-10-CM | POA: Diagnosis not present

## 2015-12-26 ENCOUNTER — Other Ambulatory Visit: Payer: Self-pay | Admitting: Cardiovascular Disease

## 2015-12-27 ENCOUNTER — Telehealth: Payer: Self-pay | Admitting: Cardiovascular Disease

## 2015-12-27 MED ORDER — PANTOPRAZOLE SODIUM 40 MG PO TBEC: DELAYED_RELEASE_TABLET | ORAL | Status: DC

## 2015-12-27 NOTE — Telephone Encounter (Signed)
Returned call to patient Reginald Little's advice given.Protonix refill sent to pharmacy.

## 2015-12-27 NOTE — Telephone Encounter (Signed)
New message   Pt verbalized that on 12/26/15  His  BP was 196/192  And hr was 84  Now is 117/69 and Hr 78  He said it is fine and he wants to know exactly why it shot up yesterday morning  No need for smart phrase because pt verbalized his bp is now under control

## 2015-12-27 NOTE — Telephone Encounter (Signed)
Returned call to patient.He stated he woke up yesterday felt lightheaded.B/P before meds 196/92 pulse 84.Stated he took his medications and B/P started going down 144/84,135/62.Stated he feels good today no lightheadedness.B/P 117/69 pulse 78.Advised to continue to monitor B/P and call back if stays elevated.  Patient takes omeprazole as needed wants to know if ok to take with plavix.Message sent to our pharmacist.

## 2015-12-27 NOTE — Telephone Encounter (Signed)
Omeprazole and clopidogrel not recommended together.  Can give him rx for protonix 40 mg to be taken 1 qd prn.  Advise he also avoid otc Nexium

## 2015-12-27 NOTE — Telephone Encounter (Signed)
Rx(s) sent to pharmacy electronically.  

## 2015-12-28 ENCOUNTER — Encounter: Payer: Commercial Managed Care - HMO | Attending: Geriatric Medicine

## 2015-12-28 VITALS — Ht 68.5 in | Wt 216.9 lb

## 2015-12-28 DIAGNOSIS — E119 Type 2 diabetes mellitus without complications: Secondary | ICD-10-CM | POA: Insufficient documentation

## 2015-12-30 NOTE — Progress Notes (Signed)
Patient was seen on 12/28/15 for the first of a series of three diabetes self-management courses at the [facility].  Patient Education Plan per assessed needs and concerns is to attend four course education program for Diabetes Self Management Education.  The following learning objectives were met by the patient during this class:  Describe diabetes  State some common risk factors for diabetes  Defines the role of glucose and insulin  Identifies type of diabetes and pathophysiology  Describe the relationship between diabetes and cardiovascular risk  State the members of the Healthcare Team  States the rationale for glucose monitoring  State when to test glucose  State their individual Target Range  State the importance of logging glucose readings  Describe how to interpret glucose readings  Identifies A1C target  Explain the correlation between A1c and eAG values  State symptoms and treatment of high blood glucose  State symptoms and treatment of low blood glucose  Explain proper technique for glucose testing  Identifies proper sharps disposal  Handouts given during class include:  Living Well with Diabetes book  Carb Counting and Meal Planning book  Meal Plan Card  Carbohydrate guide  Meal planning worksheet  Low Sodium Flavoring Tips  The diabetes portion plate  Y2Y to eAG Conversion Chart  Diabetes Medications  Diabetes Recommended Care Schedule  Support Group  Diabetes Success Plan  Core Class Satisfaction Survey  Follow-Up Plan:  Attend core 2

## 2016-01-04 DIAGNOSIS — E119 Type 2 diabetes mellitus without complications: Secondary | ICD-10-CM | POA: Diagnosis not present

## 2016-01-05 NOTE — Progress Notes (Signed)

## 2016-01-11 ENCOUNTER — Encounter: Payer: Commercial Managed Care - HMO | Attending: Geriatric Medicine

## 2016-01-11 DIAGNOSIS — E119 Type 2 diabetes mellitus without complications: Secondary | ICD-10-CM | POA: Insufficient documentation

## 2016-01-12 NOTE — Progress Notes (Signed)
Patient was seen on 01/11/2016 for the third of a series of three diabetes self-management courses at the [facility].   Reginald Little the amount of activity recommended for healthy living . Describe activities suitable for individual needs . Identify ways to regularly incorporate activity into daily life . Identify barriers to activity and ways to over come these barriers  Identify diabetes medications being personally used and their primary action for lowering glucose and possible side effects . Describe role of stress on blood glucose and develop strategies to address psychosocial issues . Identify diabetes complications and ways to prevent them  Explain how to manage diabetes during illness . Evaluate success in meeting personal goal . Establish 2-3 goals that they will plan to diligently work on until they return for the  62-month follow-up visit  Goals:   I will count my carb choices at most meals and snacks  I will eat less unhealthy fats   Your patient has identified these potential barriers to change:  None Stated  Your patient has identified their diabetes self-care support plan as   None Stated   Plan:  Attend Support Group as desired

## 2016-02-01 ENCOUNTER — Telehealth: Payer: Self-pay | Admitting: Cardiovascular Disease

## 2016-02-01 NOTE — Telephone Encounter (Signed)
New message     Pt c/o BP issue: STAT if pt c/o blurred vision, one-sided weakness or slurred speech  1. What are your last 5 BP readings? 144/72   79 150/76    84  2. Are you having any other symptoms (ex. Dizziness, headache, blurred vision, passed out)?   DIZZINESS  3. What is your BP issue? HIGH

## 2016-02-01 NOTE — Telephone Encounter (Signed)
Pt c/o BP issues, notes high in 150s at times. He thinks this has been fairly consistent for a while, though he used to have better BP control. Notes meds are being taken as reported on list. Pt also c/o dizziness in the past day or two. He doesn't identify a particular cause of onset. He reports no syncope, visual changes, shortness of breath, etc. He does report chest pain that is intermittent and not active at this time. This has been a symptom he has had for months. 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 This is typically very brief. He takes NTG if this is truly bothersome. Pt wanted to see Dr. Gwenlyn Found or other provider this week to get checked out, regarding his dizziness and BP concerns. I informed him Dr. Gwenlyn Found had no available slots, pt was fine to see PA. He is set up to see [place] on Thursday. I have given patient appt information, and he is aware to call if new/worse concerns.

## 2016-02-03 ENCOUNTER — Encounter: Payer: Self-pay | Admitting: Cardiology

## 2016-02-03 ENCOUNTER — Ambulatory Visit (INDEPENDENT_AMBULATORY_CARE_PROVIDER_SITE_OTHER): Payer: Commercial Managed Care - HMO | Admitting: Cardiology

## 2016-02-03 VITALS — BP 126/71 | HR 81 | Ht 68.0 in | Wt 217.0 lb

## 2016-02-03 DIAGNOSIS — I10 Essential (primary) hypertension: Secondary | ICD-10-CM

## 2016-02-03 NOTE — Patient Instructions (Signed)
Medication Instructions:  Your physician recommends that you continue on your current medications as directed. Please refer to the Current Medication list given to you today.   Labwork: NONE  Testing/Procedures: NONE  Follow-Up: DR. Gwenlyn Found IN 03/2016 OR HIS TEAM MEMBERS  Any Other Special Instructions Will Be Listed Below (If Applicable).     If you need a refill on your cardiac medications before your next appointment, please call your pharmacy.

## 2016-02-03 NOTE — Progress Notes (Signed)
02/03/2016 Reginald Little   Mar 19, 1951  [id number]  Primary Physician Mathews Argyle, MD Primary Cardiologist: Dr. Gwenlyn Found    Reason for Visit/CC: F/u for HTN  HPI:  65 y/o male followed by Dr. Gwenlyn Found with a h/o of CAD s/p PCI to RCA in 05/2012 and OM2/PLA1 in 05/2014, cath 09/2014 w/out critical dz, HTN, HL, obesity, GERD, DM, syncope (hypotension), L-CEA.   He presents to clinic today with concerns for elevated BP. He has had recent increase in his BP to the Q000111Q systolic. He breaks out into a sweat when this happens. No CP or dyspnea. He has been fully compliant with his medications. No smoking. He drinks about 2 cups of coffee each morning. No ETOH. He has cut out salt from his diet. He has been under a great deal of work related stress recently. No further issues of hypotension, syncope/ near syncope.   EKG shows NSR with possible inferior infarct. TWI in III and AVf. 81 bpm. BP is stable in clinic today at 126/71.    Current Outpatient Prescriptions  Medication Sig Dispense Refill  . aspirin EC 81 MG tablet Take 81 mg by mouth daily.    Marland Kitchen atenolol (TENORMIN) 50 MG tablet TAKE 1 TABLET EVERY DAY 90 tablet 3  . CALCIUM PO Take 300 mg by mouth daily.    . clopidogrel (PLAVIX) 75 MG tablet TAKE 1 TABLET EVERY DAY 90 tablet 1  . furosemide (LASIX) 20 MG tablet Take 1 tablet (20 mg total) by mouth daily. 90 tablet 2  . isosorbide mononitrate (IMDUR) 60 MG 24 hr tablet TAKE 1 TABLET EVERY DAY 90 tablet 3  . losartan (COZAAR) 100 MG tablet TAKE 1 TABLET EVERY DAY 90 tablet 3  . losartan (COZAAR) 50 MG tablet Take 50 mg by mouth daily.    . metFORMIN (GLUCOPHAGE) 500 MG tablet Take 500 mg by mouth daily with breakfast.    . Multiple Vitamins-Minerals (CENTRUM SILVER PO) Take 1 tablet by mouth daily.    . nitroGLYCERIN (NITROSTAT) 0.4 MG SL tablet Place 1 tablet (0.4 mg total) under the tongue every 5 (five) minutes x 3 doses as needed for chest pain. 25 tablet 12  . pantoprazole  (PROTONIX) 40 MG tablet Take 40 mg daily if needed 90 tablet 3  . rosuvastatin (CRESTOR) 20 MG tablet Take 1 tablet (20 mg total) by mouth daily. 90 tablet 2   No current facility-administered medications for this visit.     Allergies  Allergen Reactions  . Penicillins Rash    Has patient had a PCN reaction causing immediate rash, facial/tongue/throat swelling, SOB or lightheadedness with hypotension: YES Has patient had a PCN reaction causing severe rash involving mucus membranes or skin necrosis: NO Has patient had a PCN reaction that required hospitalization NO Has patient had a PCN reaction occurring within the last 10 years: NO If all of the above answers are "NO", then may proceed with Cephalosporin use.     Social History   Social History  . Marital status: Divorced    Spouse name: N/A  . Number of children: N/A  . Years of education: N/A   Occupational History  . 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 Smoking status: Former Smoker    Quit date: 06/05/2012  . Smokeless tobacco: Never Used  . Alcohol use No  . Drug use: No  . Sexual activity: Not on file   Other Topics Concern  . Not on file  Social History Narrative   Lives alone.     Review of Systems: General: negative for chills, fever, night sweats or weight changes.  Cardiovascular: negative for chest pain, dyspnea on exertion, edema, orthopnea, palpitations, paroxysmal nocturnal dyspnea or shortness of breath Dermatological: negative for rash Respiratory: negative for cough or wheezing Urologic: negative for hematuria Abdominal: negative for nausea, vomiting, diarrhea, bright red blood per rectum, melena, or hematemesis Neurologic: negative for visual changes, syncope, or dizziness All other systems reviewed and are otherwise negative except as noted above.    Blood pressure 126/71, pulse 81, height 5\' 8"  (1.727 m), weight 217 lb (98.4 kg), SpO2 96 %.  General  appearance: alert, cooperative and no distress Neck: no carotid bruit and no JVD Lungs: clear to auscultation bilaterally Heart: regular rate and rhythm, S1, S2 normal, no murmur, click, rub or gallop Extremities: no LEE Pulses: 2+ and symmetric Skin: warm and dry Neurologic: Grossly normal  EKG NSR. Possible inferior infarct, age undetermined   ASSESSMENT AND PLAN:   1. HTN: BP has been running on the higher side recently, in the Q000111Q systolic. He gets symptomatic with this and breaks out into a sweat when BP is up. He was previously on Losartan 100 mg in the past but was cut down to 50 mg. We discussed medication options. He wants to try to increase losartan back up to 100 mg to see if BP improves. Continue Imdur and atenolol. Patient instructed to monitor BP closely at home and to notify us if any low BP.   2. CAD: stable w/o anginal symptoms. EKG shows SR with possible inferior infarct. EKG was reviewed by DOD, Dr. Rayann Heman. He feels that these findings represent scar. In the absence of CP, no further w/u advised. Dr. Rayann Heman has recommended continuation of medical therapy. Continue ASA, Plavix, BB, Imdur and ARB.   PLAN  Keep 6 mo f/u with Dr. Gwenlyn Found 03/2016.  Shaterrica Territo PA-C 02/03/2016 11:08 AM

## 2016-02-16 DIAGNOSIS — E7800 Pure hypercholesterolemia, unspecified: Secondary | ICD-10-CM | POA: Diagnosis not present

## 2016-02-16 DIAGNOSIS — Z23 Encounter for immunization: Secondary | ICD-10-CM | POA: Diagnosis not present

## 2016-02-16 DIAGNOSIS — Z79899 Other long term (current) drug therapy: Secondary | ICD-10-CM | POA: Diagnosis not present

## 2016-02-16 DIAGNOSIS — Z1389 Encounter for screening for other disorder: Secondary | ICD-10-CM | POA: Diagnosis not present

## 2016-02-16 DIAGNOSIS — Z125 Encounter for screening for malignant neoplasm of prostate: Secondary | ICD-10-CM | POA: Diagnosis not present

## 2016-02-16 DIAGNOSIS — Z6832 Body mass index (BMI) 32.0-32.9, adult: Secondary | ICD-10-CM | POA: Diagnosis not present

## 2016-02-16 DIAGNOSIS — E669 Obesity, unspecified: Secondary | ICD-10-CM | POA: Diagnosis not present

## 2016-02-16 DIAGNOSIS — I10 Essential (primary) hypertension: Secondary | ICD-10-CM | POA: Diagnosis not present

## 2016-02-16 DIAGNOSIS — Z0000 Encounter for general adult medical examination without abnormal findings: Secondary | ICD-10-CM | POA: Diagnosis not present

## 2016-02-16 DIAGNOSIS — E119 Type 2 diabetes mellitus without complications: Secondary | ICD-10-CM | POA: Diagnosis not present

## 2016-03-25 DIAGNOSIS — B349 Viral infection, unspecified: Secondary | ICD-10-CM | POA: Diagnosis not present

## 2016-03-25 DIAGNOSIS — R110 Nausea: Secondary | ICD-10-CM | POA: Diagnosis not present

## 2016-03-25 DIAGNOSIS — J3489 Other specified disorders of nose and nasal sinuses: Secondary | ICD-10-CM | POA: Diagnosis not present

## 2016-03-29 DIAGNOSIS — L820 Inflamed seborrheic keratosis: Secondary | ICD-10-CM | POA: Diagnosis not present

## 2016-03-29 DIAGNOSIS — L570 Actinic keratosis: Secondary | ICD-10-CM | POA: Diagnosis not present

## 2016-03-29 DIAGNOSIS — L218 Other seborrheic dermatitis: Secondary | ICD-10-CM | POA: Diagnosis not present

## 2016-03-31 ENCOUNTER — Other Ambulatory Visit: Payer: Self-pay | Admitting: Cardiovascular Disease

## 2016-03-31 NOTE — Telephone Encounter (Signed)
Rx(s) sent to pharmacy electronically.  

## 2016-04-04 ENCOUNTER — Ambulatory Visit (INDEPENDENT_AMBULATORY_CARE_PROVIDER_SITE_OTHER): Payer: Commercial Managed Care - HMO | Admitting: Cardiovascular Disease

## 2016-04-04 ENCOUNTER — Encounter: Payer: Self-pay | Admitting: Cardiovascular Disease

## 2016-04-04 DIAGNOSIS — I739 Peripheral vascular disease, unspecified: Secondary | ICD-10-CM

## 2016-04-04 DIAGNOSIS — I779 Disorder of arteries and arterioles, unspecified: Secondary | ICD-10-CM | POA: Diagnosis not present

## 2016-04-04 DIAGNOSIS — I2510 Atherosclerotic heart disease of native coronary artery without angina pectoris: Secondary | ICD-10-CM

## 2016-04-04 DIAGNOSIS — I10 Essential (primary) hypertension: Secondary | ICD-10-CM

## 2016-04-04 DIAGNOSIS — E7800 Pure hypercholesterolemia, unspecified: Secondary | ICD-10-CM | POA: Diagnosis not present

## 2016-04-04 NOTE — Assessment & Plan Note (Signed)
History of claudication Dopplers performed 08/24/14 were entirely normal.

## 2016-04-04 NOTE — Patient Instructions (Signed)
Medication Instructions:  Continue current medications.   Labwork: Labwork will be requested from your primary care physician.   Testing/Procedures: NONE  Follow-Up: Your physician wants you to follow-up in: [place].   You will receive a reminder letter in the mail two months in advance. If you don't receive a letter, please call our office to schedule the follow-up appointment.   If you need a refill on your cardiac medications before your next appointment, please call your pharmacy.

## 2016-04-04 NOTE — Progress Notes (Signed)
04/04/2016 Reginald Little   1950/12/24  [id number]  Primary Physician Mathews Argyle, MD Primary Cardiologist: Lorretta Harp MD Renae Gloss  HPI:  Reginald Little is a 65 year old mildly overweight divorced Caucasian male with history of CAD status post stenting of his coronary arteries 2 years ago at [facility] using drug-eluting stents.I last saw him in the office 09/14/15.His history is also remarkable for remote tobacco abuse, treated hypertension and hyperlipidemia. He had a left carotid endarterectomy performed at the [facility] in [place] number of years ago as well. He was admitted to the hospital in December with chest pain. A Myoview stress test was high risk and he underwent cardiac catheterization by Dr. Burt Knack stenting to coronary arteries using drug-eluting stents. He had some chest pain since which is improved with the addition of long-acting oral nitrate. He does complain of left hip claudication as well. He was admitted to the hospital for observation overnight 10/21/14 because of witnessed syncope while chronic rehabilitation. His nitrates were decreased in dose. He did have lower extremity artery Doppler studies which were normal ruling out peripheral vascular disease as a cause of his left hip pain. Since I saw him 6 months ago he's remained stable. He has had occasional atypical chest pain. He was seen by Larinda Buttery, PA-C on 02/03/16 and was stable at that time.   Current Outpatient Prescriptions  Medication Sig Dispense Refill  . aspirin EC 81 MG tablet Take 81 mg by mouth daily.    Marland Kitchen atenolol (TENORMIN) 50 MG tablet TAKE 1 TABLET EVERY DAY 90 tablet 3  . clopidogrel (PLAVIX) 75 MG tablet TAKE 1 TABLET EVERY DAY 90 tablet 1  . furosemide (LASIX) 20 MG tablet TAKE 1 TABLET EVERY DAY 90 tablet 3  . isosorbide mononitrate (IMDUR) 60 MG 24 hr tablet TAKE 1 TABLET EVERY DAY 90 tablet 3  . losartan (COZAAR) 50 MG tablet Take 50 mg by  mouth daily.    . metFORMIN (GLUCOPHAGE) 500 MG tablet Take 500 mg by mouth daily with breakfast.    . nitroGLYCERIN (NITROSTAT) 0.4 MG SL tablet Place 1 tablet (0.4 mg total) under the tongue every 5 (five) minutes x 3 doses as needed for chest pain. 25 tablet 12  . pantoprazole (PROTONIX) 40 MG tablet Take 40 mg daily if needed 90 tablet 3  . rosuvastatin (CRESTOR) 20 MG tablet Take 1 tablet (20 mg total) by mouth daily. 90 tablet 2   No current facility-administered medications for this visit.     Allergies  Allergen Reactions  . Penicillins Rash    Has patient had a PCN reaction causing immediate rash, facial/tongue/throat swelling, SOB or lightheadedness with hypotension: YES Has patient had a PCN reaction causing severe rash involving mucus membranes or skin necrosis: NO Has patient had a PCN reaction that required hospitalization NO Has patient had a PCN reaction occurring within the last 10 years: NO If all of the above answers are "NO", then may proceed with Cephalosporin use.     Social History   Social History  . Marital status: Divorced    Spouse name: N/A  . Number of children: N/A  . 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 Smoking status: Former Smoker    Quit date: 06/05/2012  . Smokeless tobacco: Never Used  . Alcohol use No  . Drug use: No  .  Sexual activity: Not on file   Other Topics Concern  . Not on file   Social History Narrative   Lives alone.     Review of Systems: General: negative for chills, fever, night sweats or weight changes.  Cardiovascular: negative for chest pain, dyspnea on exertion, edema, orthopnea, palpitations, paroxysmal nocturnal dyspnea or shortness of breath Dermatological: negative for rash Respiratory: negative for cough or wheezing Urologic: negative for hematuria Abdominal: negative for nausea, vomiting, diarrhea, bright red blood per  rectum, melena, or hematemesis Neurologic: negative for visual changes, syncope, or dizziness All other systems reviewed and are otherwise negative except as noted above.    Blood pressure 134/68, pulse 73, height 5\' 8"  (1.727 m), weight 213 lb (96.6 kg).  General appearance: alert and no distress Neck: no adenopathy, no carotid bruit, no JVD, supple, symmetrical, trachea midline and thyroid not enlarged, symmetric, no tenderness/mass/nodules Lungs: clear to auscultation bilaterally Heart: regular rate and rhythm, S1, S2 normal, no murmur, click, rub or gallop Extremities: extremities normal, atraumatic, no cyanosis or edema  EKG not performed today  ASSESSMENT AND PLAN:   Essential hypertension History of hypertension blood pressure measures 134/68. He is on atenolol and losartan. Continue current meds at current dosing  Hyperlipidemia History of hyperlipidemia on Crestor followed by his PCP  Carotid artery disease ([place]) History of mild carotid artery disease with Dopplers performed 08/24/14 showing mild right ICA stenosis. He did have left carotid endarterectomy performed in [place] remotely and had a widely patent endarterectomy site.  Claudication History of claudication Dopplers performed 08/24/14 were entirely normal.  Coronary artery disease History of CAD status post cardiac catheterization performed by Dr. Burt Knack with implantation of coronary stents in the past. He has had RCA DES 2 at [facility] 05/2012 as well as a DES to an obtuse marginal branch and PLA branch 05/2014. Performed 10/06/14 showed patent stents with nonobstructive CAD. He gets infrequent non-anginal chest pain.      Lorretta Harp MD FACP,FACC,FAHA, [facility] 04/04/2016 10:49 AM

## 2016-04-04 NOTE — Assessment & Plan Note (Signed)
History of mild carotid artery disease with Dopplers performed 08/24/14 showing mild right ICA stenosis. He did have left carotid endarterectomy performed in [place] remotely and had a widely patent endarterectomy site.

## 2016-04-04 NOTE — Assessment & Plan Note (Signed)
History of hypertension blood pressure measures 134/68. He is on atenolol and losartan. Continue current meds at current dosing

## 2016-04-04 NOTE — Assessment & Plan Note (Signed)
History of hyperlipidemia on Crestor followed by his PCP 

## 2016-04-04 NOTE — Assessment & Plan Note (Signed)
History of CAD status post cardiac catheterization performed by Dr. Burt Knack with implantation of coronary stents in the past. He has had RCA DES 2 at [facility] 05/2012 as well as a DES to an obtuse marginal branch and PLA branch 05/2014. Performed 10/06/14 showed patent stents with nonobstructive CAD. He gets infrequent non-anginal chest pain.

## 2016-04-13 ENCOUNTER — Observation Stay (HOSPITAL_COMMUNITY)
Admission: EM | Admit: 2016-04-13 | Discharge: 2016-04-14 | Disposition: A | Discharge status: Home / Self Care | Payer: Commercial Managed Care - HMO | Attending: Internal Medicine | Admitting: Internal Medicine

## 2016-04-13 ENCOUNTER — Encounter (HOSPITAL_COMMUNITY): Payer: Self-pay | Admitting: Emergency Medicine

## 2016-04-13 ENCOUNTER — Emergency Department (HOSPITAL_COMMUNITY): Payer: Commercial Managed Care - HMO

## 2016-04-13 DIAGNOSIS — Z87891 Personal history of nicotine dependence: Secondary | ICD-10-CM | POA: Insufficient documentation

## 2016-04-13 DIAGNOSIS — I10 Essential (primary) hypertension: Secondary | ICD-10-CM | POA: Diagnosis present

## 2016-04-13 DIAGNOSIS — Z9861 Coronary angioplasty status: Secondary | ICD-10-CM

## 2016-04-13 DIAGNOSIS — I2510 Atherosclerotic heart disease of native coronary artery without angina pectoris: Secondary | ICD-10-CM | POA: Diagnosis not present

## 2016-04-13 DIAGNOSIS — Z955 Presence of coronary angioplasty implant and graft: Secondary | ICD-10-CM | POA: Insufficient documentation

## 2016-04-13 DIAGNOSIS — I200 Unstable angina: Secondary | ICD-10-CM

## 2016-04-13 DIAGNOSIS — R05 Cough: Secondary | ICD-10-CM | POA: Diagnosis not present

## 2016-04-13 DIAGNOSIS — R079 Chest pain, unspecified: Principal | ICD-10-CM | POA: Diagnosis present

## 2016-04-13 DIAGNOSIS — E785 Hyperlipidemia, unspecified: Secondary | ICD-10-CM | POA: Diagnosis present

## 2016-04-13 DIAGNOSIS — Z7984 Long term (current) use of oral hypoglycemic drugs: Secondary | ICD-10-CM | POA: Insufficient documentation

## 2016-04-13 DIAGNOSIS — Z7902 Long term (current) use of antithrombotics/antiplatelets: Secondary | ICD-10-CM | POA: Insufficient documentation

## 2016-04-13 DIAGNOSIS — E119 Type 2 diabetes mellitus without complications: Secondary | ICD-10-CM | POA: Insufficient documentation

## 2016-04-13 DIAGNOSIS — Z7982 Long term (current) use of aspirin: Secondary | ICD-10-CM | POA: Insufficient documentation

## 2016-04-13 LAB — CBC
HEMATOCRIT: 42.8 % (ref 39.0–52.0)
Hemoglobin: 14.3 g/dL (ref 13.0–17.0)
MCH: 30.4 pg (ref 26.0–34.0)
MCHC: 33.4 g/dL (ref 30.0–36.0)
MCV: 91.1 fL (ref 78.0–100.0)
PLATELETS: 272 10*3/uL (ref 150–400)
RBC: 4.7 MIL/uL (ref 4.22–5.81)
RDW: 12.8 % (ref 11.5–15.5)
WBC: 6.2 10*3/uL (ref 4.0–10.5)

## 2016-04-13 LAB — BASIC METABOLIC PANEL
Anion gap: 9 (ref 5–15)
BUN: 11 mg/dL (ref 6–20)
CALCIUM: 9.7 mg/dL (ref 8.9–10.3)
CO2: 30 mmol/L (ref 22–32)
CREATININE: 0.95 mg/dL (ref 0.61–1.24)
Chloride: 100 mmol/L — ABNORMAL LOW (ref 101–111)
GFR calc Af Amer: >60 mL/min (ref >60)
GLUCOSE: 133 mg/dL — AB (ref 65–99)
POTASSIUM: 4.3 mmol/L (ref 3.5–5.1)
SODIUM: 139 mmol/L (ref 135–145)

## 2016-04-13 LAB — I-STAT TROPONIN, ED: TROPONIN I, POC: 0 ng/mL (ref 0.00–0.08)

## 2016-04-13 MED ORDER — VITAMIN E 45 MG (100 UNIT) PO CAPS
600.0000 [IU] | ORAL_CAPSULE | Freq: Every day | ORAL | Status: DC
  Filled 2016-04-13: qty 2

## 2016-04-13 MED ORDER — ROSUVASTATIN CALCIUM 10 MG PO TABS: 20.0000 mg | ORAL_TABLET | Freq: Every day | ORAL | Status: DC

## 2016-04-13 MED ORDER — THIAMINE HCL 100 MG/ML IJ SOLN: Freq: Once | INTRAVENOUS | Status: DC

## 2016-04-13 MED ORDER — ONDANSETRON HCL 4 MG/2ML IJ SOLN: 4.0000 mg | Freq: Four times a day (QID) | INTRAMUSCULAR | Status: DC | PRN

## 2016-04-13 MED ORDER — LORAZEPAM 2 MG/ML IJ SOLN: 0.0000 mg | Freq: Four times a day (QID) | INTRAMUSCULAR | Status: DC

## 2016-04-13 MED ORDER — ATENOLOL 25 MG PO TABS: 50.0000 mg | ORAL_TABLET | Freq: Every day | ORAL | Status: DC

## 2016-04-13 MED ORDER — PANTOPRAZOLE SODIUM 40 MG PO TBEC: 40.0000 mg | DELAYED_RELEASE_TABLET | ORAL | Status: DC

## 2016-04-13 MED ORDER — CLOPIDOGREL BISULFATE 75 MG PO TABS: 75.0000 mg | ORAL_TABLET | Freq: Every day | ORAL | Status: DC

## 2016-04-13 MED ORDER — ASPIRIN 81 MG PO CHEW
324.0000 mg | CHEWABLE_TABLET | Freq: Once | ORAL | Status: AC
  Administered 2016-04-13: 324 mg via ORAL
  Filled 2016-04-13: qty 4

## 2016-04-13 MED ORDER — HEPARIN BOLUS VIA INFUSION
4000.0000 [IU] | Freq: Once | INTRAVENOUS | Status: AC
  Administered 2016-04-13: 4000 [IU] via INTRAVENOUS
  Filled 2016-04-13: qty 4000

## 2016-04-13 MED ORDER — ASPIRIN EC 81 MG PO TBEC: 81.0000 mg | DELAYED_RELEASE_TABLET | Freq: Every day | ORAL | Status: DC

## 2016-04-13 MED ORDER — ACETAMINOPHEN 325 MG PO TABS: 650.0000 mg | ORAL_TABLET | ORAL | Status: DC | PRN

## 2016-04-13 MED ORDER — INSULIN ASPART 100 UNIT/ML ~~LOC~~ SOLN: 0.0000 [IU] | Freq: Three times a day (TID) | SUBCUTANEOUS | Status: DC

## 2016-04-13 MED ORDER — HEPARIN (PORCINE) IN NACL 100-0.45 UNIT/ML-% IJ SOLN
1300.0000 [IU]/h | INTRAMUSCULAR | Status: DC
  Administered 2016-04-13: 1300 [IU]/h via INTRAVENOUS
  Filled 2016-04-13: qty 250

## 2016-04-13 MED ORDER — OMEGA-3-ACID ETHYL ESTERS 1 G PO CAPS: 1000.0000 mg | ORAL_CAPSULE | Freq: Every day | ORAL | Status: DC

## 2016-04-13 MED ORDER — NITROGLYCERIN IN D5W 200-5 MCG/ML-% IV SOLN
10.0000 ug/min | INTRAVENOUS | Status: DC
  Administered 2016-04-13: 10 ug/min via INTRAVENOUS
  Filled 2016-04-13: qty 250

## 2016-04-13 MED ORDER — LOSARTAN POTASSIUM 50 MG PO TABS: 50.0000 mg | ORAL_TABLET | Freq: Every day | ORAL | Status: DC

## 2016-04-13 MED ORDER — LORAZEPAM 2 MG/ML IJ SOLN: 0.0000 mg | Freq: Two times a day (BID) | INTRAMUSCULAR | Status: DC

## 2016-04-13 MED ORDER — FUROSEMIDE 20 MG PO TABS: 20.0000 mg | ORAL_TABLET | Freq: Every day | ORAL | Status: DC

## 2016-04-13 NOTE — ED Provider Notes (Signed)
[facility] DEPT Provider Note   CSN: [id number] Arrival date & time: 04/13/16  1630     History   Chief Complaint Chief Complaint  Patient presents with  . Chest Pain    HPI Reginald Little is a 66 y.o. male.  The history is provided by the patient and medical records. No language interpreter was used.  Chest Pain   Associated symptoms include diaphoresis and nausea. Pertinent negatives include no abdominal pain, no cough, no fever, no headaches, no palpitations, no shortness of breath and no vomiting.   Reginald Little is a 65 y.o. male  with a PMH of CAD with four stents in place, DM, HLD, HTN who presents to the Emergency Department complaining of left sided chest pain With radiation to right arm beginning yesterday. Patient states he has had 8-9 episodes each lasting a few minutes and nonexertional. Episodes today were much more painful. Associated symptoms include diaphoresis, nausea and cold sweats. He went to [facility] urgent care earlier today who sent him to ED for further evaluation. No medications were taken prior to arrival for symptoms. Each episode seemed to resolve without intervention. Denies shortness of breath, back pain, fever, abdominal pain, emesis.   Past Medical History:  Diagnosis Date  . Carotid artery disease ([place])    a. s/p L CEA  . Coronary artery disease    a. s/p DES x 2 to the RCA (05/2012 at [facility]) b. s/p DES of OM and PLA branches (05/2014)  c. s/p LHC on 10/06/14 with patent stents and non-obs CAD   . Diabetes mellitus without complication ([place])    a. diet controlled   . GERD (gastroesophageal reflux disease)   . HLD (hyperlipidemia)   . Hypertension   . Obesity   . Syncope    a. 10/2014 - felt to be 2/2 hypotension after recent imdur titration.    Patient Active Problem List   Diagnosis Date Noted  . GERD (gastroesophageal reflux disease)   . Coronary artery disease   . Hypertension   . Syncope 10/21/2014  . Dyspnea on exertion   . CAD S/P  percutaneous coronary angioplasty   . Exertional angina ([facility])   . Hyperlipidemia 08/18/2014  . Carotid artery disease ([place]) 08/18/2014  . Claudication ([place]) 08/18/2014  . Left hip pain 07/07/2014  . Essential hypertension 07/07/2014  . Obesity (BMI 30.0-34.9) 07/07/2014  . Chest pain 06/06/2014    Past Surgical History:  Procedure Laterality Date  . CAROTID ENDARTERECTOMY    . CORONARY ANGIOPLASTY WITH STENT PLACEMENT    . LEFT HEART CATHETERIZATION WITH CORONARY ANGIOGRAM N/A 06/08/2014   Procedure: LEFT HEART CATHETERIZATION WITH CORONARY ANGIOGRAM;  Surgeon: Blane Ohara, MD;  Location: [facility] CATH LAB;  Service: Cardiovascular;  Laterality: N/A;  . LEFT HEART CATHETERIZATION WITH CORONARY ANGIOGRAM N/A 10/06/2014   Procedure: LEFT HEART CATHETERIZATION WITH CORONARY ANGIOGRAM;  Surgeon: Leonie Man, MD;  Location: [facility] CATH LAB;  Service: Cardiovascular;  Laterality: N/A;       Home Medications    Prior to Admission medications   Medication Sig Start Date End Date Taking? Authorizing Provider  aspirin EC 81 MG tablet Take 81 mg by mouth daily.   Yes Historical Provider, MD  atenolol (TENORMIN) 50 MG tablet TAKE 1 TABLET EVERY DAY Patient taking differently: Take 50 mg by mouth once a day 11/19/15  Yes Lorretta Harp, MD  Calcium Carb-Cholecalciferol (CALCIUM 600 + D PO) Take 1 tablet by mouth daily.   Yes Historical Provider,  MD  Cholecalciferol (VITAMIN D-3) 1000 units CAPS Take 2,000 Units by mouth daily.   Yes Historical Provider, MD  clopidogrel (PLAVIX) 75 MG tablet TAKE 1 TABLET EVERY DAY Patient taking differently: Take 75 mg by mouth once a day 11/19/15  Yes Lorretta Harp, MD  Coenzyme Q10 (CO Q 10 PO) Take 1 capsule by mouth daily.   Yes Historical Provider, MD  furosemide (LASIX) 20 MG tablet TAKE 1 TABLET EVERY DAY Patient taking differently: Take 20 mg by mouth once a day 03/31/16  Yes Lorretta Harp, MD  ipratropium (ATROVENT) 0.06 % nasal spray Place 1-2  sprays into both nostrils daily as needed for rhinitis.  03/25/16  Yes Historical Provider, MD  isosorbide mononitrate (IMDUR) 60 MG 24 hr tablet TAKE 1 TABLET EVERY DAY Patient taking differently: Take 60 mg by mouth once a day 11/19/15  Yes Lorretta Harp, MD  ketoconazole (NIZORAL) 2 % cream Apply 1 application topically at bedtime. 03/29/16  Yes Historical Provider, MD  losartan (COZAAR) 50 MG tablet Take 50 mg by mouth daily.   Yes Historical Provider, MD  metFORMIN (GLUCOPHAGE-XR) 500 MG 24 hr tablet Take 500 mg by mouth every evening. 03/16/16  Yes Historical Provider, MD  Multiple Vitamins-Minerals (ONE-A-DAY MENS 50+ ADVANTAGE) TABS Take 1 tablet by mouth daily.   Yes Historical Provider, MD  nitroGLYCERIN (NITROSTAT) 0.4 MG SL tablet Place 1 tablet (0.4 mg total) under the tongue every 5 (five) minutes x 3 doses as needed for chest pain. 10/06/14  Yes Eileen Stanford, PA-C  Omega-3 300 MG CAPS Take 900 mg by mouth daily.   Yes Historical Provider, MD  pantoprazole (PROTONIX) 40 MG tablet Take 40 mg daily if needed Patient taking differently: Take 40 mg by mouth every other day.  12/27/15  Yes Lorretta Harp, MD  rosuvastatin (CRESTOR) 20 MG tablet Take 1 tablet (20 mg total) by mouth daily. 12/27/15  Yes Lorretta Harp, MD  vitamin E 600 UNIT capsule Take 600 Units by mouth daily.   Yes Historical Provider, MD    Family History Family History  Problem Relation Age of Onset  . Heart attack Father 58  . COPD Mother 84    Social History Social History  Substance Use Topics  . Smoking status: Former Smoker    Quit date: 06/05/2012  . Smokeless tobacco: Never Used  . Alcohol use No     Allergies   Penicillins   Review of Systems Review of Systems  Constitutional: Positive for diaphoresis. Negative for fever.  HENT: Negative for congestion.   Eyes: Negative for visual disturbance.  Respiratory: Negative for cough and shortness of breath.   Cardiovascular: Positive for  chest pain. Negative for palpitations and leg swelling.  Gastrointestinal: Positive for nausea. Negative for abdominal pain and vomiting.  Genitourinary: Negative for dysuria.  Musculoskeletal: Negative for myalgias.  Skin: Negative for pallor.  Neurological: Negative for headaches.     Physical Exam Updated Vital Signs BP 136/65 (BP Location: Right Arm)   Pulse 69   Temp 98.1 F (36.7 C) (Oral)   Resp 14   Ht 5\' 8"  (1.727 m)   Wt 96.8 kg   SpO2 100%   BMI 32.43 kg/m   Physical Exam  Constitutional: He is oriented to person, place, and time. He appears well-developed and well-nourished. No distress.  HENT:  Head: Normocephalic and atraumatic.  Cardiovascular: Normal rate, regular rhythm, normal heart sounds and intact distal pulses.  Exam reveals no  gallop and no friction rub.   No murmur heard. Pulmonary/Chest: Effort normal and breath sounds normal. No respiratory distress. He has no wheezes. He has no rales. He exhibits no tenderness.  Abdominal: Soft. Bowel sounds are normal. He exhibits no distension. There is no tenderness.  Musculoskeletal: He exhibits no edema.  Neurological: He is alert and oriented to person, place, and time.  Skin: Skin is warm and dry.  Nursing note and vitals reviewed.    ED Treatments / Results  Labs (all labs ordered are listed, but only abnormal results are displayed) Labs Reviewed  BASIC METABOLIC PANEL - Abnormal; Notable for the following:       Result Value   Chloride 100 (*)    Glucose, Bld 133 (*)    All other components within normal limits  CBC  HEPARIN LEVEL (UNFRACTIONATED)  I-STAT TROPOININ, ED    EKG  EKG Interpretation  Date/Time:  Thursday April 13 2016 16:35:55 EDT Ventricular Rate:  77 PR Interval:  148 QRS Duration: 86 QT Interval:  394 QTC Calculation: 445 R Axis:   60 Text Interpretation:  Normal sinus rhythm Nonspecific ST abnormality When compared with ECG of 08/23/2015, No significant change was  found Confirmed by [facility]  MD, DAVID ([id number]) on 04/13/2016 4:39:42 PM       Radiology Dg Chest 2 View  Result Date: 04/13/2016 CLINICAL DATA:  Dry cough for 1 week, left-sided chest pain with cold sweats EXAM: CHEST  2 VIEW COMPARISON:  10/19/2015 FINDINGS: There is mild hyperinflation. Stable mild elevation of left diaphragm with probable scarring at the left base. No acute consolidation or effusion. Stable cardiomediastinal silhouette with atherosclerosis. No pneumothorax. Surgical clips in the left neck. IMPRESSION: 1. No acute infiltrate or edema 2. Stable slightly elevated left diaphragm with probable basilar scarring 3. Atherosclerosis of the aorta Electronically Signed   By: Donavan Foil M.D.   On: 04/13/2016 18:51    Procedures Procedures (including critical care time)  CRITICAL CARE Performed by: Ozella Almond Ward   Total critical care time: 35 minutes  Critical care time was exclusive of separately billable procedures and treating other patients.  Critical care was necessary to treat or prevent imminent or life-threatening deterioration.  Critical care was time spent personally by me on the following activities: development of treatment plan with patient and/or surrogate as well as nursing, discussions with consultants, evaluation of patient's response to treatment, examination of patient, obtaining history from patient or surrogate, ordering and performing treatments and interventions, ordering and review of laboratory studies, ordering and review of radiographic studies, pulse oximetry and re-evaluation of patient's condition.  Medications Ordered in ED Medications  nitroGLYCERIN 50 mg in dextrose 5 % 250 mL (0.2 mg/mL) infusion (10 mcg/min Intravenous Transfusing/Transfer 04/13/16 2047)  heparin ADULT infusion 100 units/mL (25000 units/214mL sodium chloride 0.45%) (1,300 Units/hr Intravenous Transfusing/Transfer 04/13/16 2047)  aspirin chewable tablet 324 mg (324 mg Oral  Given 04/13/16 1945)  heparin bolus via infusion 4,000 Units (4,000 Units Intravenous Bolus from Bag 04/13/16 1945)     Initial Impression / Assessment and Plan / ED Course  I have reviewed the triage vital signs and the nursing notes.  Pertinent labs & imaging results that were available during my care of the patient were reviewed by me and considered in my medical decision making (see chart for details).  Clinical Course   Ajai Burkland is a 65 y.o. male who presents to ED for chest pain. Hx of CAD with 4 stents in  place. Initial troponin negative and EKG unchanged from prior tracing. Given high risk chest pain patient and concerning history, will start heparin and nitro drip.  8:04 PM - Cardiology, Dr. Marlou Porch, consulted who recommends medical admission and they will see patient in consultation. Trend enzymes, likely intervention only if enzymes bump.   CXR and labs reviewed. Hospitalist to admit.   Patient seen by and discussed with Dr. Roxanne Mins who agrees with treatment plan.    Final Clinical Impressions(s) / ED Diagnoses   Final diagnoses:  Chest pain, unspecified type    New Prescriptions Current Discharge Medication List         [facility] Ward, PA-C [id number]    Delora Fuel, MD [id number]

## 2016-04-13 NOTE — ED Triage Notes (Signed)
Pt presents to ED for UC for assessment of left sided chest pain which started last night.  Pt describes it as "crushing" and pt sts he had "cold sweats".  Pt also c/o feeling light-headed.  Pt sts the pain has been intermittent, but he has had four episodes today and became concerned.  Pt c/o pain to the right shoulder as well.

## 2016-04-13 NOTE — Progress Notes (Signed)
[facility] for heparin  Indication: chest pain/ACS  Allergies  Allergen Reactions  . Penicillins Rash    Has patient had a PCN reaction causing immediate rash, facial/tongue/throat swelling, SOB or lightheadedness with hypotension: YES Has patient had a PCN reaction causing severe rash involving mucus membranes or skin necrosis: NO Has patient had a PCN reaction that required hospitalization NO Has patient had a PCN reaction occurring within the last 10 years: NO If all of the above answers are "NO", then may proceed with Cephalosporin use.     Patient Measurements: Height: 5\' 8"  (172.7 cm) Weight: 207 lb (93.9 kg) IBW/kg (Calculated) : 68.4  Vital Signs: Temp: 97.9 F (36.6 C) (11/02 1637) Temp Source: Oral (11/02 1637) BP: 141/75 (11/02 1930) Pulse Rate: 75 (11/02 1930)  Labs:  Recent Labs  04/13/16 1640  HGB 14.3  HCT 42.8  PLT 272  CREATININE 0.95    Estimated Creatinine Clearance: 85 mL/min (by C-G formula based on SCr of 0.95 mg/dL).   Medical History: Past Medical History:  Diagnosis Date  . Carotid artery disease ([place])    a. s/p L CEA  . Coronary artery disease    a. s/p DES x 2 to the RCA (05/2012 at [facility]) b. s/p DES of OM and PLA branches (05/2014)  c. s/p LHC on 10/06/14 with patent stents and non-obs CAD   . Diabetes mellitus without complication ([place])    a. diet controlled   . GERD (gastroesophageal reflux disease)   . HLD (hyperlipidemia)   . Hypertension   . Obesity   . Syncope    a. 10/2014 - felt to be 2/2 hypotension after recent imdur titration.    Assessment: 65 yo male with significant cardiac history admitted with CP. Pharmacy to dose heparin. No known anticoagulation PTA. CBC stable.    Goal of Therapy:  Heparin level 0.3-0.7 units/ml Monitor platelets by anticoagulation protocol: Yes   Plan:  1. Give 4000 units bolus x 1 2. Start heparin infusion at 1300 units/hr 3. Check anti-Xa level in 6  hours and daily while on heparin 4. Continue to monitor H&H and platelets    Vincenza Hews, PharmD, BCPS 04/13/2016, 7:54 PM Pager: [phone]

## 2016-04-13 NOTE — H&P (Signed)
History and Physical    Reginald Little [id number] DOB: 20-May-1951 DOA: 04/13/2016  PCP: Mathews Argyle, MD  Patient coming from: Home.  Chief Complaint: Chest pain.  HPI: Reginald Little is a 65 y.o. male with history of CAD status post stenting last cardiac cath in April 2016, diabetes mellitus, hypertension, hyperlipidemia, history of left-sided carotid endarterectomy presents to the ER because of chest pain. Patient started experiencing chest pain since yesterday. Pain was lasting around 5 minutes retrosternal pressure like associated with diaphoresis. First happened around 1 PM and had again similarly at bedtime. After waking up in the morning patient started having symptoms again when patient decided to come to the ER. In the ER patient is chest pain-free. EKG shows normal sinus rhythm with nonspecific changes troponins were negative. ER physician discussed with cardiologist and patient was started on heparin and nitroglycerin and admitted for further observation.   ED Course: Heparin and nitroglycerin was started. Troponin EKG and chest x-ray unremarkable.  Review of Systems: As per HPI, rest all negative.   Past Medical History:  Diagnosis Date  . Carotid artery disease ([place])    a. s/p L CEA  . Coronary artery disease    a. s/p DES x 2 to the RCA (05/2012 at [facility]) b. s/p DES of OM and PLA branches (05/2014)  c. s/p LHC on 10/06/14 with patent stents and non-obs CAD   . Diabetes mellitus without complication ([place])    a. diet controlled   . GERD (gastroesophageal reflux disease)   . HLD (hyperlipidemia)   . Hypertension   . Obesity   . Syncope    a. 10/2014 - felt to be 2/2 hypotension after recent imdur titration.    Past Surgical History:  Procedure Laterality Date  . CAROTID ENDARTERECTOMY    . CORONARY ANGIOPLASTY WITH STENT PLACEMENT    . LEFT HEART CATHETERIZATION WITH CORONARY ANGIOGRAM N/A 06/08/2014   Procedure: LEFT HEART CATHETERIZATION WITH CORONARY ANGIOGRAM;   Surgeon: Blane Ohara, MD;  Location: [facility] CATH LAB;  Service: Cardiovascular;  Laterality: N/A;  . LEFT HEART CATHETERIZATION WITH CORONARY ANGIOGRAM N/A 10/06/2014   Procedure: LEFT HEART CATHETERIZATION WITH CORONARY ANGIOGRAM;  Surgeon: Leonie Man, MD;  Location: [facility] C[facility] LAB;  Service: Cardiovascular;  Laterality: N/A;     reports that he quit smoking about 3 years ago. He has never used smokeless tobacco. He reports that he does not drink alcohol or use drugs.  Allergies  Allergen Reactions  . Penicillins Rash    Has patient had a PCN reaction causing immediate rash, facial/tongue/throat swelling, SOB or lightheadedness with hypotension: YES Has patient had a PCN reaction causing severe rash involving mucus membranes or skin necrosis: NO Has patient had a PCN reaction that required hospitalization NO Has patient had a PCN reaction occurring within the last 10 years: NO If all of the above answers are "NO", then may proceed with Cephalosporin use.     Family History  Problem Relation Age of Onset  . Heart attack Father 27  . COPD Mother 64    Prior to Admission medications   Medication Sig Start Date End Date Taking? Authorizing Provider  aspirin EC 81 MG tablet Take 81 mg by mouth daily.   Yes Historical Provider, MD  atenolol (TENORMIN) 50 MG tablet TAKE 1 TABLET EVERY DAY Patient taking differently: Take 50 mg by mouth once a day 11/19/15  Yes Lorretta Harp, MD  Calcium Carb-Cholecalciferol (CALCIUM 600 + D PO) Take 1  tablet by mouth daily.   Yes Historical Provider, MD  Cholecalciferol (VITAMIN D-3) 1000 units CAPS Take 2,000 Units by mouth daily.   Yes Historical Provider, MD  clopidogrel (PLAVIX) 75 MG tablet TAKE 1 TABLET EVERY DAY Patient taking differently: Take 75 mg by mouth once a day 11/19/15  Yes Lorretta Harp, MD  Coenzyme Q10 (CO Q 10 PO) Take 1 capsule by mouth daily.   Yes Historical Provider, MD  furosemide (LASIX) 20 MG tablet TAKE 1 TABLET EVERY  DAY Patient taking differently: Take 20 mg by mouth once a day 03/31/16  Yes Lorretta Harp, MD  ipratropium (ATROVENT) 0.06 % nasal spray Place 1-2 sprays into both nostrils daily as needed for rhinitis.  03/25/16  Yes Historical Provider, MD  isosorbide mononitrate (IMDUR) 60 MG 24 hr tablet TAKE 1 TABLET EVERY DAY Patient taking differently: Take 60 mg by mouth once a day 11/19/15  Yes Lorretta Harp, MD  ketoconazole (NIZORAL) 2 % cream Apply 1 application topically at bedtime. 03/29/16  Yes Historical Provider, MD  losartan (COZAAR) 50 MG tablet Take 50 mg by mouth daily.   Yes Historical Provider, MD  metFORMIN (GLUCOPHAGE-XR) 500 MG 24 hr tablet Take 500 mg by mouth every evening. 03/16/16  Yes Historical Provider, MD  Multiple Vitamins-Minerals (ONE-A-DAY MENS 50+ ADVANTAGE) TABS Take 1 tablet by mouth daily.   Yes Historical Provider, MD  nitroGLYCERIN (NITROSTAT) 0.4 MG SL tablet Place 1 tablet (0.4 mg total) under the tongue every 5 (five) minutes x 3 doses as needed for chest pain. 10/06/14  Yes Eileen Stanford, PA-C  Omega-3 300 MG CAPS Take 900 mg by mouth daily.   Yes Historical Provider, MD  pantoprazole (PROTONIX) 40 MG tablet Take 40 mg daily if needed Patient taking differently: Take 40 mg by mouth every other day.  12/27/15  Yes Lorretta Harp, MD  rosuvastatin (CRESTOR) 20 MG tablet Take 1 tablet (20 mg total) by mouth daily. 12/27/15  Yes Lorretta Harp, MD  vitamin E 600 UNIT capsule Take 600 Units by mouth daily.   Yes Historical Provider, MD    Physical Exam: Vitals:   04/13/16 2000 04/13/16 2015 04/13/16 2045 04/13/16 2121  BP: (!) 99/44 111/65 119/58 136/65  Pulse: 67 71 68 69  Resp: 15 13 14    Temp:    98.1 F (36.7 C)  TempSrc:    Oral  SpO2: 100% 95% 97% 100%  Weight:    96.8 kg (213 lb 4.8 oz)  Height:    5\' 8"  (1.727 m)      Constitutional: Moderately built and nourished. Vitals:   04/13/16 2000 04/13/16 2015 04/13/16 2045 04/13/16 2121  BP:  (!) 99/44 111/65 119/58 136/65  Pulse: 67 71 68 69  Resp: 15 13 14    Temp:    98.1 F (36.7 C)  TempSrc:    Oral  SpO2: 100% 95% 97% 100%  Weight:    96.8 kg (213 lb 4.8 oz)  Height:    5\' 8"  (1.727 m)   Eyes: Anicteric no pallor. ENMT: No discharge from the ears eyes nose or mouth. Neck: No mass felt. No JVD appreciated. Respiratory: No rhonchi or crepitations. Cardiovascular: S1 and S2 heard. No murmurs appreciated. Abdomen: Soft nontender bowel sounds present. No guarding or rigidity. Musculoskeletal: No edema. No joint effusion. Skin: Appears warm. No rash. Neurologic: Alert awake oriented to time place and person. Moves all extremities. Psychiatric: Appears normal. Normal affect.   Labs on  Admission: I have personally reviewed following labs and imaging studies  CBC:  Recent Labs Lab 04/13/16 1640  WBC 6.2  HGB 14.3  HCT 42.8  MCV 91.1  PLT [id number]   Basic Metabolic Panel:  Recent Labs Lab 04/13/16 1640  NA 139  K 4.3  CL 100*  CO2 30  GLUCOSE 133*  BUN 11  CREATININE 0.95  CALCIUM 9.7   GFR: Estimated Creatinine Clearance: 86.3 mL/min (by C-G formula based on SCr of 0.95 mg/dL). Liver Function Tests: No results for input(s): AST, ALT, ALKPHOS, BILITOT, PROT, ALBUMIN in the last 168 hours. No results for input(s): LIPASE, AMYLASE in the last 168 hours. No results for input(s): AMMONIA in the last 168 hours. Coagulation Profile: No results for input(s): INR, PROTIME in the last 168 hours. Cardiac Enzymes: No results for input(s): CKTOTAL, CKMB, CKMBINDEX, TROPONINI in the last 168 hours. BNP (last 3 results) No results for input(s): PROBNP in the last 8760 hours. HbA1C: No results for input(s): HGBA1C in the last 72 hours. CBG: No results for input(s): GLUCAP in the last 168 hours. Lipid Profile: No results for input(s): CHOL, HDL, LDLCALC, TRIG, CHOLHDL, LDLDIRECT in the last 72 hours. Thyroid Function Tests: No results for input(s): TSH, T4TOTAL,  FREET4, T3FREE, THYROIDAB in the last 72 hours. Anemia Panel: No results for input(s): VITAMINB12, FOLATE, FERRITIN, TIBC, IRON, RETICCTPCT in the last 72 hours. Urine analysis:    Component Value Date/Time   COLORURINE AMBER (A) 08/23/2015 1811   APPEARANCEUR CLOUDY (A) 08/23/2015 1811   LABSPEC 1.020 08/23/2015 1811   PHURINE 5.5 08/23/2015 1811   GLUCOSEU NEGATIVE 08/23/2015 1811   HGBUR LARGE (A) 08/23/2015 1811   BILIRUBINUR SMALL (A) 08/23/2015 1811   KETONESUR NEGATIVE 08/23/2015 1811   PROTEINUR NEGATIVE 08/23/2015 1811   NITRITE NEGATIVE 08/23/2015 1811   LEUKOCYTESUR NEGATIVE 08/23/2015 1811   Sepsis Labs: @LABRCNTIP (procalcitonin:4,lacticidven:4) )No results found for this or any previous visit (from the past 240 hour(s)).   Radiological Exams on Admission: Dg Chest 2 View  Result Date: 04/13/2016 CLINICAL DATA:  Dry cough for 1 week, left-sided chest pain with cold sweats EXAM: CHEST  2 VIEW COMPARISON:  10/19/2015 FINDINGS: There is mild hyperinflation. Stable mild elevation of left diaphragm with probable scarring at the left base. No acute consolidation or effusion. Stable cardiomediastinal silhouette with atherosclerosis. No pneumothorax. Surgical clips in the left neck. IMPRESSION: 1. No acute infiltrate or edema 2. Stable slightly elevated left diaphragm with probable basilar scarring 3. Atherosclerosis of the aorta Electronically Signed   By: Donavan Foil M.D.   On: 04/13/2016 18:51    EKG: Independently reviewed. Normal sinus rhythm with nonspecific ST-T changes.  Assessment/Plan Principal Problem:   Chest pain Active Problems:   Hyperlipidemia   CAD S/P percutaneous coronary angioplasty   Hypertension    1. Chest pain concerning for unstable angina - patient was started on heparin and nitroglycerin infusion which would be continued. Cycle cardiac markers. Continue statins, beta blockers and aspirin. Check 2-D echo. Keep patient nothing by mouth in a.m. in  anticipation of procedure. Consult cardiologist in a.m. 2. Hypertension - continue atenolol and Lasix and Cozaar. 3. Diabetes mellitus type 2 - hold metformin while inpatient and on sliding-scale coverage. 4. Hyperlipidemia on statins. 5. History of left-sided carotid endarterectomy.   DVT prophylaxis: Heparin. Code Status: Full code.  Family Communication: Discussed with patient.  Disposition Plan: Home.  Consults called: None.  Admission status: Observation.    Rise Patience MD [facility]  Pager [phone].  If 7PM-7AM, please contact night-coverage [email or web address] Password [facility]  04/13/2016, 10:44 PM

## 2016-04-13 NOTE — ED Notes (Signed)
Jamie PA at bedside.   

## 2016-04-14 DIAGNOSIS — I2510 Atherosclerotic heart disease of native coronary artery without angina pectoris: Secondary | ICD-10-CM

## 2016-04-14 DIAGNOSIS — Z9861 Coronary angioplasty status: Secondary | ICD-10-CM

## 2016-04-14 DIAGNOSIS — I10 Essential (primary) hypertension: Secondary | ICD-10-CM | POA: Diagnosis not present

## 2016-04-14 DIAGNOSIS — E7800 Pure hypercholesterolemia, unspecified: Secondary | ICD-10-CM

## 2016-04-14 DIAGNOSIS — R0789 Other chest pain: Secondary | ICD-10-CM

## 2016-04-14 LAB — TROPONIN I: Troponin I: <0.03 ng/mL (ref <0.03)

## 2016-04-14 LAB — HEPARIN LEVEL (UNFRACTIONATED)
HEPARIN UNFRACTIONATED: 0.54 [IU]/mL (ref 0.30–0.70)
HEPARIN UNFRACTIONATED: 0.17 [IU]/mL — AB (ref 0.30–0.70)

## 2016-04-14 LAB — GLUCOSE, CAPILLARY: Glucose-Capillary: 112 mg/dL — ABNORMAL HIGH (ref 65–99)

## 2016-04-14 NOTE — Discharge Instructions (Signed)

## 2016-04-14 NOTE — Discharge Summary (Signed)
Physician Discharge Summary  Reginald Little [id number] DOB: Oct 13, 1950 DOA: 04/13/2016  PCP: Mathews Argyle, MD  Admit date: 04/13/2016 Discharge date: 04/14/2016  Admitted From: home Disposition:  home  Recommendations for Outpatient Follow-up:  1. Follow up with PCP in 1-2 weeks 2. Cardiology appointment next week as scheduled  Home Health: none Equipment/Devices: none  Discharge Condition: stable CODE STATUS: Full Diet recommendation: heart healthy  HPI: Reginald Little is a 65 y.o. male with history of CAD status post stenting last cardiac cath in April 2016, diabetes mellitus, hypertension, hyperlipidemia, history of left-sided carotid endarterectomy presents to the ER because of chest pain. Patient started experiencing chest pain since yesterday. Pain was lasting around 5 minutes retrosternal pressure like associated with diaphoresis. First happened around 1 PM and had again similarly at bedtime. After waking up in the morning patient started having symptoms again when patient decided to come to the ER. In the ER patient is chest pain-free. EKG shows normal sinus rhythm with nonspecific changes troponins were negative. ER physician discussed with cardiologist and patient was started on heparin and nitroglycerin and admitted for further observation. ED Course: Heparin and nitroglycerin was started. Troponin EKG and chest x-ray unremarkable.  Hospital Course: Discharge Diagnoses:  Principal Problem:   Chest pain Active Problems:   Hyperlipidemia   CAD S/P percutaneous coronary angioplasty   Hypertension   Chest pain - patient was admitted to telemetry with chest pain concerning for unstable angina, started on heparin infusion and cardiology was consulted. He was evaluated, I discussed over the phone with Dr. Marlou Porch, his presentation is quite atypical and less likely to represent a cardiac event, OK for home discharge with close outpatient cardiology follow up. Patient's  symptoms have resolved and he was back to baseline. No medication changes were made on discharge. Hypertension - resume home medications  Diabetes mellitus type 2 - resume home medications Hyperlipidemia on statins. History of left-sided carotid endarterectomy.   Discharge Instructions     Medication List    TAKE these medications   aspirin EC 81 MG tablet Take 81 mg by mouth daily.   atenolol 50 MG tablet Commonly known as:  TENORMIN TAKE 1 TABLET EVERY DAY What changed:  See the new instructions.   CALCIUM 600 + D PO Take 1 tablet by mouth daily.   clopidogrel 75 MG tablet Commonly known as:  PLAVIX TAKE 1 TABLET EVERY DAY What changed:  See the new instructions.   CO Q 10 PO Take 1 capsule by mouth daily.   furosemide 20 MG tablet Commonly known as:  LASIX TAKE 1 TABLET EVERY DAY What changed:  See the new instructions.   ipratropium 0.06 % nasal spray Commonly known as:  ATROVENT Place 1-2 sprays into both nostrils daily as needed for rhinitis.   isosorbide mononitrate 60 MG 24 hr tablet Commonly known as:  IMDUR TAKE 1 TABLET EVERY DAY What changed:  See the new instructions.   ketoconazole 2 % cream Commonly known as:  NIZORAL Apply 1 application topically at bedtime.   losartan 50 MG tablet Commonly known as:  COZAAR Take 50 mg by mouth daily.   metFORMIN 500 MG 24 hr tablet Commonly known as:  GLUCOPHAGE-XR Take 500 mg by mouth every evening.   nitroGLYCERIN 0.4 MG SL tablet Commonly known as:  NITROSTAT Place 1 tablet (0.4 mg total) under the tongue every 5 (five) minutes x 3 doses as needed for chest pain.   Omega-3 300 MG Caps Take 900 mg  by mouth daily.   ONE-A-DAY MENS 50+ ADVANTAGE Tabs Take 1 tablet by mouth daily.   pantoprazole 40 MG tablet Commonly known as:  PROTONIX Take 40 mg daily if needed What changed:  how much to take  how to take this  when to take this  additional instructions   rosuvastatin 20 MG  tablet Commonly known as:  CRESTOR Take 1 tablet (20 mg total) by mouth daily.   Vitamin D-3 1000 units Caps Take 2,000 Units by mouth daily.   vitamin E 600 UNIT capsule Take 600 Units by mouth daily.      Follow-up Information    Barrett, Suanne Marker, PA-C. Go on 04/18/2016.   Specialties:  Cardiology, Radiology Why:  @9 :30am for hospital follow up Contact information: [address] [place] [address] [phone]          Allergies  Allergen Reactions  . Penicillins Rash    Has patient had a PCN reaction causing immediate rash, facial/tongue/throat swelling, SOB or lightheadedness with hypotension: YES Has patient had a PCN reaction causing severe rash involving mucus membranes or skin necrosis: NO Has patient had a PCN reaction that required hospitalization NO Has patient had a PCN reaction occurring within the last 10 years: NO If all of the above answers are "NO", then may proceed with Cephalosporin use.     Consultations:  Cardiology   Procedures/Studies: Dg Chest 2 View  Result Date: 04/13/2016 CLINICAL DATA:  Dry cough for 1 week, left-sided chest pain with cold sweats EXAM: CHEST  2 VIEW COMPARISON:  10/19/2015 FINDINGS: There is mild hyperinflation. Stable mild elevation of left diaphragm with probable scarring at the left base. No acute consolidation or effusion. Stable cardiomediastinal silhouette with atherosclerosis. No pneumothorax. Surgical clips in the left neck. IMPRESSION: 1. No acute infiltrate or edema 2. Stable slightly elevated left diaphragm with probable basilar scarring 3. Atherosclerosis of the aorta Electronically Signed   By: Donavan Foil M.D.   On: 04/13/2016 18:51     Subjective: - no chest pain, shortness of breath, no abdominal pain, nausea or vomiting.   Discharge Exam: Vitals:   04/14/16 0426 04/14/16 0739  BP: 121/68 132/69  Pulse: 71 71  Resp:  20  Temp: 98.2 F (36.8 C) 98.4 F (36.9 C)   Vitals:   04/13/16  2121 04/14/16 0007 04/14/16 0426 04/14/16 0739  BP: 136/65 (!) 106/56 121/68 132/69  Pulse: 69 66 71 71  Resp:    20  Temp: 98.1 F (36.7 C) 97.4 F (36.3 C) 98.2 F (36.8 C) 98.4 F (36.9 C)  TempSrc: Oral Oral Oral Oral  SpO2: 100% 94% 97% 98%  Weight: 96.8 kg (213 lb 4.8 oz)  95.3 kg (210 lb)   Height: 5\' 8"  (1.727 m)       General: Pt is alert, awake, not in acute distress  The results of significant diagnostics from this hospitalization (including imaging, microbiology, ancillary and laboratory) are listed below for reference.    Labs: Basic Metabolic Panel:  Recent Labs Lab 04/13/16 1640  NA 139  K 4.3  CL 100*  CO2 30  GLUCOSE 133*  BUN 11  CREATININE 0.95  CALCIUM 9.7   CBC:  Recent Labs Lab 04/13/16 1640  WBC 6.2  HGB 14.3  HCT 42.8  MCV 91.1  PLT 272   Cardiac Enzymes:  Recent Labs Lab 04/13/16 2314 04/14/16 0255  TROPONINI <0.03 <0.03   BNP: Invalid input(s): POCBNP CBG:  Recent Labs Lab 04/14/16  0741  GLUCAP 112*   D-Dimer No results for input(s): DDIMER in the last 72 hours. Hgb A1c No results for input(s): HGBA1C in the last 72 hours. Lipid Profile No results for input(s): CHOL, HDL, LDLCALC, TRIG, CHOLHDL, LDLDIRECT in the last 72 hours. Thyroid function studies No results for input(s): TSH, T4TOTAL, T3FREE, THYROIDAB in the last 72 hours.  Invalid input(s): FREET3 Anemia work up No results for input(s): VITAMINB12, FOLATE, FERRITIN, TIBC, IRON, RETICCTPCT in the last 72 hours. Urinalysis    Component Value Date/Time   COLORURINE AMBER (A) 08/23/2015 1811   APPEARANCEUR CLOUDY (A) 08/23/2015 1811   LABSPEC 1.020 08/23/2015 1811   PHURINE 5.5 08/23/2015 1811   GLUCOSEU NEGATIVE 08/23/2015 1811   HGBUR LARGE (A) 08/23/2015 1811   BILIRUBINUR SMALL (A) 08/23/2015 1811   KETONESUR NEGATIVE 08/23/2015 1811   PROTEINUR NEGATIVE 08/23/2015 1811   NITRITE NEGATIVE 08/23/2015 1811   LEUKOCYTESUR NEGATIVE 08/23/2015 1811    Sepsis Labs Invalid input(s): PROCALCITONIN,  WBC,  LACTICIDVEN Microbiology No results found for this or any previous visit (from the past 240 hour(s)).   Time coordinating discharge: < 30 minutes  SIGNED:  Marzetta Board, MD  [facility] 04/14/2016, 11:18 AM Pager [phone]  If 7PM-7AM, please contact night-coverage [email or web address] Password [email or web address]

## 2016-04-14 NOTE — Consult Note (Signed)
Admit date: 04/13/2016 Referring Physician  Dr. Cruzita Lederer Primary Physician Mathews Argyle, MD Primary Cardiologist  Gwenlyn Found Reason for Consultation  Chest pain  HPI: 65 year old with CAD (DES x 2 to RCA in 2013, DES of OM and PLA branches 2015 with LHC in 2016 showing patent stents and nonobstructive CAD, carotid artery disease status post left carotid endarterectomy, diabetes currently diet controlled, hypertension, hyperlipidemia who was just seen in the office on 04/04/16 by Dr. Gwenlyn Found who presented to the emergency room with chest discomfort. He had several episodes yesterday left-sided with some radiation to the right arm lasting a few seconds to minutes in duration that did not appear to be exertional. The episodes seemed to be more painful than usual. He felt some cold sweats, potential diaphoresis. However, with most recent ambulation/walking up hills, stairway, [employer] for instance, he has not experienced any anginal discomfort. He therefore went to [facility] urgent care and was then transferred to the emergency department for further evaluation. Since in the emergency room, he was without pain. His chest discomfort seemed to go away without any sort of particular intervention. No shortness of breath, no back pain, no recent fevers. No orthopnea. No syncope. He was started on heparin for potential unstable angina.  Troponins overnight have been normal. EKG is unchanged from prior EKG with very subtle J-point elevation in the inferior leads.  He has not had any further chest episodes. He has trouble discerning what is real chest pain and what is not. He states he once a be proactive and does not want to miss a heart attack. I understand.  PMH:   Past Medical History:  Diagnosis Date  . Carotid artery disease ([place])    a. s/p L CEA  . Coronary artery disease    a. s/p DES x 2 to the RCA (05/2012 at [facility]) b. s/p DES of OM and PLA branches (05/2014)  c. s/p LHC on 10/06/14 with patent stents  and non-obs CAD   . Diabetes mellitus without complication ([place])    a. diet controlled   . GERD (gastroesophageal reflux disease)   . HLD (hyperlipidemia)   . Hypertension   . Obesity   . Syncope    a. 10/2014 - felt to be 2/2 hypotension after recent imdur titration.    PSH:   Past Surgical History:  Procedure Laterality Date  . CAROTID ENDARTERECTOMY    . CORONARY ANGIOPLASTY WITH STENT PLACEMENT    . LEFT HEART CATHETERIZATION WITH CORONARY ANGIOGRAM N/A 06/08/2014   Procedure: LEFT HEART CATHETERIZATION WITH CORONARY ANGIOGRAM;  Surgeon: Blane Ohara, MD;  Location: [facility] CATH LAB;  Service: Cardiovascular;  Laterality: N/A;  . LEFT HEART CATHETERIZATION WITH CORONARY ANGIOGRAM N/A 10/06/2014   Procedure: LEFT HEART CATHETERIZATION WITH CORONARY ANGIOGRAM;  Surgeon: Leonie Man, MD;  Location: [facility] CATH LAB;  Service: Cardiovascular;  Laterality: N/A;   Allergies:  Penicillins Prior to Admit Meds:   Prior to Admission medications   Medication Sig Start Date End Date Taking? Authorizing Provider  aspirin EC 81 MG tablet Take 81 mg by mouth daily.   Yes Historical Provider, MD  atenolol (TENORMIN) 50 MG tablet TAKE 1 TABLET EVERY DAY Patient taking differently: Take 50 mg by mouth once a day 11/19/15  Yes Lorretta Harp, MD  Calcium Carb-Cholecalciferol (CALCIUM 600 + D PO) Take 1 tablet by mouth daily.   Yes Historical Provider, MD  Cholecalciferol (VITAMIN D-3) 1000 units CAPS Take 2,000 Units by mouth daily.  Yes Historical Provider, MD  clopidogrel (PLAVIX) 75 MG tablet TAKE 1 TABLET EVERY DAY Patient taking differently: Take 75 mg by mouth once a day 11/19/15  Yes Lorretta Harp, MD  Coenzyme Q10 (CO Q 10 PO) Take 1 capsule by mouth daily.   Yes Historical Provider, MD  furosemide (LASIX) 20 MG tablet TAKE 1 TABLET EVERY DAY Patient taking differently: Take 20 mg by mouth once a day 03/31/16  Yes Lorretta Harp, MD  ipratropium (ATROVENT) 0.06 % nasal spray Place 1-2  sprays into both nostrils daily as needed for rhinitis.  03/25/16  Yes Historical Provider, MD  isosorbide mononitrate (IMDUR) 60 MG 24 hr tablet TAKE 1 TABLET EVERY DAY Patient taking differently: Take 60 mg by mouth once a day 11/19/15  Yes Lorretta Harp, MD  ketoconazole (NIZORAL) 2 % cream Apply 1 application topically at bedtime. 03/29/16  Yes Historical Provider, MD  losartan (COZAAR) 50 MG tablet Take 50 mg by mouth daily.   Yes Historical Provider, MD  metFORMIN (GLUCOPHAGE-XR) 500 MG 24 hr tablet Take 500 mg by mouth every evening. 03/16/16  Yes Historical Provider, MD  Multiple Vitamins-Minerals (ONE-A-DAY MENS 50+ ADVANTAGE) TABS Take 1 tablet by mouth daily.   Yes Historical Provider, MD  nitroGLYCERIN (NITROSTAT) 0.4 MG SL tablet Place 1 tablet (0.4 mg total) under the tongue every 5 (five) minutes x 3 doses as needed for chest pain. 10/06/14  Yes Eileen Stanford, PA-C  Omega-3 300 MG CAPS Take 900 mg by mouth daily.   Yes Historical Provider, MD  pantoprazole (PROTONIX) 40 MG tablet Take 40 mg daily if needed Patient taking differently: Take 40 mg by mouth every other day.  12/27/15  Yes Lorretta Harp, MD  rosuvastatin (CRESTOR) 20 MG tablet Take 1 tablet (20 mg total) by mouth daily. 12/27/15  Yes Lorretta Harp, MD  vitamin E 600 UNIT capsule Take 600 Units by mouth daily.   Yes Historical Provider, MD    Scheduled Meds: . aspirin EC  81 mg Oral Daily  . atenolol  50 mg Oral Daily  . clopidogrel  75 mg Oral Daily  . furosemide  20 mg Oral Daily  . insulin aspart  0-9 Units Subcutaneous TID WC  . losartan  50 mg Oral Daily  . omega-3 acid ethyl esters  1,000 mg Oral Daily  . pantoprazole  40 mg Oral QODAY  . rosuvastatin  20 mg Oral Daily  . vitamin E  600 Units Oral Daily   Continuous Infusions: . heparin 1,300 Units/hr (04/13/16 1949)  . nitroGLYCERIN 10 mcg/min (04/13/16 1948)   PRN Meds:.acetaminophen, ondansetron (ZOFRAN) IV  Fam HX:    Family History    Problem Relation Age of Onset  . Heart attack Father 62  . COPD Mother 8   Social HX:    Social History   Social History  . Marital status: Divorced    Spouse name: N/A  . Number of children: N/A  . Years of education: N/A   Occupational History  . Owns transportation [place] History Main Topics  . Smoking status: Former Smoker    Quit date: 06/05/2012  . Smokeless tobacco: Never Used  . Alcohol use No  . Drug use: No  . Sexual activity: Not on file   Other Topics Concern  . Not on file   Social History Narrative   Lives alone.     ROS:  All 11 ROS were addressed and  are negative except what is stated in the HPI   Physical Exam: Blood pressure 121/68, pulse 71, temperature 98.2 F (36.8 C), temperature source Oral, resp. rate 14, height 5\' 8"  (1.727 m), weight 210 lb (95.3 kg), SpO2 97 %.   General: Well developed, well nourished, in no acute distress Head: Eyes PERRLA, No xanthomas.   Normal cephalic and atramatic  Lungs:   Clear bilaterally to auscultation and percussion. Normal respiratory effort. No wheezes, no rales. Heart:   HRRR S1 S2 Pulses are 2+ & equal. No murmur, rubs, gallops.  No carotid bruit. No JVD.  No abdominal bruits.  Abdomen: Bowel sounds are positive, abdomen soft and non-tender without masses. No hepatosplenomegaly. Msk:  Back normal. Normal strength and tone for age. Extremities:  No clubbing, cyanosis or edema.  DP +1 Neuro: Alert and oriented X 3, non-focal, MAE x 4 GU: Deferred Rectal: Deferred Psych:  Good affect, responds appropriately      Labs: Lab Results  Component Value Date   WBC 6.2 04/13/2016   HGB 14.3 04/13/2016   HCT 42.8 04/13/2016   MCV 91.1 04/13/2016   PLT 272 04/13/2016     Recent Labs Lab 04/13/16 1640  NA 139  K 4.3  CL 100*  CO2 30  BUN 11  CREATININE 0.95  CALCIUM 9.7  GLUCOSE 133*    Recent Labs  04/13/16 2314 04/14/16 0255  TROPONINI <0.03 <0.03   Lab Results   Component Value Date   CHOL 128 10/06/2014   HDL 29 (L) 10/06/2014   LDLCALC 80 10/06/2014   TRIG 96 10/06/2014   No results found for: DDIMER   Radiology:  Dg Chest 2 View  Result Date: 04/13/2016 CLINICAL DATA:  Dry cough for 1 week, left-sided chest pain with cold sweats EXAM: CHEST  2 VIEW COMPARISON:  10/19/2015 FINDINGS: There is mild hyperinflation. Stable mild elevation of left diaphragm with probable scarring at the left base. No acute consolidation or effusion. Stable cardiomediastinal silhouette with atherosclerosis. No pneumothorax. Surgical clips in the left neck. IMPRESSION: 1. No acute infiltrate or edema 2. Stable slightly elevated left diaphragm with probable basilar scarring 3. Atherosclerosis of the aorta Electronically Signed   By: Donavan Foil M.D.   On: 04/13/2016 18:51   Personally viewed.  EKG:  Sinus rhythm, subtle J-point elevation inferiorly, no ST segment changes. No changes from prior. Personally viewed.   ASSESSMENT/PLAN:    65 year old male with known coronary artery disease, stents as above, left CEA, diet controlled diabetes with hemoglobin A1c of 7.1 from 8.1, hypertension, hyperlipidemia on Crestor here with chest pain with both typical as well as atypical features.  Chest pain  - Thankfully, troponins are normal. EKG reassuring. He has not had any discomfort since being here in the emergency room from yesterday.  - His pain occurred at rest and could have been musculoskeletal/GI related although he thought potentially this was different. He stated that he was staring on the side of caution and did not want to miss a heart attack. I reassured him. No evidence of troponin elevation or ECG changes suggestive of MI.  - He worries that Dr. Gwenlyn Found may think he is a hypochondriac but I reassured him. It is challenging especially with known coronary artery disease to discern whether chest pain is typical or atypical.  - Thankfully most recent cardiac  catheterization showed only minor nonobstructive coronary artery disease with patent stents.  - Based upon his negative troponin, neutral ECG and current lack of  chest pain I feel comfortable allowing him to be discharged home with close follow-up. No further stress testing or catheterization at this time. He is amenable to this plan. Obviously if chest pain becomes more worrisome, further testing may be warranted.  - Continue with current active medication regimen including atenolol, Crestor, isosorbide as antianginals. Instructed to try sublingual nitroglycerin if he does have another episode. Lay down at that time.  Diet-controlled diabetes  - Good job with weight loss, reduction in hemoglobin A1c from 8.1 down to 7.1.  Hyperlipidemia  - Continue with Crestor. Discussed plaque stabilization, anti-inflammatory effects. Encouraged him to continue to use this despite excellent cholesterol.  Essential hypertension  - Currently well controlled. He reminded me that he did have a single episode after taking higher dose isosorbide in cardiac rehabilitation several months ago.  - He may be prone to a vagal-like response hence the light sweats/diaphoresis/clamminess that he was feeling prior to his emergency room visit.  Comfortable with discharge home. We will arrange close follow-up.  Candee Furbish, MD  04/14/2016  7:37 AM

## 2016-04-14 NOTE — Progress Notes (Signed)
ANTICOAGULATION CONSULT NOTE - Follow Up Consult  Pharmacy Consult for heparin Indication: chest pain/ACS   Labs:  Recent Labs  04/13/16 1640 04/13/16 2314 04/14/16 0251  HGB 14.3  --   --   HCT 42.8  --   --   PLT 272  --   --   HEPARINUNFRC  --   --  0.54  CREATININE 0.95  --   --   TROPONINI  --  <0.03  --      Assessment/Plan:  65yo male therapeutic on heparin with initial dosing for CP. Will continue gtt at current rate and confirm stable with additional level.   Wynona Neat, PharmD, BCPS  04/14/2016,3:27 AM

## 2016-04-18 ENCOUNTER — Encounter: Payer: Self-pay | Admitting: Physician Assistant

## 2016-04-18 ENCOUNTER — Ambulatory Visit (INDEPENDENT_AMBULATORY_CARE_PROVIDER_SITE_OTHER): Payer: Commercial Managed Care - HMO | Admitting: Physician Assistant

## 2016-04-18 VITALS — BP 110/78 | HR 76 | Wt 210.8 lb

## 2016-04-18 DIAGNOSIS — E785 Hyperlipidemia, unspecified: Secondary | ICD-10-CM

## 2016-04-18 DIAGNOSIS — Z9861 Coronary angioplasty status: Secondary | ICD-10-CM

## 2016-04-18 DIAGNOSIS — I10 Essential (primary) hypertension: Secondary | ICD-10-CM | POA: Diagnosis not present

## 2016-04-18 DIAGNOSIS — I2510 Atherosclerotic heart disease of native coronary artery without angina pectoris: Secondary | ICD-10-CM

## 2016-04-18 DIAGNOSIS — R072 Precordial pain: Secondary | ICD-10-CM

## 2016-04-18 NOTE — Patient Instructions (Addendum)
Medication Instructions:   Your physician recommends that you continue on your current medications as directed. Please refer to the Current Medication list given to you today.   If you need a refill on your cardiac medications before your next appointment, please call your pharmacy.  Labwork:  NONE ORDERED  TODAY    Testing/Procedures: NONE ORDERED  TODAY   Follow-Up:  Your physician wants you to follow-up in: October 2018 WITH DR Andria Rhein will receive a reminder letter in the mail two months in advance. If you don't receive a letter, please call our office to schedule the follow-up appointment.     Any Other Special Instructions Will Be Listed Below (If Applicable).                                                                                                                                                 '

## 2016-04-18 NOTE — Progress Notes (Signed)
Cardiology Office Note   Date:  04/18/2016   ID:  Reginald Little, DOB [birth year], MRN [id number]  PCP:  Reginald Argyle, MD  Cardiologist:  Dr Reginald Little 04/04/2016 Reginald Ferries, PA-C  06/2015  Chief Complaint  Patient presents with  . Follow-up    hospital    History of Present Illness: Reginald Little is a 65 y.o. male with a history of CAD (DES x 2 to RCA in 2013, DES of OM and PLA branches 2015, LHC in 2016 w/ patent stents and nonobs CAD, carotid artery disease w/ L-CEA, DM w/ diet control, HTN, HLD, GERD  D/c 11/03 after admit for CP, seen by cards and no further testing recommended, just early f/u.  Reginald Little presents for post-hospital f/u.  The chest pain that sponsored his admission was left-sided, just under his left breast. There was an incident where he turned suddenly and heard a pop prior to the onset of chest pain. Since discharge from the hospital, he has had a couple of episodes, but not very much.  The chest pain is not exertional. In fact, when he exerts himself he never feels it. It will start when he is at rest. It reaches approximately a 5/10. When it would start, he will begin to feel anxious about it. He would get diaphoretic and feel short of breath, it may have hurt a little bit more to breathe. Prior to admission, he had a tingling electrical sensation in his right arm and that is what prompted his admission.  The symptoms are totally different from his reflux symptoms. They're not exactly like his pre-PCI symptoms either.  He wonders if the pain is musculoskeletal in origin. He has a tender area at the mid-left sternal/rib junction.  He had labs done recently at the [place] and brings those records with him   Past Medical History:  Diagnosis Date  . Carotid artery disease ([place])    a. s/p L CEA  . Coronary artery disease    a. s/p DES x 2 to the RCA (05/2012 at [facility]) b. s/p DES of OM and PLA branches (05/2014)  c. s/p LHC on 10/06/14 with patent stents  and non-obs CAD   . Diabetes mellitus without complication ([place])    a. diet controlled   . GERD (gastroesophageal reflux disease)   . HLD (hyperlipidemia)   . Hypertension   . Obesity   . Syncope    a. 10/2014 - felt to be 2/2 hypotension after recent imdur titration.    Past Surgical History:  Procedure Laterality Date  . CAROTID ENDARTERECTOMY    . CORONARY ANGIOPLASTY WITH STENT PLACEMENT    . LEFT HEART CATHETERIZATION WITH CORONARY ANGIOGRAM N/A 06/08/2014   Procedure: LEFT HEART CATHETERIZATION WITH CORONARY ANGIOGRAM;  Surgeon: Reginald Ohara, MD;  Location: [facility] CATH LAB;  Service: Cardiovascular;  Laterality: N/A;  . LEFT HEART CATHETERIZATION WITH CORONARY ANGIOGRAM N/A 10/06/2014   Procedure: LEFT HEART CATHETERIZATION WITH CORONARY ANGIOGRAM;  Surgeon: Reginald Man, MD;  Location: [facility] CATH LAB;  Service: Cardiovascular;  Laterality: N/A;    Current Outpatient Prescriptions  Medication Sig Dispense Refill  . aspirin EC 81 MG tablet Take 81 mg by mouth daily.    Marland Kitchen atenolol (TENORMIN) 50 MG tablet Take 75 tablets by mouth daily. 50mg  in the AM and 25mg  in the PM    . Calcium Carb-Cholecalciferol (CALCIUM 600 + D PO) Take 1 tablet by mouth daily.    . Cholecalciferol (VITAMIN D-3) 1000 units  CAPS Take 2,000 Units by mouth daily.    . clopidogrel (PLAVIX) 75 MG tablet Take 75 mg by mouth daily.    . Coenzyme Q10 (CO Q 10 PO) Take 1 capsule by mouth daily.    . furosemide (LASIX) 20 MG tablet Take 20 mg by mouth daily.    Marland Kitchen ipratropium (ATROVENT) 0.06 % nasal spray Place 1-2 sprays into both nostrils daily as needed for rhinitis.     Marland Kitchen isosorbide mononitrate (IMDUR) 60 MG 24 hr tablet Take 60 mg by mouth daily.    Marland Kitchen ketoconazole (NIZORAL) 2 % cream Apply 1 application topically at bedtime.    Marland Kitchen losartan (COZAAR) 100 MG tablet Take 100 mg by mouth daily. 50mg  in the AM  and 50mg  in the PM    . metFORMIN (GLUCOPHAGE-XR) 500 MG 24 hr tablet Take 500 mg by mouth every evening.      . Multiple Vitamins-Minerals (ONE-A-DAY MENS 50+ ADVANTAGE) TABS Take 1 tablet by mouth daily.    . nitroGLYCERIN (NITROSTAT) 0.4 MG SL tablet Place 1 tablet (0.4 mg total) under the tongue every 5 (five) minutes x 3 doses as needed for chest pain. 25 tablet 12  . Omega-3 300 MG CAPS Take 900 mg by mouth daily.    . pantoprazole (PROTONIX) 40 MG tablet Take 40 mg by mouth daily.    . rosuvastatin (CRESTOR) 20 MG tablet Take 1 tablet (20 mg total) by mouth daily. 90 tablet 2  . vitamin E 600 UNIT capsule Take 600 Units by mouth daily.     No current facility-administered medications for this visit.     Allergies:   Penicillins    Social History:  The patient  reports that he quit smoking about 3 years ago. He has never used smokeless tobacco. He reports that he does not drink alcohol or use drugs.   Family History:  The patient's family history includes COPD (age of onset: 79) in his mother; Heart attack (age of onset: 68) in his father.    ROS:  Please see the history of present illness. All other systems are reviewed and negative.    PHYSICAL EXAM: VS:  BP 110/78 (BP Location: Right Arm, Cuff Size: Normal)   Pulse 76   Wt 210 lb 12.8 oz (95.6 kg)   BMI 32.05 kg/m  , BMI Body mass index is 32.05 kg/m. GEN: Well nourished, well developed, male in no acute distress  HEENT: normal for age  Neck: no JVD, no carotid bruit, CEA scar on the left, no masses Cardiac: RRR; no murmur, no rubs, or gallops Respiratory:  clear to auscultation bilaterally, normal work of breathing; chest walls tenderness noted at the mid left sternocostal junction GI: soft, nontender, nondistended, + BS MS: no deformity or atrophy; no edema; distal pulses are 2+ in all 4 extremities   Skin: warm and dry, no rash Neuro:  Strength and sensation are intact Psych: euthymic mood, full affect   EKG:  EKG is not ordered today   Recent Labs: 04/13/2016: BUN 11; Creatinine, Ser 0.95; Hemoglobin 14.3; Platelets  272; Potassium 4.3; Sodium 139    Lipid Panel    Component Value Date/Time   CHOL 128 10/06/2014 0505   TRIG 96 10/06/2014 0505   HDL 29 (L) 10/06/2014 0505   CHOLHDL 4.4 10/06/2014 0505   VLDL 19 10/06/2014 0505   LDLCALC 80 10/06/2014 0505     Wt Readings from Last 3 Encounters:  04/18/16 210 lb 12.8 oz (95.6 kg)  04/14/16 210 lb (95.3 kg)  04/04/16 213 lb (96.6 kg)     Other studies Reviewed: Additional studies/ records that were reviewed today include: Office as, hospital records and [place] records.  ASSESSMENT AND PLAN:  1.  Chest pain: I reiterated that no further testing is needed. I discussed the fact that the chest pain makes him anxious, and that makes his symptoms worse. He is in agreement with this. After discussion with him, the most likely cause of his pain is musculoskeletal. He is encouraged to treat that appropriately with Tylenol, or other over-the-counter medications.  2. CAD: He is encouraged to increase his activity which he says he will do. He is not having any ischemic symptoms with activity.  3. Hyperlipidemia: He has records from the [place] with him today, his HDL was 37 and his C-LDL was 37 also. Continue Crestor  4. Hypertension: His blood pressure is well controlled. No med changes.   Current medicines are reviewed at length with the patient today.  The patient does not have concerns regarding medicines.  The following changes have been made:  no change  Labs/ tests ordered today include:  No orders of the defined types were placed in this encounter.    Disposition:   FU with Dr. Gwenlyn Little as scheduled  Signed, Lenoard Aden  04/18/2016 9:51 AM    [place] Phone: [phone]; Fax: [phone]  This note was written with the assistance of speech recognition software. Please excuse any transcriptional errors.

## 2016-04-19 DIAGNOSIS — I10 Essential (primary) hypertension: Secondary | ICD-10-CM | POA: Diagnosis not present

## 2016-04-19 DIAGNOSIS — R079 Chest pain, unspecified: Secondary | ICD-10-CM | POA: Diagnosis not present

## 2016-05-08 ENCOUNTER — Encounter (HOSPITAL_COMMUNITY): Payer: Self-pay

## 2016-05-08 ENCOUNTER — Emergency Department (HOSPITAL_COMMUNITY)
Admission: EM | Admit: 2016-05-08 | Discharge: 2016-05-08 | Disposition: A | Discharge status: Home / Self Care | Payer: Commercial Managed Care - HMO | Attending: Emergency Medicine | Admitting: Emergency Medicine

## 2016-05-08 ENCOUNTER — Other Ambulatory Visit: Payer: Self-pay

## 2016-05-08 DIAGNOSIS — Z955 Presence of coronary angioplasty implant and graft: Secondary | ICD-10-CM | POA: Insufficient documentation

## 2016-05-08 DIAGNOSIS — E119 Type 2 diabetes mellitus without complications: Secondary | ICD-10-CM | POA: Diagnosis not present

## 2016-05-08 DIAGNOSIS — Z7984 Long term (current) use of oral hypoglycemic drugs: Secondary | ICD-10-CM | POA: Insufficient documentation

## 2016-05-08 DIAGNOSIS — Z7982 Long term (current) use of aspirin: Secondary | ICD-10-CM | POA: Diagnosis not present

## 2016-05-08 DIAGNOSIS — R002 Palpitations: Secondary | ICD-10-CM | POA: Insufficient documentation

## 2016-05-08 DIAGNOSIS — I10 Essential (primary) hypertension: Secondary | ICD-10-CM | POA: Diagnosis present

## 2016-05-08 DIAGNOSIS — Z87891 Personal history of nicotine dependence: Secondary | ICD-10-CM | POA: Diagnosis not present

## 2016-05-08 DIAGNOSIS — Z79899 Other long term (current) drug therapy: Secondary | ICD-10-CM | POA: Insufficient documentation

## 2016-05-08 DIAGNOSIS — I2510 Atherosclerotic heart disease of native coronary artery without angina pectoris: Secondary | ICD-10-CM | POA: Insufficient documentation

## 2016-05-08 NOTE — ED Provider Notes (Signed)
[facility] DEPT Provider Note   CSN: [id number] Arrival date & time: 05/08/16  [id number]     History   Chief Complaint Chief Complaint  Patient presents with  . Hypertension    HPI Reginald Little is a 65 y.o. male.  HPI Reginald Little is a 65 y.o. male with PMH significant for CAD s/p DES, DM, HLD, GERD, HTN who presents with elevated blood pressure concern. Patient states last night about 10:30 pm he felt like his heart was racing.  He took a SL nitro and went to bed.  He then woke up at 4:30 AM and checked his BP and found it to be 200/100s, he then took his BP medications and it was 180-190s/100s.  He states at that time he felt mildly diaphoretic and nauseous, like he had indigestion.  This has since resolved.  No CP, SOB, syncope, dizziness, vomiting, or any other symptoms.    Cardiologist: Dr. Gwenlyn Found PCP: Felipa Eth  Past Medical History:  Diagnosis Date  . Carotid artery disease ([place])    a. s/p L CEA  . Coronary artery disease    a. s/p DES x 2 to the RCA (05/2012 at [facility]) b. s/p DES of OM and PLA branches (05/2014)  c. s/p LHC on 10/06/14 with patent stents and non-obs CAD   . Diabetes mellitus without complication ([place])    a. diet controlled   . GERD (gastroesophageal reflux disease)   . HLD (hyperlipidemia)   . Hypertension   . Obesity   . Syncope    a. 10/2014 - felt to be 2/2 hypotension after recent imdur titration.    Patient Active Problem List   Diagnosis Date Noted  . GERD (gastroesophageal reflux disease)   . Coronary artery disease   . Hypertension   . Syncope 10/21/2014  . Dyspnea on exertion   . CAD S/P percutaneous coronary angioplasty   . Exertional angina (HCC)   . Hyperlipidemia 08/18/2014  . Carotid artery disease ([place]) 08/18/2014  . Claudication ([place]) 08/18/2014  . Left hip pain 07/07/2014  . Essential hypertension 07/07/2014  . Obesity (BMI 30.0-34.9) 07/07/2014  . Chest pain 06/06/2014    Past Surgical History:  Procedure Laterality Date    . CAROTID ENDARTERECTOMY    . CORONARY ANGIOPLASTY WITH STENT PLACEMENT    . LEFT HEART CATHETERIZATION WITH CORONARY ANGIOGRAM N/A 06/08/2014   Procedure: LEFT HEART CATHETERIZATION WITH CORONARY ANGIOGRAM;  Surgeon: Blane Ohara, MD;  Location: [facility] CATH LAB;  Service: Cardiovascular;  Laterality: N/A;  . LEFT HEART CATHETERIZATION WITH CORONARY ANGIOGRAM N/A 10/06/2014   Procedure: LEFT HEART CATHETERIZATION WITH CORONARY ANGIOGRAM;  Surgeon: Leonie Man, MD;  Location: [facility] CATH LAB;  Service: Cardiovascular;  Laterality: N/A;       Home Medications    Prior to Admission medications   Medication Sig Start Date End Date Taking? Authorizing Provider  aspirin EC 81 MG tablet Take 81 mg by mouth daily.    Historical Provider, MD  atenolol (TENORMIN) 50 MG tablet Take 75 tablets by mouth daily. 50mg  in the AM and 25mg  in the PM 04/04/16   Historical Provider, MD  Calcium Carb-Cholecalciferol (CALCIUM 600 + D PO) Take 1 tablet by mouth daily.    Historical Provider, MD  Cholecalciferol (VITAMIN D-3) 1000 units CAPS Take 2,000 Units by mouth daily.    Historical Provider, MD  clopidogrel (PLAVIX) 75 MG tablet Take 75 mg by mouth daily.    Historical Provider, MD  Coenzyme Q10 (CO Q 10  PO) Take 1 capsule by mouth daily.    Historical Provider, MD  furosemide (LASIX) 20 MG tablet Take 20 mg by mouth daily.    Historical Provider, MD  ipratropium (ATROVENT) 0.06 % nasal spray Place 1-2 sprays into both nostrils daily as needed for rhinitis.  03/25/16   Historical Provider, MD  isosorbide mononitrate (IMDUR) 60 MG 24 hr tablet Take 60 mg by mouth daily.    Historical Provider, MD  ketoconazole (NIZORAL) 2 % cream Apply 1 application topically at bedtime. 03/29/16   Historical Provider, MD  losartan (COZAAR) 100 MG tablet Take 100 mg by mouth daily. 50mg  in the AM  and 50mg  in the PM 04/04/16   Historical Provider, MD  metFORMIN (GLUCOPHAGE-XR) 500 MG 24 hr tablet Take 500 mg by mouth every  evening. 03/16/16   Historical Provider, MD  Multiple Vitamins-Minerals (ONE-A-DAY MENS 50+ ADVANTAGE) TABS Take 1 tablet by mouth daily.    Historical Provider, MD  nitroGLYCERIN (NITROSTAT) 0.4 MG SL tablet Place 1 tablet (0.4 mg total) under the tongue every 5 (five) minutes x 3 doses as needed for chest pain. 10/06/14   Eileen Stanford, PA-C  Omega-3 300 MG CAPS Take 900 mg by mouth daily.    Historical Provider, MD  pantoprazole (PROTONIX) 40 MG tablet Take 40 mg by mouth daily.    Historical Provider, MD  rosuvastatin (CRESTOR) 20 MG tablet Take 1 tablet (20 mg total) by mouth daily. 12/27/15   Lorretta Harp, MD  vitamin E 600 UNIT capsule Take 600 Units by mouth daily.    Historical Provider, MD    Family History Family History  Problem Relation Age of Onset  . Heart attack Father 20  . COPD Mother 36    Social History Social History  Substance Use Topics  . Smoking status: Former Smoker    Quit date: 06/05/2012  . Smokeless tobacco: Never Used  . Alcohol use No     Allergies   Penicillins   Review of Systems Review of Systems All other systems negative unless otherwise stated in HPI   Physical Exam Updated Vital Signs BP 155/73 (BP Location: Left Arm)   Pulse 74   Temp 97.9 F (36.6 C) (Oral)   Resp 16   Ht 5' 8.5" (1.74 m)   Wt 92.1 kg   SpO2 99%   BMI 30.42 kg/m   Physical Exam  Constitutional: He is oriented to person, place, and time. He appears well-developed and well-nourished.  Non-toxic appearance. He does not have a sickly appearance. He does not appear ill.  HENT:  Head: Normocephalic and atraumatic.  Mouth/Throat: Oropharynx is clear and moist.  Eyes: Conjunctivae are normal.  Neck: Normal range of motion. Neck supple.  Cardiovascular: Normal rate and regular rhythm.   Pulmonary/Chest: Effort normal and breath sounds normal. No accessory muscle usage or stridor. No respiratory distress. He has no wheezes. He has no rhonchi. He has no  rales.  Abdominal: Soft. Bowel sounds are normal. He exhibits no distension. There is no tenderness.  Musculoskeletal: Normal range of motion.  Lymphadenopathy:    He has no cervical adenopathy.  Neurological: He is alert and oriented to person, place, and time.  Speech clear without dysarthria.  Skin: Skin is warm and dry.  Psychiatric: He has a normal mood and affect. His behavior is normal.     ED Treatments / Results  Labs (all labs ordered are listed, but only abnormal results are displayed) Labs Reviewed - No  data to display  EKG  EKG Interpretation  Date/Time:  Monday May 08 2016 08:40:07 EST Ventricular Rate:  71 PR Interval:  170 QRS Duration: 88 QT Interval:  400 QTC Calculation: 434 R Axis:   37 Text Interpretation:  Normal sinus rhythm Normal ECG No acute changes No significant change since last tracing Confirmed by Kathrynn Humble, MD, Thelma Comp [phone]) on 05/08/2016 8:49:20 AM       Radiology No results found.  Procedures Procedures (including critical care time)  Medications Ordered in ED Medications - No data to display   Initial Impression / Assessment and Plan / ED Course  I have reviewed the triage vital signs and the nursing notes.  Pertinent labs & imaging results that were available during my care of the patient were reviewed by me and considered in my medical decision making (see chart for details).  Clinical Course    Patient presents with concern of elevated blood pressure and episode of palpitations last night.  He denies any symptoms currently.  He sees Dr. Gwenlyn Found, cardiology.  Recent cath April 2016 showed patent stents with nonobstructing CAD.  He was seen by their office 04/18/16 regarding very similar chest pain, they did not recommend further testing and suspicious for musculoskeletal etiology.  EKG NSR.  He is asymptomatic, and no further labs or imaging are warranted at this time.  BP has been stable throughout ED stay.  Patient seen by Dr.  Kathrynn Humble as well.  Discussed possible holter monitoring for further evaluation.  At this time, I have low suspicion for ACS or other emergent condition.  Follow up PCP.  Return precautions discussed.  Stable for discharge.     Final Clinical Impressions(s) / ED Diagnoses   Final diagnoses:  Palpitations    New Prescriptions Discharge Medication List as of 05/08/2016  9:10 AM       Gloriann Loan, PA-C 05/08/16 [place], MD 05/13/16 1037

## 2016-05-08 NOTE — Discharge Instructions (Signed)
Your EKG today is normal.  Please follow up with your PCP for possible holter monitoring for further evaluation.  Return to the ED for sudden worsening chest pain, shortness of breath, dizziness, or any other new or concerning symptoms.

## 2016-05-08 NOTE — ED Notes (Signed)
Declined W/C at D/C and was escorted to lobby by RN. 

## 2016-05-08 NOTE — ED Triage Notes (Signed)
Pt here for HTN , sts took it several times at home and had elevated readings.

## 2016-05-09 ENCOUNTER — Encounter: Payer: Self-pay | Admitting: Physician Assistant

## 2016-05-10 DIAGNOSIS — I10 Essential (primary) hypertension: Secondary | ICD-10-CM | POA: Diagnosis not present

## 2016-05-10 DIAGNOSIS — E669 Obesity, unspecified: Secondary | ICD-10-CM | POA: Diagnosis not present

## 2016-05-10 DIAGNOSIS — Z6832 Body mass index (BMI) 32.0-32.9, adult: Secondary | ICD-10-CM | POA: Diagnosis not present

## 2016-06-06 ENCOUNTER — Emergency Department (HOSPITAL_COMMUNITY)
Admission: EM | Admit: 2016-06-06 | Discharge: 2016-06-07 | Disposition: A | Discharge status: Home / Self Care | Payer: Commercial Managed Care - HMO | Attending: Emergency Medicine | Admitting: Emergency Medicine

## 2016-06-06 ENCOUNTER — Telehealth: Payer: Self-pay | Admitting: Cardiovascular Disease

## 2016-06-06 ENCOUNTER — Encounter (HOSPITAL_COMMUNITY): Payer: Self-pay | Admitting: Emergency Medicine

## 2016-06-06 ENCOUNTER — Emergency Department (HOSPITAL_COMMUNITY): Payer: Commercial Managed Care - HMO

## 2016-06-06 DIAGNOSIS — Z7982 Long term (current) use of aspirin: Secondary | ICD-10-CM | POA: Insufficient documentation

## 2016-06-06 DIAGNOSIS — Z79899 Other long term (current) drug therapy: Secondary | ICD-10-CM | POA: Insufficient documentation

## 2016-06-06 DIAGNOSIS — Z87891 Personal history of nicotine dependence: Secondary | ICD-10-CM | POA: Diagnosis not present

## 2016-06-06 DIAGNOSIS — I10 Essential (primary) hypertension: Secondary | ICD-10-CM | POA: Diagnosis not present

## 2016-06-06 DIAGNOSIS — Z955 Presence of coronary angioplasty implant and graft: Secondary | ICD-10-CM | POA: Diagnosis not present

## 2016-06-06 DIAGNOSIS — R0789 Other chest pain: Secondary | ICD-10-CM | POA: Diagnosis not present

## 2016-06-06 DIAGNOSIS — Z7984 Long term (current) use of oral hypoglycemic drugs: Secondary | ICD-10-CM | POA: Insufficient documentation

## 2016-06-06 DIAGNOSIS — R079 Chest pain, unspecified: Secondary | ICD-10-CM

## 2016-06-06 DIAGNOSIS — E119 Type 2 diabetes mellitus without complications: Secondary | ICD-10-CM | POA: Diagnosis not present

## 2016-06-06 DIAGNOSIS — I2510 Atherosclerotic heart disease of native coronary artery without angina pectoris: Secondary | ICD-10-CM | POA: Diagnosis not present

## 2016-06-06 LAB — BASIC METABOLIC PANEL
ANION GAP: 11 (ref 5–15)
BUN: 18 mg/dL (ref 6–20)
CALCIUM: 9.6 mg/dL (ref 8.9–10.3)
CHLORIDE: 98 mmol/L — AB (ref 101–111)
CO2: 30 mmol/L (ref 22–32)
CREATININE: 1.19 mg/dL (ref 0.61–1.24)
GFR calc non Af Amer: >60 mL/min (ref >60)
Glucose, Bld: 157 mg/dL — ABNORMAL HIGH (ref 65–99)
Potassium: 4 mmol/L (ref 3.5–5.1)
SODIUM: 139 mmol/L (ref 135–145)

## 2016-06-06 LAB — I-STAT TROPONIN, ED: TROPONIN I, POC: 0 ng/mL (ref 0.00–0.08)

## 2016-06-06 LAB — CBC WITH DIFFERENTIAL/PLATELET
BASOS PCT: 1 %
Basophils Absolute: 0.1 10*3/uL (ref 0.0–0.1)
EOS ABS: 0.4 10*3/uL (ref 0.0–0.7)
EOS PCT: 6 %
HCT: 44.8 % (ref 39.0–52.0)
HEMOGLOBIN: 15.1 g/dL (ref 13.0–17.0)
Lymphocytes Relative: 29 %
Lymphs Abs: 2.1 10*3/uL (ref 0.7–4.0)
MCH: 30.5 pg (ref 26.0–34.0)
MCHC: 33.7 g/dL (ref 30.0–36.0)
MCV: 90.5 fL (ref 78.0–100.0)
MONOS PCT: 6 %
Monocytes Absolute: 0.4 10*3/uL (ref 0.1–1.0)
NEUTROS PCT: 58 %
Neutro Abs: 4.2 10*3/uL (ref 1.7–7.7)
PLATELETS: 269 10*3/uL (ref 150–400)
RBC: 4.95 MIL/uL (ref 4.22–5.81)
RDW: 12.7 % (ref 11.5–15.5)
WBC: 7.3 10*3/uL (ref 4.0–10.5)

## 2016-06-06 NOTE — Telephone Encounter (Signed)
Late entry Spoke to patient @ 1:30 pm today  States chest pain off and on since Friday No active chest pain  , patient states he used ntg with relief. Patient states he has been to er x 3  Without any changes. RN suggest patient to go to ER IF MORE CHEST PAIN OTHER WISE APPT tomorrow with eng at 10 :30 am

## 2016-06-06 NOTE — Progress Notes (Signed)
Cardiology Office Note    Date:  06/07/2016   ID:  Reginald Little, DOB 1950-09-23, MRN [id number]  PCP:  Mathews Argyle, MD  Cardiologist:  Dr. Gwenlyn Found  Chief Complaint  Patient presents with  . Follow-up    seen for Dr. Gwenlyn Found for recurrent chest pain    History of Present Illness:  Reginald Little is a 65 y.o. male with PMH of CAD (DES x 2 to RCA in 2013, DES of OM and PLA branches 2015, LHC in 2016 w/ patent stents and nonobs CAD), carotid artery disease w/ L-CEA in [place] several years ago, diet controlled DM, HTN, HLD and GERD. Last echocardiogram obtained on 06/06/2014 showed EF 0000000, grade 1 diastolic dysfunction. His last Myoview obtained on 06/07/2014 showed EF 59%, mixed pattern of reversible or nonreversible decreased myocardial perfusion involving the inferior and inferolateral wall, moderate risk stress test finding. He ended up having 2.25 x 12 mm Promus DES placed in PLA 1 and 2.5 x 12 mm Promus DES placed in OM 2 by Dr. Burt Knack on 06/08/2014. He will return in April 2016 for another cardiac catheterization which did not show significant culprit lesion to explain his symptoms. He has had multiple visits recently to the emergency room for chest pain.   He was seen by Dr. Gwenlyn Found on 04/04/2016 for atypical chest pain which was infrequent the time, no further workup was felt to be needed. He was later admitted to the hospital on 04/14/2016 for chest pain again, troponin was normal. EKG was unchanged. He was discharged on medical therapy. He was seen in the clinic on 12/17/2015 this post hospital follow-up, at which time the area was he reiterated that no further testing is needed at the time. His chest discomfort was felt to be likely musculoskeletal. Since then he has returned to the emergency room twice. The first time he went back to the ED on 05/08/2016 for elevated blood pressure around [id number] systolic. He went back to the emergency room last night for recurrent chest discomfort.  Point-of-care troponin was negative 2 and EKG was negative for ischemic changes. He was discharged to follow-up with cardiology service on the same day.  He presents today for evaluation of chest pain. I have reviewed his EKG and recent troponin which were reassuring. Unfortunately, this is the second ED visit in the last month alone, prior to that, he had a hospitalization roughly 2 months ago for chest pain. He has been having recurrent chest discomfort since. He described the discomfort as a left sided localized chest pain lasting from seconds up to several minutes at a time. He says yesterday it was a little bit sore afterward. He also mention the characteristic of chest discomfort is very similar to his previous angina prior to his first 2 stents at [facility]. We discussed various options including observation or stress test, he eventually agreed to proceed with treadmill nuclear stress test for further evaluation given the frequency of recurrent symptom. If workup is negative, then he can follow-up in one year.   Past Medical History:  Diagnosis Date  . Carotid artery disease ([place])    a. s/p L CEA  . Coronary artery disease    a. s/p DES x 2 to the RCA (05/2012 at [facility]) b. s/p DES of OM and PLA branches (05/2014)  c. s/p LHC on 10/06/14 with patent stents and non-obs CAD   . Diabetes mellitus without complication ([place])    a. diet controlled   . GERD (gastroesophageal reflux  disease)   . HLD (hyperlipidemia)   . Hypertension   . Obesity   . Syncope    a. 10/2014 - felt to be 2/2 hypotension after recent imdur titration.    Past Surgical History:  Procedure Laterality Date  . CAROTID ENDARTERECTOMY    . CORONARY ANGIOPLASTY WITH STENT PLACEMENT    . LEFT HEART CATHETERIZATION WITH CORONARY ANGIOGRAM N/A 06/08/2014   Procedure: LEFT HEART CATHETERIZATION WITH CORONARY ANGIOGRAM;  Surgeon: Blane Ohara, MD;  Location: [facility] CATH LAB;  Service: Cardiovascular;  Laterality: N/A;  . LEFT HEART  CATHETERIZATION WITH CORONARY ANGIOGRAM N/A 10/06/2014   Procedure: LEFT HEART CATHETERIZATION WITH CORONARY ANGIOGRAM;  Surgeon: Leonie Man, MD;  Location: [facility] CATH LAB;  Service: Cardiovascular;  Laterality: N/A;    Current Medications: Outpatient Medications Prior to Visit  Medication Sig Dispense Refill  . aspirin EC 81 MG tablet Take 81 mg by mouth daily.    Marland Kitchen atenolol (TENORMIN) 50 MG tablet Take 50 mg by mouth 2 (two) times daily.     . Calcium Carb-Cholecalciferol (CALCIUM 600 + D PO) Take 1 tablet by mouth daily.    . Cholecalciferol (VITAMIN D-3) 1000 units CAPS Take 2,000 Units by mouth daily.    . clopidogrel (PLAVIX) 75 MG tablet Take 75 mg by mouth daily.    . Coenzyme Q10 (CO Q 10 PO) Take 1 capsule by mouth daily.    Marland Kitchen desonide (DESONATE) 0.05 % gel Apply 1 application topically daily as needed (irritation).    . furosemide (LASIX) 20 MG tablet Take 20 mg by mouth daily.    Marland Kitchen ipratropium (ATROVENT) 0.06 % nasal spray Place 1-2 sprays into both nostrils daily as needed for rhinitis.     Marland Kitchen isosorbide mononitrate (IMDUR) 60 MG 24 hr tablet Take 60 mg by mouth daily.    Marland Kitchen ketoconazole (NIZORAL) 2 % cream Apply 1 application topically daily as needed for irritation.     Marland Kitchen losartan (COZAAR) 100 MG tablet Take 50 mg by mouth 2 (two) times daily. 50mg  in the AM  and 50mg  in the PM    . Multiple Vitamins-Minerals (ONE-A-DAY MENS 50+ ADVANTAGE) TABS Take 1 tablet by mouth daily.    . nitroGLYCERIN (NITROSTAT) 0.4 MG SL tablet Place 1 tablet (0.4 mg total) under the tongue every 5 (five) minutes x 3 doses as needed for chest pain. 25 tablet 12  . Omega-3 300 MG CAPS Take 900 mg by mouth daily.    . pantoprazole (PROTONIX) 40 MG tablet Take 40 mg by mouth every other day.     . rosuvastatin (CRESTOR) 20 MG tablet Take 1 tablet (20 mg total) by mouth daily. 90 tablet 2  . sitaGLIPtin (JANUVIA) 100 MG tablet Take 100 mg by mouth daily.    . vitamin E 1000 UNIT capsule Take 1,000 Units  by mouth daily.     No facility-administered medications prior to visit.      Allergies:   Imdur [isosorbide nitrate] and Penicillins   Social History   Social History  . Marital status: Divorced    Spouse name: N/A  . Number of children: N/A  . Years of education: N/A   Occupational History  . 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 Smoking status: Former Smoker    Quit date: 06/05/2012  . Smokeless tobacco: Never Used  . Alcohol use No  . Drug use: No  . Sexual activity: Not Asked   Other  Topics Concern  . None   Social History Narrative   Lives alone.     Family History:  The patient's family history includes COPD (age of onset: 47) in his mother; Heart attack (age of onset: 34) in his father.   ROS:   Please see the history of present illness.    ROS All other systems reviewed and are negative.   PHYSICAL EXAM:   VS:  BP 120/68   Pulse 88   Ht 5' 8.5" (1.74 m)   Wt 195 lb (88.5 kg)   BMI 29.22 kg/m    GEN: Well nourished, well developed, in no acute distress  HEENT: normal  Neck: no JVD, carotid bruits, or masses Cardiac: RRR; no murmurs, rubs, or gallops,no edema  Respiratory:  clear to auscultation bilaterally, normal work of breathing GI: soft, nontender, nondistended, + BS MS: no deformity or atrophy  Skin: warm and dry, no rash Neuro:  Alert and Oriented x 3, Strength and sensation are intact Psych: euthymic mood, full affect  Wt Readings from Last 3 Encounters:  06/07/16 195 lb (88.5 kg)  05/08/16 203 lb (92.1 kg)  04/18/16 210 lb 12.8 oz (95.6 kg)      Studies/Labs Reviewed:   EKG:  EKG is not ordered today since he just had EKG obtain in ED this morning  Recent Labs: 06/06/2016: BUN 18; Creatinine, Ser 1.19; Hemoglobin 15.1; Platelets 269; Potassium 4.0; Sodium 139   Lipid Panel    Component Value Date/Time   CHOL 128 10/06/2014 0505   TRIG 96 10/06/2014 0505   HDL 29 (L) 10/06/2014 0505    CHOLHDL 4.4 10/06/2014 0505   VLDL 19 10/06/2014 0505   LDLCALC 80 10/06/2014 0505    Additional studies/ records that were reviewed today include:   Echo 05/27/2014 LV EF: 55% -  60%  ------------------------------------------------------------------- Indications:   Chest pain 786.51.  ------------------------------------------------------------------- History:  PMH: Former Smoker, GERD, 2 stents placed in 2013. Coronary artery disease. Angina pectoris.  ------------------------------------------------------------------- Study Conclusions  - Left ventricle: The cavity size was normal. Wall thickness was increased in a pattern of mild LVH. Systolic function was normal. The estimated ejection fraction was in the range of 55% to 60%. Wall motion was normal; there were no regional wall motion abnormalities. Doppler parameters are consistent with abnormal left ventricular relaxation (grade 1 diastolic dysfunction). Doppler parameters are consistent with high ventricular filling pressure. - Aortic root: The aortic root was mildly dilated. - Mitral valve: Calcified annulus.  Impressions:  - Normal LV function; grade 1 diastolic dysfuntion; mild LVH; mildly dilated aortic root.    Myoview 05/28/2014 IMPRESSION: 1. Mixed pattern of reversible and non reversible decreased myocardial perfusion involving the inferior and inferolateral walls of the LEFT ventricle as above.  2. Dyskinetic appearing inferior wall LEFT ventricle.  3. Left ventricular ejection fraction 59%%  4. Moderate-risk stress test findings*.    Cath 06/08/2014 PCI Data: Lesion 1: Vessel - PLA 1 Percent Stenosis (pre)  90 TIMI-flow 3 Stent 2.25x12 mm Promus DES Percent Stenosis (post) 0 TIMI-flow (post) 3  Lesion 2: Vessel - OM2 Percent Stenosis (pre)  90 TIMI-flow 3 Stent 2.25x12 mm Promus DES Percent Stenosis (post) 0 TIMI-flow (post) 3  Radiation dose/Fluoro  time: 31.4 minutes  Estimated Blood Loss: minimal  Final Conclusions:   1. Two-vessel coronary artery disease with successful PCI of the obtuse marginal and PLA branches. 2. Continued patency of the RCA stents with mild in-stent restenosis 3. Nonobstructive LAD stenosis 4. Normal  LV function by echo assessment   Recommendations:  Continue dual antiplatelet therapy with aspirin and Plavix for another 12 months.    Cath 10/06/2014 Coronary Anatomy:  Dominance: Right   Left Main: Large caliber, long mainstem with a actual septal perforator before trifurcating into the LAD, Ramus Intermedius and Circumflex. Angiographically normal.  LAD: Normal caliber vessel that is patent to the apex. There are mild diffuse irregularities with a roughly 40% focal lesion in the mid vessel. No high-grade lesions noted. There are 3 diagonal branches that are at least moderate caliber with minimal luminal irregularities.  Left Circumflex: Normal caliber, nondominant vessel with 2 obtuse marginal branches. First OM is patent and a second OM are widely patent proximal stent. The remainder of both OM vessels are free of disease. The small AV groove branch and terminates as a small posterolateral artery with no significant disease.   OM1: small-moderate caliber vessel; angiographic normal.   OM 2: small-moderate-caliber vessel with widely patent proximal stent.  . Ramus intermedius: moderate large-caliber vessel that bifurcates in the mid vessel into 2 small moderate caliber branches. Mild luminal irregularities    RCA: Large-caliber, dominant vessel with a high, anterior origin. The vessel is extremely tortuous with at least 3 major bends. It is heavily stented throughout the entire mid segment with mild in-stent stenosis of roughly 30-40%. The vessel bifurcates distally into the RPDA and the  Right Posterior AV Groove Branch (RPAV)  RPDA: Moderate caliber vessel. Angiographically normal.  RPL  Sysytem:The RPAV begins as a small moderate caliber vessel it bifurcates into 2 posterolateral branches. There is a widely patent stent from the RPAV into RPL 1. No problems with flow in the jailed RPL 2. POST-OPERATIVE DIAGNOSIS:    2+ vessel disease with patent stents throughout the entire mid RCA and posterolateral branch of the RCA as well as OM 2.  Preserved LVEF with moderately elevated LVEDP despite having normal systolic pressures.  Mild (5-10 mm) gradient across aortic valve  No angiographically significant lesion to explain the patient's presentation. Consider possible microvascular ischemia with diastolic dysfunction.    ASSESSMENT:    1. Chest pain, unspecified type   2. Coronary artery disease involving native coronary artery of native heart without angina pectoris   3. Essential hypertension   4. Hyperlipidemia, unspecified hyperlipidemia type   5. Controlled type 2 diabetes mellitus with complication, without long-term current use of insulin (HCC)   6. Carotid artery disease, unspecified laterality ([place])      PLAN:  In order of problems listed above:  1. Chest pain: Some the characteristic of the chest pain is certainly atypical. However patient says this is very similar to his previous angina when he got 2 stents at [facility]. Also he has had multiple office visit and [facility] visits in the last 2 months. We will proceed with treadmill nuclear stress test. If negative, no further workup is needed and we will continue to observe the symptom for now. Patient was agreeable to this plan. 2. CAD  (DES x 2 to RCA in 2013, DES of OM and PLA branches 2015, LHC in 2016 w/ patent stents and nonobs CAD)  - 2.25 x 12 mm Promus DES placed in PLA 1 and 2.5 x 12 mm Promus DES placed in OM 2 by Dr. Burt Knack on 06/08/2014. He will return in April 2016 for another cardiac catheterization which did not show significant culprit lesion to explain his symptoms  3. HTN: Blood pressure well  controlled on current  regimen. On 60 mg Imdur for antianginal purposes.  4. HLD: On Crestor 20 mg daily  5. DM II: On Januvia    Medication Adjustments/Labs and Tests Ordered: Current medicines are reviewed at length with the patient today.  Concerns regarding medicines are outlined above.  Medication changes, Labs and Tests ordered today are listed in the Patient Instructions below. Patient Instructions  Medication Instructions:  Your physician recommends that you continue on your current medications as directed. Please refer to the Current Medication list given to you today.   Labwork: NONE  Testing/Procedures: Your physician has requested that you have en exercise stress myoview. For further information please visit [email or web address]. Please follow instruction sheet, as given.  YOU WILL NEED TO HOLD ATENOLOL THE NIGHT BEFORE TEST AS WELL AS THE MORNING OF STRESS TEST  Follow-Up: FOLLOW UP WITH DR. Gwenlyn Found 03/2017  Any Other Special Instructions Will Be Listed Below (If Applicable).     If you need a refill on your cardiac medications before your next appointment, please call your pharmacy.      Hilbert Corrigan, [place]  06/07/2016 1:34 PM    [facility] [place], [place], [place]  [address] Phone: [phone]; Fax: [phone]

## 2016-06-06 NOTE — ED Triage Notes (Addendum)
Pt reports chest pain since Friday to left chest radiating to wrists with occasional shortness of breath. Pt also reports nausea and near syncope. Pt has apt with cardiologist tomorrow. Pt reports tightness to chest is intermittent. PT has lost weight recently from giving up sugar.

## 2016-06-06 NOTE — Telephone Encounter (Signed)
New message       Pt c/o of Chest Pain: STAT if CP now or developed within 24 hours  1. Are you having CP right now? no 2. Are you experiencing any other symptoms (ex. SOB, nausea, vomiting, sweating)? Sob, lightheaded and feels hot but no temp 3. How long have you been experiencing CP?  Started last Friday-----pt took it easy over the weekend  4. Is your CP continuous or coming and going? Comes and goes 5. Have you taken Nitroglycerin?  Took a nitro on sat ?

## 2016-06-07 ENCOUNTER — Encounter: Payer: Self-pay | Admitting: Physician Assistant

## 2016-06-07 ENCOUNTER — Ambulatory Visit (INDEPENDENT_AMBULATORY_CARE_PROVIDER_SITE_OTHER): Payer: Commercial Managed Care - HMO | Admitting: Physician Assistant

## 2016-06-07 VITALS — BP 120/68 | HR 88 | Ht 68.5 in | Wt 195.0 lb

## 2016-06-07 DIAGNOSIS — E118 Type 2 diabetes mellitus with unspecified complications: Secondary | ICD-10-CM

## 2016-06-07 DIAGNOSIS — I739 Peripheral vascular disease, unspecified: Secondary | ICD-10-CM

## 2016-06-07 DIAGNOSIS — E785 Hyperlipidemia, unspecified: Secondary | ICD-10-CM | POA: Diagnosis not present

## 2016-06-07 DIAGNOSIS — R079 Chest pain, unspecified: Secondary | ICD-10-CM | POA: Diagnosis not present

## 2016-06-07 DIAGNOSIS — I10 Essential (primary) hypertension: Secondary | ICD-10-CM | POA: Diagnosis not present

## 2016-06-07 DIAGNOSIS — I779 Disorder of arteries and arterioles, unspecified: Secondary | ICD-10-CM

## 2016-06-07 DIAGNOSIS — I2510 Atherosclerotic heart disease of native coronary artery without angina pectoris: Secondary | ICD-10-CM

## 2016-06-07 LAB — I-STAT TROPONIN, ED: Troponin i, poc: 0 ng/mL (ref 0.00–0.08)

## 2016-06-07 NOTE — Discharge Instructions (Signed)
Your chest pain work-up was reassuring today.  Please see your cardiologist at 10:30 AM today as scheduled.  Return without fail for worsening symptoms, including worsening pain, difficulty breathing, passing out, or any other symptoms concerning to you.

## 2016-06-07 NOTE — Patient Instructions (Signed)
Medication Instructions:  Your physician recommends that you continue on your current medications as directed. Please refer to the Current Medication list given to you today.   Labwork: NONE  Testing/Procedures: Your physician has requested that you have en exercise stress myoview. For further information please visit [email or web address]. Please follow instruction sheet, as given.  YOU WILL NEED TO HOLD ATENOLOL THE NIGHT BEFORE TEST AS WELL AS THE MORNING OF STRESS TEST  Follow-Up: FOLLOW UP WITH DR. Gwenlyn Found 03/2017  Any Other Special Instructions Will Be Listed Below (If Applicable).     If you need a refill on your cardiac medications before your next appointment, please call your pharmacy.

## 2016-06-07 NOTE — ED Provider Notes (Signed)
[facility] DEPT Provider Note   CSN: [id number] Arrival date & time: 06/06/16  1826   By signing my name below, I, Neta Mends, attest that this documentation has been prepared under the direction and in the presence of Forde Dandy, MD . Electronically Signed: Neta Mends, ED Scribe. 06/07/2016. 1:09 AM.   History   Chief Complaint Chief Complaint  Patient presents with  . Chest Pain   The history is provided by the patient. No language interpreter was used.   HPI Comments:  Reginald Little is a 65 y.o. male with PMHx of DM and HTN who presents to the Emergency Department complaining of intermittent, left sided chest pain for 5 days. Pt states that the pain radiates to his wrists. Pt reports associated SOB, nausea and lightheadedness. Pt states that the chest pain can be brought on by stress, and notes that he has had similar pain previously which he has seen his cardiologist for. States pain can sometimes also be produced by certain movements. He states that his cardiologist does not think that this is cardiac pain. Pt has an appointment with his cardiologist on 06/07/16 at 10:30AM. Pt reports recent loss of 14lbs from reducing his sugar intake. Pt also states that recently he moved sharply and heard a popping noise from his left side. Pt took at NTG pill 3 days ago with mild, temporary relief. Pt denies leg swelling.   Past Medical History:  Diagnosis Date  . Carotid artery disease ([place])    a. s/p L CEA  . Coronary artery disease    a. s/p DES x 2 to the RCA (05/2012 at [facility]) b. s/p DES of OM and PLA branches (05/2014)  c. s/p LHC on 10/06/14 with patent stents and non-obs CAD   . Diabetes mellitus without complication ([place])    a. diet controlled   . GERD (gastroesophageal reflux disease)   . HLD (hyperlipidemia)   . Hypertension   . Obesity   . Syncope    a. 10/2014 - felt to be 2/2 hypotension after recent imdur titration.    Patient Active Problem List   Diagnosis Date Noted  . GERD (gastroesophageal reflux disease)   . Coronary artery disease   . Hypertension   . Syncope 10/21/2014  . Dyspnea on exertion   . CAD S/P percutaneous coronary angioplasty   . Exertional angina ([facility])   . Hyperlipidemia 08/18/2014  . Carotid artery disease ([place]) 08/18/2014  . Claudication ([place]) 08/18/2014  . Left hip pain 07/07/2014  . Essential hypertension 07/07/2014  . Obesity (BMI 30.0-34.9) 07/07/2014  . Chest pain 06/06/2014    Past Surgical History:  Procedure Laterality Date  . CAROTID ENDARTERECTOMY    . CORONARY ANGIOPLASTY WITH STENT PLACEMENT    . LEFT HEART CATHETERIZATION WITH CORONARY ANGIOGRAM N/A 06/08/2014   Procedure: LEFT HEART CATHETERIZATION WITH CORONARY ANGIOGRAM;  Surgeon: Blane Ohara, MD;  Location: [facility] CATH LAB;  Service: Cardiovascular;  Laterality: N/A;  . LEFT HEART CATHETERIZATION WITH CORONARY ANGIOGRAM N/A 10/06/2014   Procedure: LEFT HEART CATHETERIZATION WITH CORONARY ANGIOGRAM;  Surgeon: Leonie Man, MD;  Location: [facility] CATH LAB;  Service: Cardiovascular;  Laterality: N/A;       Home Medications    Prior to Admission medications   Medication Sig Start Date End Date Taking? Authorizing Provider  aspirin EC 81 MG tablet Take 81 mg by mouth daily.   Yes Historical Provider, MD  atenolol (TENORMIN) 50 MG tablet Take 50 mg by mouth  2 (two) times daily.  04/04/16  Yes Historical Provider, MD  Calcium Carb-Cholecalciferol (CALCIUM 600 + D PO) Take 1 tablet by mouth daily.   Yes Historical Provider, MD  Cholecalciferol (VITAMIN D-3) 1000 units CAPS Take 2,000 Units by mouth daily.   Yes Historical Provider, MD  clopidogrel (PLAVIX) 75 MG tablet Take 75 mg by mouth daily.   Yes Historical Provider, MD  Coenzyme Q10 (CO Q 10 PO) Take 1 capsule by mouth daily.   Yes Historical Provider, MD  desonide (DESONATE) 0.05 % gel Apply 1 application topically daily as needed (irritation).   Yes Historical Provider, MD    furosemide (LASIX) 20 MG tablet Take 20 mg by mouth daily.   Yes Historical Provider, MD  ipratropium (ATROVENT) 0.06 % nasal spray Place 1-2 sprays into both nostrils daily as needed for rhinitis.  03/25/16  Yes Historical Provider, MD  isosorbide mononitrate (IMDUR) 60 MG 24 hr tablet Take 60 mg by mouth daily.   Yes Historical Provider, MD  ketoconazole (NIZORAL) 2 % cream Apply 1 application topically daily as needed for irritation.  03/29/16  Yes Historical Provider, MD  losartan (COZAAR) 100 MG tablet Take 50 mg by mouth 2 (two) times daily. 50mg  in the AM  and 50mg  in the PM 04/04/16  Yes Historical Provider, MD  Multiple Vitamins-Minerals (ONE-A-DAY MENS 50+ ADVANTAGE) TABS Take 1 tablet by mouth daily.   Yes Historical Provider, MD  nitroGLYCERIN (NITROSTAT) 0.4 MG SL tablet Place 1 tablet (0.4 mg total) under the tongue every 5 (five) minutes x 3 doses as needed for chest pain. 10/06/14  Yes Eileen Stanford, PA-C  Omega-3 300 MG CAPS Take 900 mg by mouth daily.   Yes Historical Provider, MD  pantoprazole (PROTONIX) 40 MG tablet Take 40 mg by mouth every other day.    Yes Historical Provider, MD  rosuvastatin (CRESTOR) 20 MG tablet Take 1 tablet (20 mg total) by mouth daily. 12/27/15  Yes Lorretta Harp, MD  sitaGLIPtin (JANUVIA) 100 MG tablet Take 100 mg by mouth daily.   Yes Historical Provider, MD  vitamin E 1000 UNIT capsule Take 1,000 Units by mouth daily.   Yes Historical Provider, MD    Family History Family History  Problem Relation Age of Onset  . Heart attack Father 18  . COPD Mother 46    Social History Social History  Substance Use Topics  . Smoking status: Former Smoker    Quit date: 06/05/2012  . Smokeless tobacco: Never Used  . Alcohol use No     Allergies   Imdur [isosorbide nitrate] and Penicillins   Review of Systems Review of Systems 10 Systems reviewed and are negative for acute change except as noted in the HPI.   Physical Exam Updated Vital  Signs BP 118/66   Pulse 82   Temp 98.1 F (36.7 C) (Oral)   Resp 26   SpO2 91%   Physical Exam Physical Exam  Nursing note and vitals reviewed. Constitutional: Well developed, well nourished, non-toxic, and in no acute distress Head: Normocephalic and atraumatic.  Mouth/Throat: Oropharynx is clear and moist.  Neck: Normal range of motion. Neck supple.  Cardiovascular: Normal rate and regular rhythm.   Pulmonary/Chest: Effort normal and breath sounds normal.  Abdominal: Soft. There is no tenderness. There is no rebound and no guarding.  Musculoskeletal: Normal range of motion.  Neurological: Alert, no facial droop, fluent speech, moves all extremities symmetrically Skin: Skin is warm and dry.  Psychiatric: Cooperative  ED Treatments / Results  DIAGNOSTIC STUDIES:  Oxygen Saturation is 96% on RA, normal by my interpretation.    COORDINATION OF CARE:  1:09 AM Discussed treatment plan with pt at bedside and pt agreed to plan.   Labs (all labs ordered are listed, but only abnormal results are displayed) Labs Reviewed  BASIC METABOLIC PANEL - Abnormal; Notable for the following:       Result Value   Chloride 98 (*)    Glucose, Bld 157 (*)    All other components within normal limits  CBC WITH DIFFERENTIAL/PLATELET  [name], ED  [name], ED    EKG  EKG Interpretation  Date/Time:  Tuesday June 06 2016 18:28:57 EST Ventricular Rate:  82 PR Interval:  144 QRS Duration: 96 QT Interval:  386 QTC Calculation: 450 R Axis:   76 Text Interpretation:  Normal sinus rhythm Normal ECG No significant change since last tracing Confirmed by Ayo Smoak MD, Han Vejar [phone]) on 06/07/2016 12:32:08 AM       Radiology Dg Chest 2 View  Result Date: 06/06/2016 CLINICAL DATA:  Acute onset of intermittent generalized chest pain. Initial encounter. EXAM: CHEST  2 VIEW COMPARISON:  Chest radiograph performed 04/13/2016 FINDINGS: The lungs are well-aerated and clear. There  is no evidence of focal opacification, pleural effusion or pneumothorax. Bilateral nipple shadows are noted. The heart is normal in size; the mediastinal contour is within normal limits. No acute osseous abnormalities are seen. IMPRESSION: No acute cardiopulmonary process seen. Electronically Signed   By: Garald Balding M.D.   On: 06/06/2016 19:31    Procedures Procedures (including critical care time)  Medications Ordered in ED Medications - No data to display   Initial Impression / Assessment and Plan / ED Course  I have reviewed the triage vital signs and the nursing notes.  Pertinent labs & imaging results that were available during my care of the patient were reviewed by me and considered in my medical decision making (see chart for details).  Clinical Course     65 year old male with cardiac history, hypertension, hyperlipidemia and diabetes who presents with atypical chest pain. On chart review, he has been seen by his cardiologist for this pain and diagnosed with precordial chest pain. He was admitted for observation just prior to that visit November with unremarkable workup. Says that pain is very similar to that. He has a 6 hour serial troponin that is negative. In the ED, an EKG that is nonischemic and without dynamic changes. Chest x-ray visualized and shows no acute cardiopulmonary processes. I'm not suspecting PE or dissection at this time, especially given the longstanding nature of his symptoms. He has follow-up later today (in 6 hours) with his cardiologist about this pain. He is felt to be stable for same day follow-up with his PCP. Strict return and follow-up instructions reviewed. She expressed understanding of all discharge instructions and felt comfortable with the plan of care.   Final Clinical Impressions(s) / ED Diagnoses   Final diagnoses:  Nonspecific chest pain    New Prescriptions New Prescriptions   No medications on file   I personally performed the  services described in this documentation, which was scribed in my presence. The recorded information has been reviewed and is accurate.    Forde Dandy, MD 06/07/16 [phone]

## 2016-06-08 ENCOUNTER — Other Ambulatory Visit: Payer: Self-pay | Admitting: Cardiovascular Disease

## 2016-06-08 ENCOUNTER — Telehealth (HOSPITAL_COMMUNITY): Payer: Self-pay

## 2016-06-08 NOTE — Telephone Encounter (Signed)
Encounter complete. 

## 2016-06-08 NOTE — Telephone Encounter (Signed)
REFILL 

## 2016-06-13 ENCOUNTER — Ambulatory Visit (HOSPITAL_COMMUNITY)
Admission: RE | Admit: 2016-06-13 | Discharge: 2016-06-13 | Disposition: A | Discharge status: Home / Self Care | Payer: Medicare HMO | Source: Ambulatory Visit | Attending: Physician Assistant | Admitting: Physician Assistant

## 2016-06-13 DIAGNOSIS — Z6828 Body mass index (BMI) 28.0-28.9, adult: Secondary | ICD-10-CM | POA: Diagnosis not present

## 2016-06-13 DIAGNOSIS — E663 Overweight: Secondary | ICD-10-CM | POA: Diagnosis not present

## 2016-06-13 DIAGNOSIS — R079 Chest pain, unspecified: Secondary | ICD-10-CM | POA: Diagnosis not present

## 2016-06-13 DIAGNOSIS — I2510 Atherosclerotic heart disease of native coronary artery without angina pectoris: Secondary | ICD-10-CM | POA: Diagnosis not present

## 2016-06-13 DIAGNOSIS — E119 Type 2 diabetes mellitus without complications: Secondary | ICD-10-CM | POA: Diagnosis not present

## 2016-06-13 DIAGNOSIS — E785 Hyperlipidemia, unspecified: Secondary | ICD-10-CM | POA: Insufficient documentation

## 2016-06-13 DIAGNOSIS — Z87891 Personal history of nicotine dependence: Secondary | ICD-10-CM | POA: Diagnosis not present

## 2016-06-13 DIAGNOSIS — Z8249 Family history of ischemic heart disease and other diseases of the circulatory system: Secondary | ICD-10-CM | POA: Diagnosis not present

## 2016-06-13 DIAGNOSIS — I10 Essential (primary) hypertension: Secondary | ICD-10-CM | POA: Insufficient documentation

## 2016-06-13 LAB — MYOCARDIAL PERFUSION IMAGING
CHL CUP NUCLEAR SRS: 8
CHL CUP NUCLEAR SSS: 11
LV dias vol: 15 mL (ref 62–150)
LV sys vol: 38 mL
Peak HR: 103 {beats}/min
Rest HR: 87 {beats}/min
SDS: 3
TID: 1.05

## 2016-06-13 MED ORDER — REGADENOSON 0.4 MG/5ML IV SOLN
0.4000 mg | Freq: Once | INTRAVENOUS | Status: AC
  Administered 2016-06-13: 0.4 mg via INTRAVENOUS

## 2016-06-13 MED ORDER — TECHNETIUM TC 99M TETROFOSMIN IV KIT
10.7000 | PACK | Freq: Once | INTRAVENOUS | Status: AC | PRN
  Administered 2016-06-13: 10.7 via INTRAVENOUS
  Filled 2016-06-13: qty 11

## 2016-06-13 MED ORDER — AMINOPHYLLINE 25 MG/ML IV SOLN
75.0000 mg | Freq: Once | INTRAVENOUS | Status: AC
  Administered 2016-06-13: 75 mg via INTRAVENOUS

## 2016-06-13 MED ORDER — TECHNETIUM TC 99M TETROFOSMIN IV KIT
31.0000 | PACK | Freq: Once | INTRAVENOUS | Status: AC | PRN
  Administered 2016-06-13: 31 via INTRAVENOUS
  Filled 2016-06-13: qty 31

## 2016-06-14 ENCOUNTER — Encounter: Payer: Self-pay | Admitting: Physician Assistant

## 2016-06-19 DIAGNOSIS — E119 Type 2 diabetes mellitus without complications: Secondary | ICD-10-CM | POA: Diagnosis not present

## 2016-06-19 DIAGNOSIS — H2513 Age-related nuclear cataract, bilateral: Secondary | ICD-10-CM | POA: Diagnosis not present

## 2016-06-19 DIAGNOSIS — Z0100 Encounter for examination of eyes and vision without abnormal findings: Secondary | ICD-10-CM | POA: Diagnosis not present

## 2016-08-10 ENCOUNTER — Telehealth: Payer: Self-pay | Admitting: Physician Assistant

## 2016-08-10 NOTE — Telephone Encounter (Signed)
Pt c/o BP issue: STAT if pt c/o blurred vision, one-sided weakness or slurred speech  1. What are your last 5 BP readings? 88/60  76/51  2. Are you having any other symptoms (ex. Dizziness, headache, blurred vision, passed out)? yes  3. What is your BP issue? low

## 2016-08-10 NOTE — Telephone Encounter (Signed)
Spoke with Reginald Little states that he has had chest pain, lightheaded and SOB 2 days ago this has been intermittent for these 2 days but this has been resolved with nitro x1 dose.he states that he did change his dosage for his atenolol and losartan he has been taking 50mg  daily (not BID) but this is not helping his BP and has taken nitro x1 for 2 days now. Reginald Little is asking if there is anything else to do for this and he will take his atenolol and losartan 50mg  in the morning ans see what [name] directs him  to do in the pm for the dose. Please advise

## 2016-08-11 NOTE — Telephone Encounter (Signed)
I personally called Mr. Reginald Little, he is currently on atenolol 50mg  daily, Imdur 60mg  daily, losartan 50mg  daily. He says his SBP was in the 70s this morning, I have instructed him to hold losartan. Take 50mg  daily atenolol in AM and take 60mg  imdur in PM. He still has intermittent chest pain, I will ask our staff to arrange outpatient visit with either Dr. Gwenlyn Found or me on a day Dr. Gwenlyn Found is here (full day) in the next 2 weeks.

## 2016-08-11 NOTE — Telephone Encounter (Signed)
[id number]  PER HAO PLEASE TRY TO SCHEDULE APPT ON 08-18-16

## 2016-08-14 NOTE — Telephone Encounter (Signed)
Appt scheduled  08-18-16@1030am   Pt states that his BP has been doing well since stopping Losartan today 113/66 HR 80 and yesterday at 7pm 119/68

## 2016-08-16 DIAGNOSIS — E7800 Pure hypercholesterolemia, unspecified: Secondary | ICD-10-CM | POA: Diagnosis not present

## 2016-08-16 DIAGNOSIS — I10 Essential (primary) hypertension: Secondary | ICD-10-CM | POA: Diagnosis not present

## 2016-08-16 DIAGNOSIS — Z6829 Body mass index (BMI) 29.0-29.9, adult: Secondary | ICD-10-CM | POA: Diagnosis not present

## 2016-08-16 DIAGNOSIS — Z7984 Long term (current) use of oral hypoglycemic drugs: Secondary | ICD-10-CM | POA: Diagnosis not present

## 2016-08-16 DIAGNOSIS — E669 Obesity, unspecified: Secondary | ICD-10-CM | POA: Diagnosis not present

## 2016-08-16 DIAGNOSIS — Z79899 Other long term (current) drug therapy: Secondary | ICD-10-CM | POA: Diagnosis not present

## 2016-08-16 DIAGNOSIS — E119 Type 2 diabetes mellitus without complications: Secondary | ICD-10-CM | POA: Diagnosis not present

## 2016-08-18 ENCOUNTER — Ambulatory Visit (INDEPENDENT_AMBULATORY_CARE_PROVIDER_SITE_OTHER): Payer: Medicare HMO | Admitting: Physician Assistant

## 2016-08-18 ENCOUNTER — Encounter: Payer: Self-pay | Admitting: Physician Assistant

## 2016-08-18 VITALS — BP 151/84 | HR 89 | Wt 194.4 lb

## 2016-08-18 DIAGNOSIS — R079 Chest pain, unspecified: Secondary | ICD-10-CM

## 2016-08-18 DIAGNOSIS — E785 Hyperlipidemia, unspecified: Secondary | ICD-10-CM | POA: Diagnosis not present

## 2016-08-18 DIAGNOSIS — I10 Essential (primary) hypertension: Secondary | ICD-10-CM | POA: Diagnosis not present

## 2016-08-18 DIAGNOSIS — I2510 Atherosclerotic heart disease of native coronary artery without angina pectoris: Secondary | ICD-10-CM

## 2016-08-18 DIAGNOSIS — I779 Disorder of arteries and arterioles, unspecified: Secondary | ICD-10-CM | POA: Diagnosis not present

## 2016-08-18 DIAGNOSIS — I739 Peripheral vascular disease, unspecified: Secondary | ICD-10-CM

## 2016-08-18 DIAGNOSIS — E118 Type 2 diabetes mellitus with unspecified complications: Secondary | ICD-10-CM

## 2016-08-18 MED ORDER — ISOSORBIDE MONONITRATE ER 60 MG PO TB24: 90.0000 mg | ORAL_TABLET | Freq: Every day | ORAL | 3 refills | Status: DC

## 2016-08-18 NOTE — Patient Instructions (Addendum)
Medication Instructions:   START TAKING ISOSORBIDE 90 MG ONCE A DAY   If you need a refill on your cardiac medications before your next appointment, please call your pharmacy.  Labwork: NONE ORDERED  TODAY    Testing/Procedures: NONE ORDERED  TODAY     Follow-Up:  IN 4 WEEKS WITH HAO MENG   Any Other Special Instructions Will Be Listed Below (If Applicable).

## 2016-08-18 NOTE — Progress Notes (Signed)
Cardiology Office Note    Date:  08/19/2016   ID:  Reginald Little, DOB 11/10/1950, MRN [id number]  PCP:  Mathews Argyle, MD  Cardiologist:  Dr. Gwenlyn Found  Chief Complaint  Patient presents with  . Follow-up    seen for Dr. Gwenlyn Found  . Chest Pain    intermittent, seconds at a time    History of Present Illness:  Reginald Little is a 66 y.o. male  with PMH of CAD (DES x 2 to RCA in 2013, DES of OM and PLA branches 2015,LHC in 2016 w/patent stents and nonobs CAD), carotid artery disease w/ L-CEA in [place] several years ago, diet controlled DM, HTN, HLD and GERD. Last echocardiogram obtained on 06/06/2014 showed EF 65-99%, grade 1 diastolic dysfunction. His last Myoview obtained on 06/07/2014 showed EF 59%, mixed pattern of reversible or nonreversible decreased myocardial perfusion involving the inferior and inferolateral wall, moderate risk stress test finding. He ended up having 2.25 x 12 mm Promus DES placed in PLA 1 and 2.5 x 12 mm Promus DES placed in OM 2 by Dr. Burt Knack on 06/08/2014. He will return in April 2016 for another cardiac catheterization which did not show significant culprit lesion to explain his symptoms. He has had multiple visits recently to the emergency room for chest pain.   He was seen by Dr. Gwenlyn Found on 04/04/2016 for atypical chest pain which was infrequent the time, no further workup was felt to be needed. He was later admitted to the hospital on 04/14/2016 for chest pain again, troponin was normal. EKG was unchanged. He was discharged on medical therapy. He was seen in the clinic on 04/18/2016 this post hospital follow-up, at which time it was reiterated that no further testing is needed at the time. His chest discomfort was felt to be likely musculoskeletal. Since then he has returned to the emergency room twice. The first time he went back to the ED on 05/08/2016 for elevated blood pressure around 357 systolic. He went back to the emergency room last night for recurrent chest  discomfort. Point-of-care troponin was negative 2 and EKG was negative for ischemic changes. He was discharged to follow-up with cardiology service on the same day.  I last saw the patient for post ED visit on 06/07/2016, the EKG here obtained in the ED and her troponin were reassuring. He mentioned the characteristic of the chest discomfort was very similar to his previous angina prior to the first 2 stents at [facility]. We discussed the various options including observation versus stress test, he eventually was agreeable to undergo nuclear stress test on 06/13/2016, this came back low risk, EF 62%, fixed medium-sized, mild basal and mid inferior perfusion defect which was suspected more likely to be attenuation than infarction, overall no ischemia. I called the patient recently on 08/10/2016 to follow-up, apparently his systolic blood pressure has been dipping down to the 70s, I instructed him to hold losartan and his blood pressure improved. He was still having intermittent chest pain, therefore I recommended another cardiology visit.   He presents today for cardiology office visit. He says he was essentially having daily episodes of chest discomfort 3 weeks ago when he called [place], but since I decreased the blood pressure medications, his symptom has drastically improved. His chest discomfort remain very atypical, lasting only seconds at a time. It does not associated with exertion and he did not notice any chest discomfort while on the treadmill nuclear stress test. Surprisingly, he says his chest discomfort has always  been atypical and his recent symptom is similar to his previous angina. For some reason he did have an episode of prolonged chest pain on Monday which eventually lasted 20-25 minutes. He has never experienced this type of chest discomfort before even with previous angina and has not experienced this chest discomfort since. I did discuss with Dr. Gwenlyn Found who recommended up titration of antianginal  medication and bring the patient back in a month, if he continued to have the same symptoms, we may have to consider relook cardiac catheterization.   Past Medical History:  Diagnosis Date  . Carotid artery disease ([place])    a. s/p L CEA  . Coronary artery disease    a. s/p DES x 2 to the RCA (05/2012 at [facility]) b. s/p DES of OM and PLA branches (05/2014)  c. s/p LHC on 10/06/14 with patent stents and non-obs CAD   . Diabetes mellitus without complication ([place])    a. diet controlled   . GERD (gastroesophageal reflux disease)   . HLD (hyperlipidemia)   . Hypertension   . Obesity   . Syncope    a. 10/2014 - felt to be 2/2 hypotension after recent imdur titration.    Past Surgical History:  Procedure Laterality Date  . CAROTID ENDARTERECTOMY    . CORONARY ANGIOPLASTY WITH STENT PLACEMENT    . LEFT HEART CATHETERIZATION WITH CORONARY ANGIOGRAM N/A 06/08/2014   Procedure: LEFT HEART CATHETERIZATION WITH CORONARY ANGIOGRAM;  Surgeon: Blane Ohara, MD;  Location: [facility] CATH LAB;  Service: Cardiovascular;  Laterality: N/A;  . LEFT HEART CATHETERIZATION WITH CORONARY ANGIOGRAM N/A 10/06/2014   Procedure: LEFT HEART CATHETERIZATION WITH CORONARY ANGIOGRAM;  Surgeon: Leonie Man, MD;  Location: [facility] CATH LAB;  Service: Cardiovascular;  Laterality: N/A;    Current Medications: Outpatient Medications Prior to Visit  Medication Sig Dispense Refill  . aspirin EC 81 MG tablet Take 81 mg by mouth daily.    Marland Kitchen atenolol (TENORMIN) 50 MG tablet Take 50 mg by mouth daily.     . Calcium Carb-Cholecalciferol (CALCIUM 600 + D PO) Take 1 tablet by mouth daily.    . Cholecalciferol (VITAMIN D-3) 1000 units CAPS Take 2,000 Units by mouth daily.    . clopidogrel (PLAVIX) 75 MG tablet TAKE 1 TABLET EVERY DAY 90 tablet 3  . Coenzyme Q10 (CO Q 10 PO) Take 1 capsule by mouth daily.    Marland Kitchen desonide (DESONATE) 0.05 % gel Apply 1 application topically daily as needed (irritation).    . furosemide (LASIX) 20 MG tablet  Take 20 mg by mouth daily.    Marland Kitchen ipratropium (ATROVENT) 0.06 % nasal spray Place 1-2 sprays into both nostrils daily as needed for rhinitis.     Marland Kitchen ketoconazole (NIZORAL) 2 % cream Apply 1 application topically daily as needed for irritation.     . Multiple Vitamins-Minerals (ONE-A-DAY MENS 50+ ADVANTAGE) TABS Take 1 tablet by mouth daily.    . nitroGLYCERIN (NITROSTAT) 0.4 MG SL tablet Place 1 tablet (0.4 mg total) under the tongue every 5 (five) minutes x 3 doses as needed for chest pain. 25 tablet 12  . Omega-3 300 MG CAPS Take 900 mg by mouth daily.    . pantoprazole (PROTONIX) 40 MG tablet Take 40 mg by mouth every other day.     . rosuvastatin (CRESTOR) 20 MG tablet TAKE 1 TABLET EVERY DAY 90 tablet 3  . sitaGLIPtin (JANUVIA) 100 MG tablet Take 100 mg by mouth daily.    . vitamin  E 1000 UNIT capsule Take 1,000 Units by mouth daily.    . isosorbide mononitrate (IMDUR) 60 MG 24 hr tablet TAKE 1 TABLET EVERY DAY 90 tablet 3  . losartan (COZAAR) 100 MG tablet TAKE 1 TABLET EVERY DAY 90 tablet 3   No facility-administered medications prior to visit.      Allergies:   Imdur [isosorbide nitrate] and Penicillins   Social History   Social History  . Marital status: Divorced    Spouse name: N/A  . Number of children: N/A  . Years of education: N/A   Occupational History  . Owns transportation [place] History Main Topics  . Smoking status: Former Smoker    Quit date: 06/05/2012  . Smokeless tobacco: Never Used  . Alcohol use No  . Drug use: No  . Sexual activity: Not Asked   Other Topics Concern  . None   Social History Narrative   Lives alone.     Family History:  The patient's family history includes COPD (age of onset: 85) in his mother; Heart attack (age of onset: 70) in his father.   ROS:   Please see the history of present illness.    ROS All other systems reviewed and are negative.   PHYSICAL EXAM:   VS:  BP (!) 151/84   Pulse 89   Wt 194 lb  6.4 oz (88.2 kg)   BMI 28.71 kg/m    GEN: Well nourished, well developed, in no acute distress  HEENT: normal  Neck: no JVD, carotid bruits, or masses Cardiac: RRR; no murmurs, rubs, or gallops,no edema  Respiratory:  clear to auscultation bilaterally, normal work of breathing GI: soft, nontender, nondistended, + BS MS: no deformity or atrophy  Skin: warm and dry, no rash Neuro:  Alert and Oriented x 3, Strength and sensation are intact Psych: euthymic mood, full affect  Wt Readings from Last 3 Encounters:  08/18/16 194 lb 6.4 oz (88.2 kg)  06/13/16 195 lb (88.5 kg)  06/07/16 195 lb (88.5 kg)      Studies/Labs Reviewed:   EKG:  EKG is not ordered today.    Recent Labs: 06/06/2016: BUN 18; Creatinine, Ser 1.19; Hemoglobin 15.1; Platelets 269; Potassium 4.0; Sodium 139   Lipid Panel    Component Value Date/Time   CHOL 128 10/06/2014 0505   TRIG 96 10/06/2014 0505   HDL 29 (L) 10/06/2014 0505   CHOLHDL 4.4 10/06/2014 0505   VLDL 19 10/06/2014 0505   LDLCALC 80 10/06/2014 0505    Additional studies/ records that were reviewed today include:   Echo 05/27/2014 LV EF: 55% -  60%  ------------------------------------------------------------------- Indications:   Chest pain 786.51.  ------------------------------------------------------------------- History:  PMH: Former Smoker, GERD, 2 stents placed in 2013. Coronary artery disease. Angina pectoris.  ------------------------------------------------------------------- Study Conclusions  - Left ventricle: The cavity size was normal. Wall thickness was increased in a pattern of mild LVH. Systolic function was normal. The estimated ejection fraction was in the range of 55% to 60%. Wall motion was normal; there were no regional wall motion abnormalities. Doppler parameters are consistent with abnormal left ventricular relaxation (grade 1 diastolic dysfunction). Doppler parameters are consistent with  high ventricular filling pressure. - Aortic root: The aortic root was mildly dilated. - Mitral valve: Calcified annulus.  Impressions:  - Normal LV function; grade 1 diastolic dysfuntion; mild LVH; mildly dilated aortic root.    Myoview 05/28/2014 IMPRESSION: 1. Mixed pattern of reversible and non reversible decreased myocardial  perfusion involving the inferior and inferolateral walls of the LEFT ventricle as above.  2. Dyskinetic appearing inferior wall LEFT ventricle.  3. Left ventricular ejection fraction 59%%  4. Moderate-risk stress test findings*.    Cath 06/08/2014 PCI Data: Lesion 1: Vessel - PLA 1 Percent Stenosis (pre) 90 TIMI-flow 3 Stent 2.25x12 mm Promus DES Percent Stenosis (post) 0 TIMI-flow (post) 3  Lesion 2: Vessel - OM2 Percent Stenosis (pre) 90 TIMI-flow 3 Stent 2.25x12 mm Promus DES Percent Stenosis (post) 0 TIMI-flow (post) 3  Radiation dose/Fluoro time: 31.4 minutes  Estimated Blood Loss: minimal  Final Conclusions:  1. Two-vessel coronary artery disease with successful PCI of the obtuse marginal and PLA branches. 2. Continued patency of the RCA stents with mild in-stent restenosis 3. Nonobstructive LAD stenosis 4. Normal LV function by echo assessment  Recommendations:  Continue dual antiplatelet therapy with aspirin and Plavix for another 12 months.    Cath 10/06/2014 Coronary Anatomy:  Dominance: Right   Left Main: Large caliber, long mainstem with a actual septal perforator before trifurcating into the LAD, Ramus Intermedius and Circumflex. Angiographically normal.  TMH:DQQIWL caliber vessel that is patent to the apex. There are mild diffuse irregularities with a roughly 40% focal lesion in the mid vessel. No high-grade lesions noted. There are 3 diagonal branches that are at least moderate caliber with minimal luminal irregularities.  Left Circumflex:Normal caliber, nondominant vessel with 2  obtuse marginal branches. First OM is patent and a second OM are widely patent proximal stent. The remainder of both OM vessels are free of disease. The small AV groove branch and terminates as a small posterolateral artery with no significant disease.   OM1:small-moderate caliber vessel; angiographic normal.   OM 2:small-moderate-caliber vessel with widely patent proximal stent.  . Ramus intermedius:moderate large-caliber vessel that bifurcates in the mid vessel into 2 small moderate caliber branches. Mild luminal irregularities    RCA: Large-caliber, dominant vessel with a high, anterior origin. The vessel is extremely tortuous with at least 3 major bends. It is heavily stented throughout the entire mid segment with mild in-stent stenosis of roughly 30-40%. The vessel bifurcates distally into the RPDA and the Right Posterior AV Groove Branch (RPAV)  RPDA: Moderate caliber vessel. Angiographically normal.  RPL Sysytem:The RPAV begins as a small moderate caliber vessel it bifurcates into 2 posterolateral branches. There is a widely patent stent from the RPAV into RPL 1. No problems with flow in the jailed RPL 2. POST-OPERATIVE DIAGNOSIS:   2+ vessel disease with patent stents throughout the entire mid RCA and posterolateral branch of the RCA as well as OM 2.  Preserved LVEF with moderately elevated LVEDP despite having normal systolic pressures.  Mild (5-10 mm) gradient across aortic valve  No angiographically significant lesion to explain the patient's presentation. Consider possible microvascular ischemia with diastolic dysfunction.   Myoview 06/13/2016 Study Highlights     The left ventricular ejection fraction is normal (55-65%).  Nuclear stress EF: 62%.  There was no ST segment deviation noted during stress.  This is a low risk study.   1. EF 62%, normal wall motion.  2. Fixed medium-sized, mild basal to mid inferior perfusion defect.  Given normal inferior wall  motion, I suspect this is more likely attenuation than infarction.  No ischemia.  3. Low risk study.       ASSESSMENT:    1. Chest pain, unspecified type   2. Coronary artery disease involving native coronary artery of native heart without angina pectoris  3. Left-sided carotid artery disease ([place])   4. Controlled type 2 diabetes mellitus with complication, without long-term current use of insulin ([place])   5. Essential hypertension   6. Hyperlipidemia, unspecified hyperlipidemia type      PLAN:  In order of problems listed above:  1. Atypical chest pain: Recent Myoview was negative, however patient says this is very similar to his previous angina when he got the 2 stents at [facility]. The chest pain he described last only seconds at a time however he does not notice it with exertion. In fact he was able to exercise on the treadmill during the treadmill nuclear stress test without any chest discomfort. He has not including going back to [facility] recently due to the discomfort, I encouraged him to go back to see if the chest pain is more reproducible with exercise. I did discuss with Dr. Gwenlyn Found, patient's primary cardiologist, who recommended up titration of antianginal medication. I increased his Imdur to 90 mg every night. We will see the patient back in one month, if he continued to have chest discomfort, we may have to consider relook cardiac catheterization.  2. CAD (DES x 2 to RCA in 2013, DES of OM and PLA branches 2015,LHC in 2016 w/patent stents and nonobs CAD)   - 2.25 x 12 mm Promus DES placed in PLA 1 and 2.5 x 12 mm Promus DES placed in OM 2 by Dr. Burt Knack on 06/08/2014.   3. Hypertension: We'll increase Imdur to 90 mg daily.  4. Hyperlipidemia: On Crestor 20 mg daily  5. Diabetes mellitus 2: On Januvia    Medication Adjustments/Labs and Tests Ordered: Current medicines are reviewed at length with the patient today.  Concerns regarding medicines are outlined above.  Medication  changes, Labs and Tests ordered today are listed in the Patient Instructions below. Patient Instructions  Medication Instructions:   START TAKING ISOSORBIDE 90 MG ONCE A DAY   If you need a refill on your cardiac medications before your next appointment, please call your pharmacy.  Labwork: NONE ORDERED  TODAY    Testing/Procedures: NONE ORDERED  TODAY     Follow-Up:  IN 4 WEEKS WITH Husein Guedes   Any Other Special Instructions Will Be Listed Below (If Applicable).                                                                                                                                                      Hilbert Corrigan, [place]  08/19/2016 12:43 AM    [address] [place], [place],   [address] Phone: [phone]; Fax: [phone]

## 2016-08-19 ENCOUNTER — Encounter: Payer: Self-pay | Admitting: Physician Assistant

## 2016-08-25 ENCOUNTER — Ambulatory Visit: Payer: Self-pay | Admitting: Physician Assistant

## 2016-09-18 ENCOUNTER — Ambulatory Visit (INDEPENDENT_AMBULATORY_CARE_PROVIDER_SITE_OTHER): Payer: Medicare HMO | Admitting: Physician Assistant

## 2016-09-18 ENCOUNTER — Encounter: Payer: Self-pay | Admitting: Physician Assistant

## 2016-09-18 VITALS — BP 122/70 | HR 76 | Ht 68.0 in | Wt 199.0 lb

## 2016-09-18 DIAGNOSIS — E119 Type 2 diabetes mellitus without complications: Secondary | ICD-10-CM

## 2016-09-18 DIAGNOSIS — I10 Essential (primary) hypertension: Secondary | ICD-10-CM

## 2016-09-18 DIAGNOSIS — R079 Chest pain, unspecified: Secondary | ICD-10-CM | POA: Diagnosis not present

## 2016-09-18 DIAGNOSIS — I739 Peripheral vascular disease, unspecified: Secondary | ICD-10-CM

## 2016-09-18 DIAGNOSIS — E785 Hyperlipidemia, unspecified: Secondary | ICD-10-CM

## 2016-09-18 DIAGNOSIS — I779 Disorder of arteries and arterioles, unspecified: Secondary | ICD-10-CM | POA: Diagnosis not present

## 2016-09-18 DIAGNOSIS — I25119 Atherosclerotic heart disease of native coronary artery with unspecified angina pectoris: Secondary | ICD-10-CM | POA: Diagnosis not present

## 2016-09-18 NOTE — Progress Notes (Signed)
Cardiology Office Note    Date:  09/18/2016   ID:  Reginald Little, DOB Nov 27, 1950, MRN [id number]  PCP:  Mathews Argyle, MD  Cardiologist:  Dr. Gwenlyn Found    Chief Complaint  Patient presents with  . 1 month f/u visit    pt states he has had some chest tightness from angina--no more pain; c/o minor SOB, occasinal dizziness, no other Sx.    History of Present Illness:  Reginald Little is a 66 y.o. male in 2016 w/patent stents and nonobs CAD, carotid artery disease w/ L-CEA in [place] several years ago, diet controlled DM, HTN, HLD and GERD. Last echocardiogram obtained on 06/06/2014 showed EF 39-03%, grade 1 diastolic dysfunction. His last Myoview obtained on 06/07/2014 showed EF 59%, mixed pattern of reversible or nonreversible decreased myocardial perfusion involving the inferior and inferolateral wall, moderate risk stress test finding. He ended up having 2.25 x 12 mm Promus DES placed in PLA 1and 2.5 x 12 mm Promus DES placed in OM 2 by Dr. Burt Knack on 06/08/2014. He will return in April 2016 for another cardiac catheterization which did not show significant culprit lesion to explain his symptoms. He has had multiple visits recently to the emergency room for chest pain.   He was seen by Dr. Gwenlyn Found on 04/04/2016 for atypical chest pain which was infrequent the time, no further workup was felt to be needed. He was in the emergency room 3 times in December 2017 for recurrent chest pain. I saw the patient in December after the last ED visit. Due to persistent chest discomfort, I did obtain Myoview on 06/13/2016 which came back low risk, EF 62%, fixed medium-sized, mild basal and mild inferior perfusion defect suspected more likely to be attenuation artifact than infarction, overall no ischemia. He was having low blood pressure issue as well, I held his losartan and his blood pressure improved. I last saw the patient on 08/18/2016, his symptom has drastically improved, and the characteristic of the chest  discomfort was also quite atypical, therefore I did not recommend to proceed with cardiac catheterization. I discussed with Dr. Gwenlyn Found who recommended that titration of antianginal medication. I increased isosorbide mononitrate 90 mg daily.    Past Medical History:  Diagnosis Date  . Carotid artery disease ([place])    a. s/p L CEA  . Coronary artery disease    a. s/p DES x 2 to the RCA (05/2012 at [facility]) b. s/p DES of OM and PLA branches (05/2014)  c. s/p LHC on 10/06/14 with patent stents and non-obs CAD   . Diabetes mellitus without complication ([place])    a. diet controlled   . GERD (gastroesophageal reflux disease)   . HLD (hyperlipidemia)   . Hypertension   . Obesity   . Syncope    a. 10/2014 - felt to be 2/2 hypotension after recent imdur titration.    Past Surgical History:  Procedure Laterality Date  . CAROTID ENDARTERECTOMY    . CORONARY ANGIOPLASTY WITH STENT PLACEMENT    . LEFT HEART CATHETERIZATION WITH CORONARY ANGIOGRAM N/A 06/08/2014   Procedure: LEFT HEART CATHETERIZATION WITH CORONARY ANGIOGRAM;  Surgeon: Blane Ohara, MD;  Location: [facility] C[facility] LAB;  Service: Cardiovascular;  Laterality: N/A;  . LEFT HEART CATHETERIZATION WITH CORONARY ANGIOGRAM N/A 10/06/2014   Procedure: LEFT HEART CATHETERIZATION WITH CORONARY ANGIOGRAM;  Surgeon: Leonie Man, MD;  Location: [facility] CATH LAB;  Service: Cardiovascular;  Laterality: N/A;    Current Medications: Outpatient Medications Prior to Visit  Medication Sig  Dispense Refill  . aspirin EC 81 MG tablet Take 81 mg by mouth daily.    Marland Kitchen atenolol (TENORMIN) 50 MG tablet Take 50 mg by mouth daily.     . Calcium Carb-Cholecalciferol (CALCIUM 600 + D PO) Take 1 tablet by mouth daily.    . Cholecalciferol (VITAMIN D-3) 1000 units CAPS Take 2,000 Units by mouth daily.    . clopidogrel (PLAVIX) 75 MG tablet TAKE 1 TABLET EVERY DAY 90 tablet 3  . Coenzyme Q10 (CO Q 10 PO) Take 1 capsule by mouth daily.    Marland Kitchen desonide (DESONATE) 0.05 % gel Apply  1 application topically daily as needed (irritation).    . furosemide (LASIX) 20 MG tablet Take 20 mg by mouth daily.    Marland Kitchen ipratropium (ATROVENT) 0.06 % nasal spray Place 1-2 sprays into both nostrils daily as needed for rhinitis.     Marland Kitchen isosorbide mononitrate (IMDUR) 60 MG 24 hr tablet Take 1.5 tablets (90 mg total) by mouth daily. 45 tablet 3  . ketoconazole (NIZORAL) 2 % cream Apply 1 application topically daily as needed for irritation.     . Multiple Vitamins-Minerals (ONE-A-DAY MENS 50+ ADVANTAGE) TABS Take 1 tablet by mouth daily.    . nitroGLYCERIN (NITROSTAT) 0.4 MG SL tablet Place 1 tablet (0.4 mg total) under the tongue every 5 (five) minutes x 3 doses as needed for chest pain. 25 tablet 12  . Omega-3 300 MG CAPS Take 900 mg by mouth daily.    . pantoprazole (PROTONIX) 40 MG tablet Take 40 mg by mouth every other day.     . rosuvastatin (CRESTOR) 20 MG tablet TAKE 1 TABLET EVERY DAY 90 tablet 3  . sitaGLIPtin (JANUVIA) 100 MG tablet Take 100 mg by mouth daily.    . vitamin E 1000 UNIT capsule Take 1,000 Units by mouth daily.     No facility-administered medications prior to visit.      Allergies:   Imdur [isosorbide nitrate] and Penicillins   Social History   Social History  . Marital status: Divorced    Spouse name: N/A  . Number of children: N/A  . Years of education: N/A   Occupational History  . Owns transportation [place] History Main Topics  . Smoking status: Former Smoker    Quit date: 06/05/2012  . Smokeless tobacco: Never Used  . Alcohol use No  . Drug use: No  . Sexual activity: Not Asked   Other Topics Concern  . None   Social History Narrative   Lives alone.     Family History:  The patient's family history includes COPD (age of onset: 22) in his mother; Heart attack (age of onset: 11) in his father.   ROS:   Please see the history of present illness.    ROS All other systems reviewed and are negative.   PHYSICAL EXAM:     VS:  BP 122/70 (BP Location: Left Arm, Patient Position: Sitting, Cuff Size: Normal)   Pulse 76   Ht 5\' 8"  (1.727 m)   Wt 199 lb (90.3 kg)   BMI 30.26 kg/m    GEN: Well nourished, well developed, in no acute distress  HEENT: normal  Neck: no JVD, carotid bruits, or masses Cardiac: RRR; no murmurs, rubs, or gallops,no edema  Respiratory:  clear to auscultation bilaterally, normal work of breathing GI: soft, nontender, nondistended, + BS MS: no deformity or atrophy  Skin: warm and dry, no rash Neuro:  Alert and  Oriented x 3, Strength and sensation are intact Psych: euthymic mood, full affect  Wt Readings from Last 3 Encounters:  09/18/16 199 lb (90.3 kg)  08/18/16 194 lb 6.4 oz (88.2 kg)  06/13/16 195 lb (88.5 kg)      Studies/Labs Reviewed:   EKG:  EKG is not ordered today.    Recent Labs: 06/06/2016: BUN 18; Creatinine, Ser 1.19; Hemoglobin 15.1; Platelets 269; Potassium 4.0; Sodium 139   Lipid Panel    Component Value Date/Time   CHOL 128 10/06/2014 0505   TRIG 96 10/06/2014 0505   HDL 29 (L) 10/06/2014 0505   CHOLHDL 4.4 10/06/2014 0505   VLDL 19 10/06/2014 0505   LDLCALC 80 10/06/2014 0505    Additional studies/ records that were reviewed today include:   Echo 05/27/2014 LV EF: 55% -  60%  - Left ventricle: The cavity size was normal. Wall thickness was increased in a pattern of mild LVH. Systolic function was normal. The estimated ejection fraction was in the range of 55% to 60%. Wall motion was normal; there were no regional wall motion abnormalities. Doppler parameters are consistent with abnormal left ventricular relaxation (grade 1 diastolic dysfunction). Doppler parameters are consistent with high ventricular filling pressure. - Aortic root: The aortic root was mildly dilated. - Mitral valve: Calcified annulus.  Impressions:  - Normal LV function; grade 1 diastolic dysfuntion; mild LVH; mildly dilated aortic  root.    Myoview 05/28/2014 IMPRESSION: 1. Mixed pattern of reversible and non reversible decreased myocardial perfusion involving the inferior and inferolateral walls of the LEFT ventricle as above.  2. Dyskinetic appearing inferior wall LEFT ventricle.  3. Left ventricular ejection fraction 59%%  4. Moderate-risk stress test findings*.    Cath 06/08/2014 PCI Data: Lesion 1: Vessel - PLA 1 Percent Stenosis (pre) 90 TIMI-flow 3 Stent 2.25x12 mm Promus DES Percent Stenosis (post) 0 TIMI-flow (post) 3  Lesion 2: Vessel - OM2 Percent Stenosis (pre) 90 TIMI-flow 3 Stent 2.25x12 mm Promus DES Percent Stenosis (post) 0 TIMI-flow (post) 3  Radiation dose/Fluoro time: 31.4 minutes  Estimated Blood Loss: minimal  Final Conclusions:  1. Two-vessel coronary artery disease with successful PCI of the obtuse marginal and PLA branches. 2. Continued patency of the RCA stents with mild in-stent restenosis 3. Nonobstructive LAD stenosis 4. Normal LV function by echo assessment  Recommendations:  Continue dual antiplatelet therapy with aspirin and Plavix for another 12 months.    Cath 10/06/2014 Coronary Anatomy:  Dominance: Right   Left Main: Large caliber, long mainstem with a actual septal perforator before trifurcating into the LAD, Ramus Intermedius and Circumflex. Angiographically normal.  YHC:WCBJSE caliber vessel that is patent to the apex. There are mild diffuse irregularities with a roughly 40% focal lesion in the mid vessel. No high-grade lesions noted. There are 3 diagonal branches that are at least moderate caliber with minimal luminal irregularities.  Left Circumflex:Normal caliber, nondominant vessel with 2 obtuse marginal branches. First OM is patent and a second OM are widely patent proximal stent. The remainder of both OM vessels are free of disease. The small AV groove branch and terminates as a small posterolateral artery with no  significant disease.   OM1:small-moderate caliber vessel; angiographic normal.   OM 2:small-moderate-caliber vessel with widely patent proximal stent.  . Ramus intermedius:moderate large-caliber vessel that bifurcates in the mid vessel into 2 small moderate caliber branches. Mild luminal irregularities    RCA: Large-caliber, dominant vessel with a high, anterior origin. The vessel is extremely tortuous with  at least 3 major bends. It is heavily stented throughout the entire mid segment with mild in-stent stenosis of roughly 30-40%. The vessel bifurcates distally into the RPDA and the Right Posterior AV Groove Branch (RPAV)  RPDA: Moderate caliber vessel. Angiographically normal.  RPL Sysytem:The RPAV begins as a small moderate caliber vessel it bifurcates into 2 posterolateral branches. There is a widely patent stent from the RPAV into RPL 1. No problems with flow in the jailed RPL 2. POST-OPERATIVE DIAGNOSIS:   2+ vessel disease with patent stents throughout the entire mid RCA and posterolateral branch of the RCA as well as OM 2.  Preserved LVEF with moderately elevated LVEDP despite having normal systolic pressures.  Mild (5-10 mm) gradient across aortic valve  No angiographically significant lesion to explain the patient's presentation. Consider possible microvascular ischemia with diastolic dysfunction.   Myoview 06/13/2016 Study Highlights     The left ventricular ejection fraction is normal (55-65%).  Nuclear stress EF: 62%.  There was no ST segment deviation noted during stress.  This is a low risk study.  1. EF 62%, normal wall motion.  2. Fixed medium-sized, mild basal to mid inferior perfusion defect. Given normal inferior wall motion, I suspect this is more likely attenuation than infarction. No ischemia.  3. Low risk study.       ASSESSMENT:    1. Chest pain, unspecified type   2. Coronary artery disease involving native coronary artery of  native heart with angina pectoris ([place])   3. Left-sided carotid artery disease ([place])   4. Controlled type 2 diabetes mellitus without complication, without long-term current use of insulin ([place])   5. Hyperlipidemia, unspecified hyperlipidemia type   6. Essential hypertension      PLAN:  In order of problems listed above:  1. Atypical chest pain: Negative recent Myoview. However he continued to have chest discomfort, I opted for observation after increasing his Imdur. He came back today for visit, he has noticed significant decrease in episodes of chest discomfort. It is only occurring once a week lasting up to 10 minutes each. His symptoms to be quite stable at this time. We will continue medical therapy and observation.  2. CAD (DES x 2 to RCA in 2013, DES of OM and PLA branches 2015,LHC in 2016 w/patent stents and nonobs CAD)   - 2.25 x 12 mm Promus DES placed in PLA 1and 2.5 x 12 mm Promus DES placed in OM 2 by Dr. Burt Knack on 06/08/2014.  3. Hypertension: Blood pressure is normal today on current medication  4. Hyperlipidemia: On Crestor 20 mg daily  5. DM II: on Januvia    Medication Adjustments/Labs and Tests Ordered: Current medicines are reviewed at length with the patient today.  Concerns regarding medicines are outlined above.  Medication changes, Labs and Tests ordered today are listed in the Patient Instructions below. Patient Instructions  Medication Instructions:  Your physician recommends that you continue on your current medications as directed. Please refer to the Current Medication list given to you today.  Follow-Up: Your physician wants you to follow-up in: 5-6 Months with Dr. Gwenlyn Found. You will receive a reminder letter in the mail two months in advance. If you don't receive a letter, please call our office to schedule the follow-up appointment.   Any Other Special Instructions Will Be Listed Below (If Applicable).     If you need a refill on your cardiac  medications before your next appointment, please call your pharmacy.  Hilbert Corrigan, [place]  09/18/2016 5:36 PM    [facility] [place] [address], [place], [place]  [address] Phone: [phone]; Fax: [phone]

## 2016-09-18 NOTE — Patient Instructions (Signed)
Medication Instructions:  Your physician recommends that you continue on your current medications as directed. Please refer to the Current Medication list given to you today.  Follow-Up: Your physician wants you to follow-up in: 5-6 Months with Dr. Gwenlyn Found. You will receive a reminder letter in the mail two months in advance. If you don't receive a letter, please call our office to schedule the follow-up appointment.   Any Other Special Instructions Will Be Listed Below (If Applicable).     If you need a refill on your cardiac medications before your next appointment, please call your pharmacy.

## 2016-09-29 ENCOUNTER — Other Ambulatory Visit: Payer: Self-pay | Admitting: Cardiovascular Disease

## 2016-12-04 ENCOUNTER — Other Ambulatory Visit: Payer: Self-pay | Admitting: Physician Assistant

## 2016-12-05 NOTE — Telephone Encounter (Signed)
Please review for refill, Thanks !  

## 2016-12-08 ENCOUNTER — Encounter (HOSPITAL_COMMUNITY): Payer: Self-pay | Admitting: Emergency Medicine

## 2016-12-08 DIAGNOSIS — E119 Type 2 diabetes mellitus without complications: Secondary | ICD-10-CM | POA: Insufficient documentation

## 2016-12-08 DIAGNOSIS — Z87891 Personal history of nicotine dependence: Secondary | ICD-10-CM | POA: Diagnosis not present

## 2016-12-08 DIAGNOSIS — Z79899 Other long term (current) drug therapy: Secondary | ICD-10-CM | POA: Diagnosis not present

## 2016-12-08 DIAGNOSIS — Z7982 Long term (current) use of aspirin: Secondary | ICD-10-CM | POA: Insufficient documentation

## 2016-12-08 DIAGNOSIS — R0789 Other chest pain: Secondary | ICD-10-CM | POA: Diagnosis not present

## 2016-12-08 DIAGNOSIS — I10 Essential (primary) hypertension: Secondary | ICD-10-CM | POA: Insufficient documentation

## 2016-12-08 DIAGNOSIS — R079 Chest pain, unspecified: Secondary | ICD-10-CM | POA: Insufficient documentation

## 2016-12-08 DIAGNOSIS — I2510 Atherosclerotic heart disease of native coronary artery without angina pectoris: Secondary | ICD-10-CM | POA: Insufficient documentation

## 2016-12-08 DIAGNOSIS — E785 Hyperlipidemia, unspecified: Secondary | ICD-10-CM | POA: Insufficient documentation

## 2016-12-08 DIAGNOSIS — R072 Precordial pain: Secondary | ICD-10-CM | POA: Diagnosis not present

## 2016-12-08 LAB — I-STAT TROPONIN, ED: TROPONIN I, POC: 0 ng/mL (ref 0.00–0.08)

## 2016-12-08 NOTE — ED Triage Notes (Signed)
Pt in reporting L sided chest pressure with radiation to back starting yesterday. Also endorses SOB, N/V, diaphoresis. Pt has significant cardiac hx.

## 2016-12-09 ENCOUNTER — Emergency Department (HOSPITAL_COMMUNITY): Payer: Medicare HMO

## 2016-12-09 ENCOUNTER — Encounter (HOSPITAL_COMMUNITY): Payer: Self-pay | Admitting: Nurse Practitioner

## 2016-12-09 ENCOUNTER — Emergency Department (HOSPITAL_COMMUNITY)
Admission: EM | Admit: 2016-12-09 | Discharge: 2016-12-09 | Disposition: A | Discharge status: Home / Self Care | Payer: Medicare HMO | Attending: Emergency Medicine | Admitting: Emergency Medicine

## 2016-12-09 DIAGNOSIS — R079 Chest pain, unspecified: Secondary | ICD-10-CM

## 2016-12-09 DIAGNOSIS — R072 Precordial pain: Secondary | ICD-10-CM

## 2016-12-09 HISTORY — DX: Other chest pain: R07.89

## 2016-12-09 LAB — CBC
HEMATOCRIT: 43.7 % (ref 39.0–52.0)
HEMOGLOBIN: 14.4 g/dL (ref 13.0–17.0)
MCH: 29.9 pg (ref 26.0–34.0)
MCHC: 33 g/dL (ref 30.0–36.0)
MCV: 90.7 fL (ref 78.0–100.0)
Platelets: 222 10*3/uL (ref 150–400)
RBC: 4.82 MIL/uL (ref 4.22–5.81)
RDW: 13.9 % (ref 11.5–15.5)
WBC: 6.2 10*3/uL (ref 4.0–10.5)

## 2016-12-09 LAB — I-STAT TROPONIN, ED: TROPONIN I, POC: 0.03 ng/mL (ref 0.00–0.08)

## 2016-12-09 LAB — BASIC METABOLIC PANEL
ANION GAP: 8 (ref 5–15)
BUN: 17 mg/dL (ref 6–20)
CALCIUM: 9.1 mg/dL (ref 8.9–10.3)
CHLORIDE: 102 mmol/L (ref 101–111)
CO2: 29 mmol/L (ref 22–32)
Creatinine, Ser: 0.99 mg/dL (ref 0.61–1.24)
GFR calc non Af Amer: >60 mL/min (ref >60)
GLUCOSE: 182 mg/dL — AB (ref 65–99)
POTASSIUM: 3.8 mmol/L (ref 3.5–5.1)
Sodium: 139 mmol/L (ref 135–145)

## 2016-12-09 MED ORDER — ISOSORBIDE MONONITRATE ER 120 MG PO TB24: 120.0000 mg | ORAL_TABLET | Freq: Every day | ORAL | 6 refills | Status: DC

## 2016-12-09 MED ORDER — ASPIRIN 325 MG PO TABS
325.0000 mg | ORAL_TABLET | Freq: Once | ORAL | Status: AC
  Administered 2016-12-09: 325 mg via ORAL
  Filled 2016-12-09: qty 1

## 2016-12-09 NOTE — ED Provider Notes (Signed)
[facility] DEPT Provider Note   CSN: [id number] Arrival date & time: 12/08/16  2334     History   Chief Complaint Chief Complaint  Patient presents with  . Chest Pain    HPI Reginald Little is a 66 y.o. male.  HPI   Pt is a 66 yo male with past medical history of hypertension, hyperlipidemia, diabetes and CAD who presents to the ED with complaint of chest pain. Patient reports over the past 3 days he has been having intermittent tightness to the left side of his chest. He notes last night around 10 PM the tightness worsened and began to radiate around to the left side of his chest. Denies any aggravating or alleviating factors. He notes during that episode he began to feel nauseous with associated lightheadedness and diaphoresis. Patient reports significant cardiac history with multiple stents placed but notes his chest pain tonight felt different and chest pain he has had previously. Denies taking any medications prior to arrival besides his scheduled home medications. Denies fever, headache, cough, shortness of breath, heart palpitations, abdominal pain, vomiting, numbness, weakness, leg swelling, syncope. Denies smoking. Patient reports having multiple episodes of chest pain since arrival to the ED but states he has not had any chest pain over the past 30 minutes.  PCP- Dr. Felipa Eth Cardiologist- Dr. Gwenlyn Found  Past Medical History:  Diagnosis Date  . Atypical chest pain   . Carotid artery disease ([place])    a. s/p L CEA  . Coronary artery disease    a. s/p DES x 2 to the RCA (05/2012 at [facility]) b. 05/2014 Abnl MV w/ inf/inflat partially rev defect;  c. s/p DES of OM2 and RPL1 (05/2014)  d. s/p LHC on 10/06/14 with patent stents and non-obs CAD; e. 06/2016 MV: low risk, EF 62%, fixed medium-sized, mild basal and inf defect - likely attenuation. No ischemia.  . Diabetes mellitus without complication ([place])   . GERD (gastroesophageal reflux disease)   . HLD (hyperlipidemia)   . Hypertension    . Obesity   . Syncope    a. 10/2014 - felt to be 2/2 hypotension after recent imdur titration.    Patient Active Problem List   Diagnosis Date Noted  . GERD (gastroesophageal reflux disease)   . Coronary artery disease   . Hypertension   . Syncope 10/21/2014  . Dyspnea on exertion   . CAD S/P percutaneous coronary angioplasty   . Exertional angina ([facility])   . Hyperlipidemia 08/18/2014  . Carotid artery disease ([place]) 08/18/2014  . Claudication ([place]) 08/18/2014  . Left hip pain 07/07/2014  . Essential hypertension 07/07/2014  . Obesity (BMI 30.0-34.9) 07/07/2014  . Chest pain 06/06/2014    Past Surgical History:  Procedure Laterality Date  . CAROTID ENDARTERECTOMY    . CORONARY ANGIOPLASTY WITH STENT PLACEMENT    . LEFT HEART CATHETERIZATION WITH CORONARY ANGIOGRAM N/A 06/08/2014   Procedure: LEFT HEART CATHETERIZATION WITH CORONARY ANGIOGRAM;  Surgeon: Blane Ohara, MD;  Location: [facility] CATH LAB;  Service: Cardiovascular;  Laterality: N/A;  . LEFT HEART CATHETERIZATION WITH CORONARY ANGIOGRAM N/A 10/06/2014   Procedure: LEFT HEART CATHETERIZATION WITH CORONARY ANGIOGRAM;  Surgeon: Leonie Man, MD;  Location: [facility] CATH LAB;  Service: Cardiovascular;  Laterality: N/A;       Home Medications    Prior to Admission medications   Medication Sig Start Date End Date Taking? Authorizing Provider  aspirin EC 81 MG tablet Take 81 mg by mouth daily.   Yes [provider]  atenolol (TENORMIN) 50 MG tablet Take 50 mg by mouth daily.  04/04/16  Yes [provider]  clopidogrel (PLAVIX) 75 MG tablet TAKE 1 TABLET EVERY DAY 06/08/16  Yes Lorretta Harp, MD  Coenzyme Q10 (CO Q 10 PO) Take 1 capsule by mouth daily.   Yes [provider]  furosemide (LASIX) 20 MG tablet Take 20 mg by mouth daily.   Yes [provider]  Multiple Vitamins-Minerals (ONE-A-DAY MENS 50+ ADVANTAGE) TABS Take 1 tablet by mouth daily.   Yes [provider]    nitroGLYCERIN (NITROSTAT) 0.4 MG SL tablet Place 1 tablet (0.4 mg total) under the tongue every 5 (five) minutes x 3 doses as needed for chest pain. 10/06/14  Yes Eileen Stanford, PA-C  pantoprazole (PROTONIX) 40 MG tablet TAKE 1 TABLET DAILY IF NEEDED Patient taking differently: TAKE 1 TABLET DAILY 12/05/16  Yes Almyra Deforest, PA  rosuvastatin (CRESTOR) 20 MG tablet TAKE 1 TABLET EVERY DAY 06/08/16  Yes Lorretta Harp, MD  sitaGLIPtin (JANUVIA) 100 MG tablet Take 100 mg by mouth daily.   Yes [provider]  isosorbide mononitrate (IMDUR) 120 MG 24 hr tablet Take 1 tablet (120 mg total) by mouth daily. 12/09/16   Rogelia Mire, NP    Family History Family History  Problem Relation Age of Onset  . Heart attack Father 62  . COPD Mother 70    Social History Social History  Substance Use Topics  . Smoking status: Former Smoker    Quit date: 06/05/2012  . Smokeless tobacco: Never Used  . Alcohol use No     Allergies   Penicillins   Review of Systems Review of Systems  Constitutional: Positive for diaphoresis.  Cardiovascular: Positive for chest pain.  Gastrointestinal: Positive for nausea.  Neurological: Positive for light-headedness.  All other systems reviewed and are negative.    Physical Exam Updated Vital Signs BP (!) 148/66   Pulse 75   Temp 97.7 F (36.5 C) (Oral)   Resp 15   Ht 5\' 8"  (1.727 m)   Wt 93 kg (205 lb)   SpO2 99%   BMI 31.17 kg/m   Physical Exam  Constitutional: He is oriented to person, place, and time. He appears well-developed and well-nourished. No distress.  HENT:  Head: Normocephalic and atraumatic.  Mouth/Throat: Oropharynx is clear and moist. No oropharyngeal exudate.  Eyes: Conjunctivae and EOM are normal. Right eye exhibits no discharge. Left eye exhibits no discharge. No scleral icterus.  Neck: Normal range of motion. Neck supple.  Cardiovascular: Normal rate, regular rhythm, normal heart sounds and intact distal  pulses.   Pulmonary/Chest: Effort normal and breath sounds normal. No respiratory distress. He has no wheezes. He has no rales. He exhibits no tenderness.  Abdominal: Soft. He exhibits no distension. There is no tenderness.  Musculoskeletal: Normal range of motion. He exhibits no edema.  Neurological: He is alert and oriented to person, place, and time.  Skin: Skin is warm and dry. He is not diaphoretic.  Nursing note and vitals reviewed.    ED Treatments / Results  Labs (all labs ordered are listed, but only abnormal results are displayed) Labs Reviewed  BASIC METABOLIC PANEL - Abnormal; Notable for the following:       Result Value   Glucose, Bld 182 (*)    All other components within normal limits  CBC  I-STAT TROPOININ, ED  [name], ED    EKG  EKG Interpretation  Date/Time:  Friday December 08 2016 23:38:37 EDT Ventricular Rate:  84 PR Interval:  146 QRS Duration: 90 QT Interval:  388 QTC Calculation: 458 R Axis:   56 Text Interpretation:  Normal sinus rhythm Normal ECG No significant change was found Confirmed by Ezequiel Essex [phone]) on 12/09/2016 5:23:52 AM       Radiology Dg Chest 2 View  Result Date: 12/09/2016 CLINICAL DATA:  Chest pain radiating to the back for 1 day. EXAM: CHEST  2 VIEW COMPARISON:  06/06/2016 FINDINGS: Mild left hemidiaphragm elevation, stable. The lungs are clear. The pulmonary vasculature is normal. There is no pleural effusion. Heart size is normal. Hilar and mediastinal contours are unremarkable and unchanged. IMPRESSION: No active cardiopulmonary disease. Electronically Signed   By: Andreas Newport M.D.   On: 12/09/2016 00:49    Procedures Procedures (including critical care time)  Medications Ordered in ED Medications  aspirin tablet 325 mg (325 mg Oral Given 12/09/16 0714)     Initial Impression / Assessment and Plan / ED Course  I have reviewed the triage vital signs and the nursing notes.  Pertinent labs & imaging  results that were available during my care of the patient were reviewed by me and considered in my medical decision making (see chart for details).     Patient presents with intermittent episodes of chest pain over the past 3 days. Reports having an episode last night around 10 PM with associated diaphoresis, lightheadedness and nausea. PMh of hypertension, hyperlipidemia, diabetes and CAD. Reports chest pain feels different than episodes he has had in the past related to his cardiac disease. CP free since my initial evaluation in the ED. VSS. Exam unremarkable. Patient currently denies any chest pain.  Chart review shows pt with nuclear stress test performed on 06/13/16 showing EF 62%, no ST segment deviation during stress. Hx of PCI to RCA (DES x2) at [facility] and PCI to RPL and OM 2 by Dr. Burt Knack in December 2015. Most recent cath performed on 10/06/14 showed 2+ vessel disease with patent stents.  EKG showed sinus rhythm with no acute ischemic changes. Trop negative. Labs unremarkable. CXR negative. HEART score 5.  Discussed pt with Dr. Wyvonnia Dusky who also evaluated pt in the ED. Plan to consult cardiology. Dr. Debara Pickett reports they will come see the pt in the ED.  Cardiology advised to increased rx of Imdur to 120mg  daily and f/u in clinic next week. Discussed results and plan for d/c with pt. Discussed return precautions.    Final Clinical Impressions(s) / ED Diagnoses   Final diagnoses:  Chest pain, unspecified type    New Prescriptions Current Discharge Medication List          Nona Dell, Hershal Coria 12/09/16 1607    Ezequiel Essex, MD 12/09/16 2251

## 2016-12-09 NOTE — Discharge Instructions (Signed)
Take your new prescription of Imdur as prescribed, 120 mg once daily. Continue taking your other home medications as prescribed. Follow-up with your cardiologist next week at your scheduled appointment for follow-up evaluation. Return to the emergency department if symptoms worsen or new onset of fever, new/worsening chest pain, difficulty breathing, coughing up blood, vomiting, numbness, weakness, syncope, seizure.

## 2016-12-09 NOTE — ED Notes (Signed)
Cardiology at bedside.

## 2016-12-09 NOTE — Consult Note (Signed)
History & Physical    Patient ID: Reginald Little MRN: [id number], DOB/AGE: Oct 19, 1950   Admit date: 12/09/2016   Primary Physician: Lajean Manes, MD Primary Cardiologist: Adora Fridge, MD  Patient Profile    66 y/o ? with a h/o CAD s/p prior RCA, OM, and PLA stenting, atypical c/p, DM, HTN, HL, GERD, carotid dzs s/p L CEA, and obesity, who is being evaluated for chest pain at the request of N. Nadeau, PA-C.  Past Medical History    Past Medical History:  Diagnosis Date  . Atypical chest pain   . Carotid artery disease ([place])    a. s/p L CEA  . Coronary artery disease    a. s/p DES x 2 to the RCA (05/2012 at [facility]) b. 05/2014 Abnl MV w/ inf/inflat partially rev defect;  c. s/p DES of OM2 and RPL1 (05/2014)  d. s/p LHC on 10/06/14 with patent stents and non-obs CAD; e. 06/2016 MV: low risk, EF 62%, fixed medium-sized, mild basal and inf defect - likely attenuation. No ischemia.  . Diabetes mellitus without complication ([place])   . GERD (gastroesophageal reflux disease)   . HLD (hyperlipidemia)   . Hypertension   . Obesity   . Syncope    a. 10/2014 - felt to be 2/2 hypotension after recent imdur titration.    Past Surgical History:  Procedure Laterality Date  . CAROTID ENDARTERECTOMY    . CORONARY ANGIOPLASTY WITH STENT PLACEMENT    . LEFT HEART CATHETERIZATION WITH CORONARY ANGIOGRAM N/A 06/08/2014   Procedure: LEFT HEART CATHETERIZATION WITH CORONARY ANGIOGRAM;  Surgeon: Blane Ohara, MD;  Location: [facility] CATH LAB;  Service: Cardiovascular;  Laterality: N/A;  . LEFT HEART CATHETERIZATION WITH CORONARY ANGIOGRAM N/A 10/06/2014   Procedure: LEFT HEART CATHETERIZATION WITH CORONARY ANGIOGRAM;  Surgeon: Leonie Man, MD;  Location: [facility] CATH LAB;  Service: Cardiovascular;  Laterality: N/A;     Allergies  Allergies  Allergen Reactions  . Penicillins Rash    Has patient had a PCN reaction causing immediate rash, facial/tongue/throat swelling, SOB or lightheadedness with hypotension:  YES Has patient had a PCN reaction causing severe rash involving mucus membranes or skin necrosis: NO Has patient had a PCN reaction that required hospitalization NO Has patient had a PCN reaction occurring within the last 10 years: NO If all of the above answers are "NO", then may proceed with Cephalosporin use.     History of Present Illness    66 y/o ? with the above complex PMH.  CAD hx dates back to 2013, when he underwent stenting of the RCA x 2 @ the [facility].  He subsequently had an abnl MV in 05/2014, showing an inferior/inferolateral partially reversible defect.  Cath revealed new OM2 and RPL1 dzs, and both areas were treated with DES'.  His last cath was performed in 09/2014 in the setting of ongoing chest discomfort.  This revealed patent stents w/ otw nonobs dzs.  He was medically managed.  Other hx includes HTN, HL, DM, GERD, L CEA, and obesity.  He has had multiple office visits over the last year with somewhat atypical chest pain.  A MV was undertaken in 06/2016 and was low risk.  In March, due to ongoing atypical c/p, his imdur was titrated to 90mg  daily.  Following this, he noted significant improvement in symptoms.  He lives locally and is reasonably active, though he doesn't exercise.  He was in his [place] on 6/28, when while watching TV, he noted mild left sided  chest tightness (from the left nipple to the axillary area) w/o assoc Ss, lasting ~ 10-20 seconds, and resolving spontaneously.  He took a ntg a little after lunch, just to see if he could prevent these brief spells from recurring, but he did continue to have intermittent, brief episodes of left sided chest tightness throughout the day on 6/28 and 6/29.  He did not have any symptoms with activity and he ran his usual errands without limitations over those two days.  On the evening of 6/29, he was again watching TV when he noted a slightly more severe left sided pressure/tightness, this time associated with mild diaphoresis and  lightheadedness.  This lasted about 45 seconds and resolved spontaneously.  Due to recurrent Ss, he decided to come to the ED to be evaluated.  Here, ECG and troponins are nl.  CXR is unremarkable.  He says that he did have about 9 more brief episodes of fleeting c/p while in the waiting room last night, but is currently c/p free.  He is eager to go home.  Home Medications    Prior to Admission medications   Medication Sig Start Date End Date Taking? Authorizing Provider  aspirin EC 81 MG tablet Take 81 mg by mouth daily.   Yes [provider]  atenolol (TENORMIN) 50 MG tablet Take 50 mg by mouth daily.  04/04/16  Yes [provider]  clopidogrel (PLAVIX) 75 MG tablet TAKE 1 TABLET EVERY DAY 06/08/16  Yes Lorretta Harp, MD  Coenzyme Q10 (CO Q 10 PO) Take 1 capsule by mouth daily.   Yes [provider]  furosemide (LASIX) 20 MG tablet Take 20 mg by mouth daily.   Yes [provider]  isosorbide mononitrate (IMDUR) 60 MG 24 hr tablet Take 1.5 tablets (90 mg total) by mouth daily. 08/18/16  Yes Almyra Deforest, PA  Multiple Vitamins-Minerals (ONE-A-DAY MENS 50+ ADVANTAGE) TABS Take 1 tablet by mouth daily.   Yes [provider]  nitroGLYCERIN (NITROSTAT) 0.4 MG SL tablet Place 1 tablet (0.4 mg total) under the tongue every 5 (five) minutes x 3 doses as needed for chest pain. 10/06/14  Yes Eileen Stanford, PA-C  pantoprazole (PROTONIX) 40 MG tablet TAKE 1 TABLET DAILY IF NEEDED Patient taking differently: TAKE 1 TABLET DAILY 12/05/16  Yes Almyra Deforest, PA  rosuvastatin (CRESTOR) 20 MG tablet TAKE 1 TABLET EVERY DAY 06/08/16  Yes Lorretta Harp, MD  sitaGLIPtin (JANUVIA) 100 MG tablet Take 100 mg by mouth daily.   Yes [provider]    Family History    Family History  Problem Relation Age of Onset  . Heart attack Father 66  . COPD Mother 59    Social History    Social History   Social History  . Marital status: Divorced    Spouse  name: N/A  . Number of children: N/A  . Years of education: N/A   Occupational History  . 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 Smoking status: Former Smoker    Quit date: 06/05/2012  . Smokeless tobacco: Never Used  . Alcohol use No  . Drug use: No  . Sexual activity: Not on file   Other Topics Concern  . Not on file   Social History Narrative   Lives in [place] by himself.  Family and friends nearby.  He is a Psychologist, clinical.       Review of Systems    General:  No chills,  fever, night sweats or weight changes.  Cardiovascular:  +++ chest pain, no dyspnea on exertion, edema, orthopnea, palpitations, paroxysmal nocturnal dyspnea. He did experience lightheadedness and diaphoresis w/ c/p prior to ER arrival. Dermatological: No rash, lesions/masses Respiratory: No cough, dyspnea Urologic: No hematuria, dysuria Abdominal:   No nausea, vomiting, diarrhea, bright red blood per rectum, melena, or hematemesis Neurologic:  No visual changes, wkns, changes in mental status. All other systems reviewed and are otherwise negative except as noted above.  Physical Exam    Blood pressure 125/75, pulse 72, temperature 97.7 F (36.5 C), temperature source Oral, resp. rate 16, height 5\' 8"  (1.727 m), weight 205 lb (93 kg), SpO2 100 %.  General: Pleasant, NAD Psych: Normal affect. Neuro: Alert and oriented X 3. Moves all extremities spontaneously. HEENT: Normal  Neck: Supple without bruits or JVD. Lungs:  Resp regular and unlabored, CTA. Heart: RRR no s3, s4, or murmurs. Abdomen: Soft, non-tender, non-distended, BS + x 4.  Extremities: No clubbing, cyanosis or edema. DP/PT/Radials 1+ and equal bilaterally.  Labs    Troponin [facility] of Care Test)  Recent Labs  12/09/16 0535  TROPIPOC 0.03    Lab Results  Component Value Date   WBC 6.2 12/08/2016   HGB 14.4 12/08/2016   HCT 43.7 12/08/2016   MCV 90.7 12/08/2016   PLT 222 12/08/2016     Recent Labs Lab  12/08/16 2338  NA 139  K 3.8  CL 102  CO2 29  BUN 17  CREATININE 0.99  CALCIUM 9.1  GLUCOSE 182*   Lab Results  Component Value Date   CHOL 128 10/06/2014   HDL 29 (L) 10/06/2014   LDLCALC 80 10/06/2014   TRIG 96 10/06/2014     Radiology Studies    Dg Chest 2 View  Result Date: 12/09/2016 CLINICAL DATA:  Chest pain radiating to the back for 1 day. EXAM: CHEST  2 VIEW COMPARISON:  06/06/2016 FINDINGS: Mild left hemidiaphragm elevation, stable. The lungs are clear. The pulmonary vasculature is normal. There is no pleural effusion. Heart size is normal. Hilar and mediastinal contours are unremarkable and unchanged. IMPRESSION: No active cardiopulmonary disease. Electronically Signed   By: Andreas Newport M.D.   On: 12/09/2016 00:49    ECG & Cardiac Imaging    RSR, 81, no acute ST/T changes.  Assessment & Plan    1.  Atypical Chest pain/CAD:  Pt with a h/o CAD s/p RCA stenting x 2 in 2013, followed by DES to the OM2 and RPL1 in 05/2014.  He has had intermittent, atypical chest discomfort since then with nonobs CAD on cath in 09/2014, and neg MV in 06/2016.  C/p Ss improved earlier this year with titration of oral long-acting nitrate therapy to 90mg  daily.  He presented to the ED late last night with a two day h/o intermittent left sided, fleeting chest tightness, lasting about 10-20 seconds and resolving spontaneously.  He did have one episode that lasted about 45 seconds and was assoc with LH and diaphoresis, which ultimately prompted presentation.  Here, ECG is nl.  Troponins have been nl.  CXR unremarkable.  He is currently c/p free.  He would like to go home.  With atypical symptoms and in the absence of objective evidence of ischemia, discharge from the ED is reasonable.  I will increase his imdur to 120 mg daily since he has had improvement in Ss with nitrate titration in the past.  Cont asa, plavix, statin,  blocker.  F/u in clinic  in 1-2 wks to re-eval.  2.  Essential HTN:  BP  stable.  Had previously suffered from low bp's, prompting discontinuation of ARB rx.  Cont  blocker, lasix, and nitrate.  3.  HL:  LDL 58 08/2016.  Currently on crestor 20.  4.  DM:  Managed by PCP.  A1c 6.7.  On Januvia.  5.  GERD:  Uses PPI as needed.  Currently Ss are not similar to GERD Ss.  6.  Carotid Arterial Dzs:  S/p L CEA.  Last U/S that I can find was in 08/2014 - 1-49% RICA stenosis, patent L ICA.  Rec @ that time was for f/u in 1 year.  This can be ordered as outpt.  Signed, Murray Hodgkins, NP 12/09/2016, 8:49 AM

## 2016-12-19 ENCOUNTER — Ambulatory Visit (INDEPENDENT_AMBULATORY_CARE_PROVIDER_SITE_OTHER): Payer: Medicare HMO | Admitting: Physician Assistant

## 2016-12-19 ENCOUNTER — Encounter: Payer: Self-pay | Admitting: Physician Assistant

## 2016-12-19 VITALS — BP 132/80 | HR 66 | Ht 68.5 in | Wt 214.0 lb

## 2016-12-19 DIAGNOSIS — I25118 Atherosclerotic heart disease of native coronary artery with other forms of angina pectoris: Secondary | ICD-10-CM | POA: Diagnosis not present

## 2016-12-19 DIAGNOSIS — I779 Disorder of arteries and arterioles, unspecified: Secondary | ICD-10-CM

## 2016-12-19 DIAGNOSIS — E785 Hyperlipidemia, unspecified: Secondary | ICD-10-CM | POA: Diagnosis not present

## 2016-12-19 DIAGNOSIS — I10 Essential (primary) hypertension: Secondary | ICD-10-CM

## 2016-12-19 DIAGNOSIS — E119 Type 2 diabetes mellitus without complications: Secondary | ICD-10-CM

## 2016-12-19 DIAGNOSIS — I739 Peripheral vascular disease, unspecified: Secondary | ICD-10-CM

## 2016-12-19 NOTE — Patient Instructions (Signed)
Medication Instructions:   No changes  Labwork:   none  Testing/Procedures:  none  Follow-Up:  With Dr. Gwenlyn Found on Sept 18th (Tuesday) at 2:45pm Call us beforehand if you're having recurrence of chest symptoms as we may need to bring you in sooner for PA appointment.  If you need a refill on your cardiac medications before your next appointment, please call your pharmacy.

## 2016-12-19 NOTE — Progress Notes (Signed)
Cardiology Office Note    Date:  12/19/2016   ID:  [name], DOB [birth year], MRN [id number]  PCP:  Lajean Manes, MD  Cardiologist:  Dr. Gwenlyn Found   Chief Complaint  Patient presents with  . Follow-up    seen for Dr. Gwenlyn Found    History of Present Illness:  Reginald Little is a 66 y.o. male with PMH of CAD (DES x 2 to RCA in 2013, DES of OM and PLA branches 2015,LHC in 2016 w/patent stents and nonobs CAD), carotid artery disease w/ L-CEA in [place] several years ago, diet controlled DM, HTN, HLD and GERD. Last echocardiogram obtained on 06/06/2014 showed EF 85-63%, grade 1 diastolic dysfunction. His Myoview obtained on 06/07/2014 showed EF 59%, mixed pattern of reversible or nonreversible decreased myocardial perfusion involving the inferior and inferolateral wall, moderate risk stress test finding. He ended up having 2.25 x 12 mm Promus DES placed in PLA 1 and 2.5 x 12 mm Promus DES placed in OM 2 by Dr. Burt Knack on 06/08/2014. He returned in April 2016 for another cardiac catheterization which did not show significant culprit lesion to explain his symptoms. He has had multiple visits recently to the emergency room for chest pain.   I last saw the patient in December for chest discomfort after he presented to the emergency room multiple times. Myoview obtained on 06/13/2016 came back low risk, EF 62%, fixed medium-sized mild basal and mild inferior perfusion defect more likely to be an attenuation artifact than infarction, overall no ischemia. He was having low blood pressure as well, and held his losartan and his blood pressure improved. He was still having chest discomfort the last time I saw him, after discussing with Dr. Gwenlyn Found, we felt his symptom was quite atypical, I increased isosorbide mononitrate 90 mg daily. He recently presented to the ED again on 12/09/2016, chest pain improved and within 10 seconds. He was seen by Dr. Debara Pickett and Ignacia Bayley NP. His Imdur was increased to 120 mg daily.  Point-of-care troponin was negative 2.  He presents today for cardiology visit, since his Imdur was increased to 120 mg daily, he has not had any recurrent chest discomfort. He says he never had this type of chest discomfort before prior to the recent ED visit. He described as a left flank pain and tenderness under his left nipple. Last only a few seconds at a time before going away. He denies any significant shortness of breath, lower extremity, orthopnea or paroxysmal nocturnal dyspnea. Otherwise she has been doing well and has not had any recurrent chest discomfort since she left the emergency room 10 days ago. I will continue on the current medication. However if he has recurrent chest discomfort, I would have very low threshold to recommend cardiac catheterization given his multiple recent ED visit despite negative Myoview.   Past Medical History:  Diagnosis Date  . Atypical chest pain   . Carotid artery disease ([place])    a. s/p L CEA  . Coronary artery disease    a. s/p DES x 2 to the RCA (05/2012 at [facility]) b. 05/2014 Abnl MV w/ inf/inflat partially rev defect;  c. s/p DES of OM2 and RPL1 (05/2014)  d. s/p LHC on 10/06/14 with patent stents and non-obs CAD; e. 06/2016 MV: low risk, EF 62%, fixed medium-sized, mild basal and inf defect - likely attenuation. No ischemia.  . Diabetes mellitus without complication ([place])   . GERD (gastroesophageal reflux disease)   . HLD (hyperlipidemia)   .  Hypertension   . Obesity   . Syncope    a. 10/2014 - felt to be 2/2 hypotension after recent imdur titration.    Past Surgical History:  Procedure Laterality Date  . CAROTID ENDARTERECTOMY    . CORONARY ANGIOPLASTY WITH STENT PLACEMENT    . LEFT HEART CATHETERIZATION WITH CORONARY ANGIOGRAM N/A 06/08/2014   Procedure: LEFT HEART CATHETERIZATION WITH CORONARY ANGIOGRAM;  Surgeon: Blane Ohara, MD;  Location: [facility] CATH LAB;  Service: Cardiovascular;  Laterality: N/A;  . LEFT HEART CATHETERIZATION WITH  CORONARY ANGIOGRAM N/A 10/06/2014   Procedure: LEFT HEART CATHETERIZATION WITH CORONARY ANGIOGRAM;  Surgeon: Leonie Man, MD;  Location: [facility] CATH LAB;  Service: Cardiovascular;  Laterality: N/A;    Current Medications: Outpatient Medications Prior to Visit  Medication Sig Dispense Refill  . aspirin EC 81 MG tablet Take 81 mg by mouth daily.    Marland Kitchen atenolol (TENORMIN) 50 MG tablet Take 50 mg by mouth daily.     . clopidogrel (PLAVIX) 75 MG tablet TAKE 1 TABLET EVERY DAY 90 tablet 3  . Coenzyme Q10 (CO Q 10 PO) Take 2 capsules by mouth daily.     . furosemide (LASIX) 20 MG tablet Take 20 mg by mouth daily.    . isosorbide mononitrate (IMDUR) 120 MG 24 hr tablet Take 1 tablet (120 mg total) by mouth daily. 30 tablet 6  . Multiple Vitamins-Minerals (ONE-A-DAY MENS 50+ ADVANTAGE) TABS Take 1 tablet by mouth daily.    . nitroGLYCERIN (NITROSTAT) 0.4 MG SL tablet Place 1 tablet (0.4 mg total) under the tongue every 5 (five) minutes x 3 doses as needed for chest pain. 25 tablet 12  . pantoprazole (PROTONIX) 40 MG tablet TAKE 1 TABLET DAILY IF NEEDED (Patient taking differently: TAKE 1 TABLET DAILY) 90 tablet 2  . rosuvastatin (CRESTOR) 20 MG tablet TAKE 1 TABLET EVERY DAY 90 tablet 3  . sitaGLIPtin (JANUVIA) 100 MG tablet Take 100 mg by mouth daily.     No facility-administered medications prior to visit.      Allergies:   Penicillins   Social History   Social History  . Marital status: Divorced    Spouse name: N/A  . Number of children: N/A  . Years of education: N/A   Occupational History  . 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 Smoking status: Former Smoker    Quit date: 06/05/2012  . Smokeless tobacco: Never Used  . Alcohol use No  . Drug use: No  . Sexual activity: Not Asked   Other Topics Concern  . None   Social History Narrative   Lives in [place] by himself.  Family and friends nearby.  He is a Psychologist, clinical.       Family History:  The patient's   family history includes COPD (age of onset: 59) in his mother; Heart attack (age of onset: 77) in his father.   ROS:   Please see the history of present illness.    ROS All other systems reviewed and are negative.   PHYSICAL EXAM:   VS:  BP 132/80 (BP Location: Left Arm, Patient Position: Sitting, Cuff Size: Normal)   Pulse 66   Ht 5' 8.5" (1.74 m)   Wt 214 lb (97.1 kg)   BMI 32.07 kg/m    GEN: Well nourished, well developed, in no acute distress  HEENT: normal  Neck: no JVD, carotid bruits, or masses Cardiac: RRR; no murmurs, rubs, or gallops,no edema  Respiratory:  clear to auscultation bilaterally, normal work of breathing GI: soft, nontender, nondistended, + BS MS: no deformity or atrophy  Skin: warm and dry, no rash Neuro:  Alert and Oriented x 3, Strength and sensation are intact Psych: euthymic mood, full affect  Wt Readings from Last 3 Encounters:  12/19/16 214 lb (97.1 kg)  12/08/16 205 lb (93 kg)  09/18/16 199 lb (90.3 kg)      Studies/Labs Reviewed:   EKG:  EKG is not ordered today.    Recent Labs: 12/08/2016: BUN 17; Creatinine, Ser 0.99; Hemoglobin 14.4; Platelets 222; Potassium 3.8; Sodium 139   Lipid Panel    Component Value Date/Time   CHOL 128 10/06/2014 0505   TRIG 96 10/06/2014 0505   HDL 29 (L) 10/06/2014 0505   CHOLHDL 4.4 10/06/2014 0505   VLDL 19 10/06/2014 0505   LDLCALC 80 10/06/2014 0505    Additional studies/ records that were reviewed today include:   Echo 05/27/2014 LV EF: 55% -  60%  - Left ventricle: The cavity size was normal. Wall thickness was increased in a pattern of mild LVH. Systolic function was normal. The estimated ejection fraction was in the range of 55% to 60%. Wall motion was normal; there were no regional wall motion abnormalities. Doppler parameters are consistent with abnormal left ventricular relaxation (grade 1 diastolic dysfunction). Doppler parameters are consistent with high ventricular  filling pressure. - Aortic root: The aortic root was mildly dilated. - Mitral valve: Calcified annulus.  Impressions:  - Normal LV function; grade 1 diastolic dysfuntion; mild LVH; mildly dilated aortic root.    Myoview 05/28/2014 IMPRESSION: 1. Mixed pattern of reversible and non reversible decreased myocardial perfusion involving the inferior and inferolateral walls of the LEFT ventricle as above.  2. Dyskinetic appearing inferior wall LEFT ventricle.  3. Left ventricular ejection fraction 59%%  4. Moderate-risk stress test findings*.    Cath 06/08/2014 PCI Data: Lesion 1: Vessel - PLA 1 Percent Stenosis (pre) 90 TIMI-flow 3 Stent 2.25x12 mm Promus DES Percent Stenosis (post) 0 TIMI-flow (post) 3  Lesion 2: Vessel - OM2 Percent Stenosis (pre) 90 TIMI-flow 3 Stent 2.25x12 mm Promus DES Percent Stenosis (post) 0 TIMI-flow (post) 3  Radiation dose/Fluoro time: 31.4 minutes  Estimated Blood Loss: minimal  Final Conclusions:  1. Two-vessel coronary artery disease with successful PCI of the obtuse marginal and PLA branches. 2. Continued patency of the RCA stents with mild in-stent restenosis 3. Nonobstructive LAD stenosis 4. Normal LV function by echo assessment  Recommendations:  Continue dual antiplatelet therapy with aspirin and Plavix for another 12 months.    Cath 10/06/2014 Coronary Anatomy:  Dominance: Right   Left Main: Large caliber, long mainstem with a actual septal perforator before trifurcating into the LAD, Ramus Intermedius and Circumflex. Angiographically normal.  OZH:YQMVHQ caliber vessel that is patent to the apex. There are mild diffuse irregularities with a roughly 40% focal lesion in the mid vessel. No high-grade lesions noted. There are 3 diagonal branches that are at least moderate caliber with minimal luminal irregularities.  Left Circumflex:Normal caliber, nondominant vessel with 2 obtuse marginal  branches. First OM is patent and a second OM are widely patent proximal stent. The remainder of both OM vessels are free of disease. The small AV groove branch and terminates as a small posterolateral artery with no significant disease.   OM1:small-moderate caliber vessel; angiographic normal.   OM 2:small-moderate-caliber vessel with widely patent proximal stent.  . Ramus intermedius:moderate large-caliber vessel that bifurcates in the mid  vessel into 2 small moderate caliber branches. Mild luminal irregularities    RCA: Large-caliber, dominant vessel with a high, anterior origin. The vessel is extremely tortuous with at least 3 major bends. It is heavily stented throughout the entire mid segment with mild in-stent stenosis of roughly 30-40%. The vessel bifurcates distally into the RPDA and the Right Posterior AV Groove Branch (RPAV)  RPDA: Moderate caliber vessel. Angiographically normal.  RPL Sysytem:The RPAV begins as a small moderate caliber vessel it bifurcates into 2 posterolateral branches. There is a widely patent stent from the RPAV into RPL 1. No problems with flow in the jailed RPL 2. POST-OPERATIVE DIAGNOSIS:   2+ vessel disease with patent stents throughout the entire mid RCA and posterolateral branch of the RCA as well as OM 2.  Preserved LVEF with moderately elevated LVEDP despite having normal systolic pressures.  Mild (5-10 mm) gradient across aortic valve  No angiographically significant lesion to explain the patient's presentation. Consider possible microvascular ischemia with diastolic dysfunction.   Myoview 06/13/2016 Study Highlights     The left ventricular ejection fraction is normal (55-65%).  Nuclear stress EF: 62%.  There was no ST segment deviation noted during stress.  This is a low risk study.  1. EF 62%, normal wall motion.  2. Fixed medium-sized, mild basal to mid inferior perfusion defect. Given normal inferior wall motion, I suspect  this is more likely attenuation than infarction. No ischemia.  3. Low risk study.       ASSESSMENT:    1. Coronary artery disease involving native coronary artery of native heart with other form of angina pectoris ([place])   2. Bilateral carotid artery disease ([place])   3. Essential hypertension   4. Hyperlipidemia, unspecified hyperlipidemia type   5. Controlled type 2 diabetes mellitus without complication, without long-term current use of insulin (HCC)      PLAN:  In order of problems listed above:  1. Coronary artery disease: He went back to the emergency room again recently for very atypical chest pain. He has never experienced this type of chest discomfort before. Symptom resolved after he was started on 120 mg daily of Imdur. He has not had any further symptom in the past 10 days. He has known coronary artery disease. Last PCI 2.25 x 12 mm Promus DES placed in PLA 1and 2.5 x 12 mm Promus DES placed in OM 2 by Dr. Burt Knack on 06/08/2014.  2. Bilateral carotid artery disease: he is due to have repeat carotid artery disease, last carotid US 08/24/2014  3. Hypertension: Blood pressure stable after increasing Imdur to 120 mg daily. Also on atenolol  4. Hyperlipidemia: On Crestor 20 mg daily, I have not seen a lipid panel since 2016, will defer to primary care physician.  5. DM II: On Januvia    Medication Adjustments/Labs and Tests Ordered: Current medicines are reviewed at length with the patient today.  Concerns regarding medicines are outlined above.  Medication changes, Labs and Tests ordered today are listed in the Patient Instructions below. Patient Instructions  Medication Instructions:   No changes  Labwork:   none  Testing/Procedures:  none  Follow-Up:  With Dr. Gwenlyn Found on Sept 18th (Tuesday) at 2:45pm Call us beforehand if you're having recurrence of chest symptoms as we may need to bring you in sooner for PA appointment.  If you need a refill on your cardiac  medications before your next appointment, please call your pharmacy.      Signed, Almyra Deforest, PA  12/19/2016 8:43 AM    [address], [place], [place]  [address] Phone: [phone]; Fax: [phone]

## 2016-12-28 ENCOUNTER — Other Ambulatory Visit: Payer: Self-pay | Admitting: Cardiovascular Disease

## 2017-01-05 DIAGNOSIS — I10 Essential (primary) hypertension: Secondary | ICD-10-CM | POA: Diagnosis not present

## 2017-01-05 DIAGNOSIS — I25119 Atherosclerotic heart disease of native coronary artery with unspecified angina pectoris: Secondary | ICD-10-CM | POA: Diagnosis not present

## 2017-01-05 DIAGNOSIS — E119 Type 2 diabetes mellitus without complications: Secondary | ICD-10-CM | POA: Diagnosis not present

## 2017-02-13 DIAGNOSIS — I10 Essential (primary) hypertension: Secondary | ICD-10-CM | POA: Diagnosis not present

## 2017-02-13 DIAGNOSIS — I25119 Atherosclerotic heart disease of native coronary artery with unspecified angina pectoris: Secondary | ICD-10-CM | POA: Diagnosis not present

## 2017-02-13 DIAGNOSIS — E119 Type 2 diabetes mellitus without complications: Secondary | ICD-10-CM | POA: Diagnosis not present

## 2017-02-13 DIAGNOSIS — Z7984 Long term (current) use of oral hypoglycemic drugs: Secondary | ICD-10-CM | POA: Diagnosis not present

## 2017-02-27 ENCOUNTER — Encounter: Payer: Self-pay | Admitting: Cardiovascular Disease

## 2017-02-27 ENCOUNTER — Ambulatory Visit (INDEPENDENT_AMBULATORY_CARE_PROVIDER_SITE_OTHER): Payer: Medicare HMO | Admitting: Cardiovascular Disease

## 2017-02-27 VITALS — BP 138/88 | HR 89 | Ht 68.5 in | Wt 216.2 lb

## 2017-02-27 DIAGNOSIS — I10 Essential (primary) hypertension: Secondary | ICD-10-CM

## 2017-02-27 DIAGNOSIS — I739 Peripheral vascular disease, unspecified: Secondary | ICD-10-CM | POA: Diagnosis not present

## 2017-02-27 DIAGNOSIS — E7800 Pure hypercholesterolemia, unspecified: Secondary | ICD-10-CM

## 2017-02-27 DIAGNOSIS — I2510 Atherosclerotic heart disease of native coronary artery without angina pectoris: Secondary | ICD-10-CM | POA: Diagnosis not present

## 2017-02-27 DIAGNOSIS — I779 Disorder of arteries and arterioles, unspecified: Secondary | ICD-10-CM | POA: Diagnosis not present

## 2017-02-27 DIAGNOSIS — Z9861 Coronary angioplasty status: Secondary | ICD-10-CM

## 2017-02-27 NOTE — Patient Instructions (Signed)
Medication Instructions: Your physician recommends that you continue on your current medications as directed. Please refer to the Current Medication list given to you today.  Testing: Your physician has requested that you have a carotid duplex. This test is an ultrasound of the carotid arteries in your neck. It looks at blood flow through these arteries that supply the brain with blood. Allow one hour for this exam. There are no restrictions or special instructions.  Labwork: I will call Dr. Carlyle Lipa office to request blood work. Please fax any blood work from the [place] to [phone] ATTN: Dr. Gwenlyn Found   Follow-Up: Your physician wants you to follow-up in: 1 year with Dr. Gwenlyn Found. You will receive a reminder letter in the mail two months in advance. If you don't receive a letter, please call our office to schedule the follow-up appointment.  If you need a refill on your cardiac medications before your next appointment, please call your pharmacy.

## 2017-02-27 NOTE — Assessment & Plan Note (Addendum)
History of claudication with normal lower extremity arterial Doppler studies 08/24/14.

## 2017-02-27 NOTE — Assessment & Plan Note (Signed)
History of hyperlipidemia on statin therapy followed by his PCP 

## 2017-02-27 NOTE — Assessment & Plan Note (Signed)
History of essential hypertension with blood pressure measured at 138/88. He is on no antihypertensive medications other than atenolol. Continue current meds at current dosing

## 2017-02-27 NOTE — Assessment & Plan Note (Signed)
History of CAD status post stenting several years ago at [facility]. He underwent cardiac catheterization by Dr. Burt Knack and stenting using drug-eluting stents. He had a negative Myoview stress test 06/13/16. His Doris recently adjusted by Almyra Deforest  [facility] and he said no recurrent chest pain.

## 2017-02-27 NOTE — Progress Notes (Signed)
02/27/2017 Reginald Little   November 11, 1950  [id number]  Primary Physician Lajean Manes, MD Primary Cardiologist: Lorretta Harp MD [name], [place]  HPI:  Reginald Little is a 66 y.o. male  mildly overweight divorced Caucasian male with history of CAD status post stenting of his coronary arteries several years ago at [facility] using drug-eluting stents.I last saw him in the office 04/04/16.His history is also remarkable for remote tobacco abuse, treated hypertension and hyperlipidemia. He had a left carotid endarterectomy performed at the [facility] in [place] number of years ago as well. He was admitted to the hospital in December with chest pain. A Myoview stress test was high risk and he underwent cardiac catheterization by Dr. Burt Knack stenting to coronary arteries using drug-eluting stents. He had some chest pain since which is improved with the addition of long-acting oral nitrate. He does complain of left hip claudication as well. He was admitted to the hospital for observation overnight 10/21/14 because of witnessed syncope while chronic rehabilitation. His nitrates were decreased in dose. He did have lower extremity artery Doppler studies which were normal ruling out peripheral vascular disease as a cause of his left hip pain. He was seen in emergency room and dilated by Dr. Berdine Addison T, and his pain quickly resolved with supplemental glycerin. He was seen by Almyra Deforest PAC in the office 12/19/16 and his Imdur was adjusted. He's had no recurrent chest pain.   Current Meds  Medication Sig  . aspirin EC 81 MG tablet Take 81 mg by mouth daily.  Marland Kitchen atenolol (TENORMIN) 50 MG tablet Take 50 mg by mouth daily.   . clopidogrel (PLAVIX) 75 MG tablet TAKE 1 TABLET EVERY DAY  . Coenzyme Q10 (CO Q 10 PO) Take 2 capsules by mouth daily.   . furosemide (LASIX) 20 MG tablet TAKE 1 TABLET EVERY DAY  . isosorbide mononitrate (IMDUR) 120 MG 24 hr tablet Take 1 tablet (120 mg total) by  mouth daily.  . Multiple Vitamins-Minerals (ONE-A-DAY MENS 50+ ADVANTAGE) TABS Take 1 tablet by mouth daily.  . nitroGLYCERIN (NITROSTAT) 0.4 MG SL tablet Place 1 tablet (0.4 mg total) under the tongue every 5 (five) minutes x 3 doses as needed for chest pain.  . pantoprazole (PROTONIX) 40 MG tablet TAKE 1 TABLET DAILY IF NEEDED (Patient taking differently: TAKE 1 TABLET DAILY)  . rosuvastatin (CRESTOR) 20 MG tablet TAKE 1 TABLET EVERY DAY  . sitaGLIPtin (JANUVIA) 100 MG tablet Take 100 mg by mouth daily.     Allergies  Allergen Reactions  . Penicillins Rash    Has patient had a PCN reaction causing immediate rash, facial/tongue/throat swelling, SOB or lightheadedness with hypotension: YES Has patient had a PCN reaction causing severe rash involving mucus membranes or skin necrosis: NO Has patient had a PCN reaction that required hospitalization NO Has patient had a PCN reaction occurring within the last 10 years: NO If all of the above answers are "NO", then may proceed with Cephalosporin use.     Social History   Social History  . Marital status: Divorced    Spouse name: N/A  . Number of children: N/A  . Years of education: N/A   Occupational History  . 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 Smoking status: Former Smoker    Quit date: 06/05/2012  . Smokeless tobacco: Never Used  . Alcohol use No  . Drug use: No  . Sexual activity:  Not on file   Other Topics Concern  . Not on file   Social History Narrative   Lives in [place] by himself.  Family and friends nearby.  He is a Psychologist, clinical.       Review of Systems: General: negative for chills, fever, night sweats or weight changes.  Cardiovascular: negative for chest pain, dyspnea on exertion, edema, orthopnea, palpitations, paroxysmal nocturnal dyspnea or shortness of breath Dermatological: negative for rash Respiratory: negative for cough or wheezing Urologic: negative for hematuria Abdominal:  negative for nausea, vomiting, diarrhea, bright red blood per rectum, melena, or hematemesis Neurologic: negative for visual changes, syncope, or dizziness All other systems reviewed and are otherwise negative except as noted above.    Blood pressure 138/88, pulse 89, height 5' 8.5" (1.74 m), weight 216 lb 3.2 oz (98.1 kg).  General appearance: alert and no distress Neck: no adenopathy, no carotid bruit, no JVD, supple, symmetrical, trachea midline and thyroid not enlarged, symmetric, no tenderness/mass/nodules Lungs: clear to auscultation bilaterally Heart: regular rate and rhythm, S1, S2 normal, no murmur, click, rub or gallop Extremities: extremities normal, atraumatic, no cyanosis or edema Pulses: 2+ and symmetric Skin: Skin color, texture, turgor normal. No rashes or lesions Neurologic: Alert and oriented X 3, normal strength and tone. Normal symmetric reflexes. Normal coordination and gait  EKG  Not performed  tday  ASSESSMENT AND PLAN:   Essential hypertension History of essential hypertension with blood pressure measured at 138/88. He is on no antihypertensive medications other than atenolol. Continue current meds at current dosing  Hyperlipidemia History of hyperlipidemia on statin therapy followed by his PCP.  Carotid artery disease ([place]) History of carotid artery disease status post left carotid endarterectomy at the [facility] in [place] back in 2010. Will recheck carotid Doppler studies.  Claudication History of claudication with normal lower extremity arterial Doppler studies 08/24/14.  CAD S/P percutaneous coronary angioplasty History of CAD status post stenting several years ago at [facility]. He underwent cardiac catheterization by Dr. Burt Knack and stenting using drug-eluting stents. He had a negative Myoview stress test 06/13/16. His Doris recently adjusted by Almyra Deforest  [facility] and he said no recurrent chest pain.      Lorretta Harp MD  FACP,FACC,FAHA, [facility] 02/27/2017 3:15 PM

## 2017-02-27 NOTE — Assessment & Plan Note (Signed)
History of carotid artery disease status post left carotid endarterectomy at the [facility] in [place] back in 2010. Will recheck carotid Doppler studies.

## 2017-02-28 DIAGNOSIS — H1031 Unspecified acute conjunctivitis, right eye: Secondary | ICD-10-CM | POA: Diagnosis not present

## 2017-03-08 ENCOUNTER — Ambulatory Visit (HOSPITAL_COMMUNITY)
Admission: RE | Admit: 2017-03-08 | Discharge: 2017-03-08 | Disposition: A | Discharge status: Home / Self Care | Payer: Medicare HMO | Source: Ambulatory Visit | Attending: Cardiology | Admitting: Cardiology

## 2017-03-08 DIAGNOSIS — I6523 Occlusion and stenosis of bilateral carotid arteries: Secondary | ICD-10-CM | POA: Insufficient documentation

## 2017-03-08 DIAGNOSIS — I779 Disorder of arteries and arterioles, unspecified: Secondary | ICD-10-CM | POA: Insufficient documentation

## 2017-03-08 DIAGNOSIS — I739 Peripheral vascular disease, unspecified: Secondary | ICD-10-CM

## 2017-03-12 DIAGNOSIS — Z6832 Body mass index (BMI) 32.0-32.9, adult: Secondary | ICD-10-CM | POA: Diagnosis not present

## 2017-03-12 DIAGNOSIS — I10 Essential (primary) hypertension: Secondary | ICD-10-CM | POA: Diagnosis not present

## 2017-03-12 DIAGNOSIS — L989 Disorder of the skin and subcutaneous tissue, unspecified: Secondary | ICD-10-CM | POA: Diagnosis not present

## 2017-03-12 DIAGNOSIS — Z79899 Other long term (current) drug therapy: Secondary | ICD-10-CM | POA: Diagnosis not present

## 2017-03-12 DIAGNOSIS — L821 Other seborrheic keratosis: Secondary | ICD-10-CM | POA: Diagnosis not present

## 2017-03-12 DIAGNOSIS — Z1389 Encounter for screening for other disorder: Secondary | ICD-10-CM | POA: Diagnosis not present

## 2017-03-12 DIAGNOSIS — E7800 Pure hypercholesterolemia, unspecified: Secondary | ICD-10-CM | POA: Diagnosis not present

## 2017-03-12 DIAGNOSIS — E119 Type 2 diabetes mellitus without complications: Secondary | ICD-10-CM | POA: Diagnosis not present

## 2017-03-12 DIAGNOSIS — E669 Obesity, unspecified: Secondary | ICD-10-CM | POA: Diagnosis not present

## 2017-03-12 DIAGNOSIS — Z125 Encounter for screening for malignant neoplasm of prostate: Secondary | ICD-10-CM | POA: Diagnosis not present

## 2017-03-12 DIAGNOSIS — Z0000 Encounter for general adult medical examination without abnormal findings: Secondary | ICD-10-CM | POA: Diagnosis not present

## 2017-03-12 DIAGNOSIS — K219 Gastro-esophageal reflux disease without esophagitis: Secondary | ICD-10-CM | POA: Diagnosis not present

## 2017-03-12 DIAGNOSIS — M79605 Pain in left leg: Secondary | ICD-10-CM | POA: Diagnosis not present

## 2017-03-13 ENCOUNTER — Other Ambulatory Visit: Payer: Self-pay | Admitting: Cardiovascular Disease

## 2017-03-13 DIAGNOSIS — I779 Disorder of arteries and arterioles, unspecified: Secondary | ICD-10-CM

## 2017-03-13 DIAGNOSIS — I739 Peripheral vascular disease, unspecified: Principal | ICD-10-CM

## 2017-03-14 DIAGNOSIS — I10 Essential (primary) hypertension: Secondary | ICD-10-CM | POA: Diagnosis not present

## 2017-03-23 ENCOUNTER — Other Ambulatory Visit: Payer: Self-pay | Admitting: Geriatric Medicine

## 2017-03-23 DIAGNOSIS — M79605 Pain in left leg: Secondary | ICD-10-CM

## 2017-03-27 DIAGNOSIS — L218 Other seborrheic dermatitis: Secondary | ICD-10-CM | POA: Diagnosis not present

## 2017-03-27 DIAGNOSIS — L821 Other seborrheic keratosis: Secondary | ICD-10-CM | POA: Diagnosis not present

## 2017-03-28 ENCOUNTER — Ambulatory Visit
Admission: RE | Admit: 2017-03-28 | Discharge: 2017-03-28 | Disposition: A | Discharge status: Home / Self Care | Payer: Medicare HMO | Source: Ambulatory Visit | Attending: Geriatric Medicine | Admitting: Geriatric Medicine

## 2017-03-28 DIAGNOSIS — M79605 Pain in left leg: Secondary | ICD-10-CM

## 2017-03-28 DIAGNOSIS — M7989 Other specified soft tissue disorders: Secondary | ICD-10-CM | POA: Diagnosis not present

## 2017-04-12 ENCOUNTER — Encounter: Payer: Self-pay | Admitting: Physician Assistant

## 2017-04-12 ENCOUNTER — Emergency Department (HOSPITAL_COMMUNITY): Payer: Medicare HMO

## 2017-04-12 ENCOUNTER — Emergency Department (HOSPITAL_COMMUNITY)
Admission: EM | Admit: 2017-04-12 | Discharge: 2017-04-12 | Disposition: A | Discharge status: Home / Self Care | Payer: Medicare HMO | Attending: Emergency Medicine | Admitting: Emergency Medicine

## 2017-04-12 ENCOUNTER — Encounter (HOSPITAL_COMMUNITY): Payer: Self-pay

## 2017-04-12 DIAGNOSIS — Z87891 Personal history of nicotine dependence: Secondary | ICD-10-CM | POA: Insufficient documentation

## 2017-04-12 DIAGNOSIS — I10 Essential (primary) hypertension: Secondary | ICD-10-CM | POA: Insufficient documentation

## 2017-04-12 DIAGNOSIS — E119 Type 2 diabetes mellitus without complications: Secondary | ICD-10-CM | POA: Diagnosis not present

## 2017-04-12 DIAGNOSIS — I2510 Atherosclerotic heart disease of native coronary artery without angina pectoris: Secondary | ICD-10-CM | POA: Diagnosis not present

## 2017-04-12 DIAGNOSIS — R079 Chest pain, unspecified: Secondary | ICD-10-CM

## 2017-04-12 DIAGNOSIS — R0789 Other chest pain: Secondary | ICD-10-CM | POA: Diagnosis not present

## 2017-04-12 DIAGNOSIS — Z79899 Other long term (current) drug therapy: Secondary | ICD-10-CM | POA: Diagnosis not present

## 2017-04-12 LAB — CBC
HCT: 43.6 % (ref 39.0–52.0)
Hemoglobin: 14.6 g/dL (ref 13.0–17.0)
MCH: 30.2 pg (ref 26.0–34.0)
MCHC: 33.5 g/dL (ref 30.0–36.0)
MCV: 90.3 fL (ref 78.0–100.0)
Platelets: 219 10*3/uL (ref 150–400)
RBC: 4.83 MIL/uL (ref 4.22–5.81)
RDW: 13.3 % (ref 11.5–15.5)
WBC: 7.1 10*3/uL (ref 4.0–10.5)

## 2017-04-12 LAB — I-STAT TROPONIN, ED
Troponin i, poc: 0 ng/mL (ref 0.00–0.08)
Troponin i, poc: 0 ng/mL (ref 0.00–0.08)

## 2017-04-12 LAB — BASIC METABOLIC PANEL
Anion gap: 8 (ref 5–15)
BUN: 10 mg/dL (ref 6–20)
CALCIUM: 9.2 mg/dL (ref 8.9–10.3)
CHLORIDE: 100 mmol/L — AB (ref 101–111)
CO2: 32 mmol/L (ref 22–32)
CREATININE: 1.04 mg/dL (ref 0.61–1.24)
GFR calc Af Amer: >60 mL/min (ref >60)
GFR calc non Af Amer: >60 mL/min (ref >60)
Glucose, Bld: 131 mg/dL — ABNORMAL HIGH (ref 65–99)
Potassium: 3.6 mmol/L (ref 3.5–5.1)
SODIUM: 140 mmol/L (ref 135–145)

## 2017-04-12 NOTE — ED Provider Notes (Signed)
[facility] EMERGENCY DEPARTMENT Provider Note   CSN: [id number] Arrival date & time: 04/12/17  1618     History   Chief Complaint Chief Complaint  Patient presents with  . Chest Pain    HPI Reginald Little is a 66 y.o. male.  HPI  66 year old man history of coronary artery disease status post stent 2013 in 2015 with low risk myocardial perfusion study January 2018, last seen by Dr. Alvester Chou 918.  Presents today complaining of sharp left-sided chest pain that began yesterday while watching TV.  He describes these as lasting seconds and coming intermittently.  Yesterday they lasted intermittently through the afternoon until after he took his Imdur.  They resolved until today.  He notes these to be sharp   He states that they are present when he is resting and when he moves in certain positions.  He states that they are relieved when exerting himself. This afternoon he again noted these sharp pains.  He states that they are present when he is resting and when he moves in certain positions.  He states that exerting himself relieves the pain.  He denies any swelling in his legs, history of DVT, or PE  Past Medical History:  Diagnosis Date  . Atypical chest pain   . Carotid artery disease ([place])    a. s/p L CEA  . Coronary artery disease    a. s/p DES x 2 to the RCA (05/2012 at [facility]) b. 05/2014 Abnl MV w/ inf/inflat partially rev defect;  c. s/p DES of OM2 and RPL1 (05/2014)  d. s/p LHC on 10/06/14 with patent stents and non-obs CAD; e. 06/2016 MV: low risk, EF 62%, fixed medium-sized, mild basal and inf defect - likely attenuation. No ischemia.  . Diabetes mellitus without complication ([place])   . GERD (gastroesophageal reflux disease)   . HLD (hyperlipidemia)   . Hypertension   . Obesity   . Syncope    a. 10/2014 - felt to be 2/2 hypotension after recent imdur titration.    Patient Active Problem List   Diagnosis Date Noted  . GERD (gastroesophageal reflux disease)   .  Coronary artery disease   . Hypertension   . Syncope 10/21/2014  . Dyspnea on exertion   . CAD S/P percutaneous coronary angioplasty   . Exertional angina ([facility])   . Hyperlipidemia 08/18/2014  . Carotid artery disease ([place]) 08/18/2014  . Claudication ([place]) 08/18/2014  . Left hip pain 07/07/2014  . Essential hypertension 07/07/2014  . Obesity (BMI 30.0-34.9) 07/07/2014  . Chest pain 06/06/2014    Past Surgical History:  Procedure Laterality Date  . CAROTID ENDARTERECTOMY    . CORONARY ANGIOPLASTY WITH STENT PLACEMENT    . LEFT HEART CATHETERIZATION WITH CORONARY ANGIOGRAM N/A 06/08/2014   Procedure: LEFT HEART CATHETERIZATION WITH CORONARY ANGIOGRAM;  Surgeon: Blane Ohara, MD;  Location[facility] CATH LAB;  Service: Cardiovascular;  Laterality: N/A;  . LEFT HEART CATHETERIZATION WITH CORONARY ANGIOGRAM N/A 10/06/2014   Procedure: LEFT HEART CATHETERIZATION WITH CORONARY ANGIOGRAM;  Surgeon: Leonie Man, MD;  Location: [facility] CATH LAB;  Service: Cardiovascular;  Laterality: N/A;       Home Medications    Prior to Admission medications   Medication Sig Start Date End Date Taking? Authorizing Provider  acetaminophen (TYLENOL) 500 MG tablet Take 500 mg by mouth every 6 (six) hours as needed for mild pain or headache.   Yes [provider]  aspirin EC 81 MG tablet Take 81 mg by  mouth daily.   Yes [provider]  atenolol (TENORMIN) 50 MG tablet Take 50 mg by mouth daily.  04/04/16  Yes [provider]  Cholecalciferol (VITAMIN D3) 1000 units CAPS Take 1,000 Units by mouth daily.   Yes [provider]  clopidogrel (PLAVIX) 75 MG tablet TAKE 1 TABLET EVERY DAY Patient taking differently: Take 75 mg by mouth once a day 06/08/16  Yes Lorretta Harp, MD  Coenzyme Q10 (CO Q-10) 300 MG CAPS Take 300 mg by mouth daily.   Yes [provider]  furosemide (LASIX) 20 MG tablet TAKE 1 TABLET EVERY DAY Patient taking differently: Take 20 mg by mouth  once a day 12/28/16  Yes Lorretta Harp, MD  isosorbide mononitrate (IMDUR) 120 MG 24 hr tablet Take 1 tablet (120 mg total) by mouth daily. 12/09/16  Yes Rogelia Mire, NP  Multiple Vitamins-Minerals (ONE-A-DAY MENS 50+ ADVANTAGE) TABS Take 1 tablet by mouth daily.   Yes [provider]  nitroGLYCERIN (NITROSTAT) 0.4 MG SL tablet Place 1 tablet (0.4 mg total) under the tongue every 5 (five) minutes x 3 doses as needed for chest pain. 10/06/14  Yes Eileen Stanford, PA-C  pantoprazole (PROTONIX) 40 MG tablet TAKE 1 TABLET DAILY IF NEEDED Patient taking differently: Take 40 mg by mouth once a day 12/05/16  Yes Almyra Deforest, PA  rosuvastatin (CRESTOR) 20 MG tablet TAKE 1 TABLET EVERY DAY Patient taking differently: Take 20 mg by mouth at bedtime 06/08/16  Yes Lorretta Harp, MD  sitaGLIPtin (JANUVIA) 100 MG tablet Take 100 mg by mouth daily.   Yes [provider]    Family History Family History  Problem Relation Age of Onset  . Heart attack Father 71  . COPD Mother 20    Social History Social History  Substance Use Topics  . Smoking status: Former Smoker    Quit date: 06/05/2012  . Smokeless tobacco: Never Used  . Alcohol use No     Allergies   Penicillins   Review of Systems Review of Systems  All other systems reviewed and are negative.    Physical Exam Updated Vital Signs BP (!) 169/85   Pulse 81   Temp 98 F (36.7 C) (Oral)   Resp (!) 21   Wt 95.3 kg (210 lb)   SpO2 100%   BMI 31.47 kg/m   Physical Exam  Constitutional: He is oriented to person, place, and time. He appears well-developed.  HENT:  Head: Normocephalic and atraumatic.  Right Ear: External ear normal.  Left Ear: External ear normal.  Eyes: Pupils are equal, round, and reactive to light. EOM are normal.  Neck: Normal range of motion. Neck supple.  Cardiovascular: Normal rate, regular rhythm, normal heart sounds and intact distal pulses.   Pulmonary/Chest: Effort  normal and breath sounds normal.  Abdominal: Soft. Bowel sounds are normal.  Musculoskeletal: Normal range of motion.  Neurological: He is alert and oriented to person, place, and time.  Skin: Skin is warm and dry. Capillary refill takes less than 2 seconds.  Psychiatric: He has a normal mood and affect. His behavior is normal.  Nursing note and vitals reviewed.    ED Treatments / Results  Labs (all labs ordered are listed, but only abnormal results are displayed) Labs Reviewed  BASIC METABOLIC PANEL - Abnormal; Notable for the following:       Result Value   Chloride 100 (*)    Glucose, Bld 131 (*)    All  other components within normal limits  CBC  I-STAT TROPONIN, ED  I-STAT TROPONIN, ED    EKG  EKG Interpretation  Date/Time:  Thursday April 12 2017 16:27:15 EDT Ventricular Rate:  79 PR Interval:  152 QRS Duration: 92 QT Interval:  390 QTC Calculation: 447 R Axis:   46 Text Interpretation:  Normal sinus rhythm Normal ECG Confirmed by Pattricia Boss [id number]) on 04/12/2017 7:13:48 PM       Radiology Dg Chest 2 View  Result Date: 04/12/2017 CLINICAL DATA:  Pt reports left sided sharp stabbing chest pain and right arm pain with some dizziness that began yesterday while watching TV. PT states BP has been running high (156/72) so he called cardiologist and was instructed to come to the emergency department. Nausea and diaphoresis. EXAM: CHEST  2 VIEW COMPARISON:  12/08/2016 FINDINGS: The heart size and mediastinal contours are within normal limits. Both lungs are clear. The visualized skeletal structures are unremarkable. IMPRESSION: No active cardiopulmonary disease. Electronically Signed   By: Nolon Nations M.D.   On: 04/12/2017 16:48    Procedures Procedures (including critical care time)  Medications Ordered in ED Medications - No data to display   Initial Impression / Assessment and Plan / ED Course  I have reviewed the triage vital signs and the nursing  notes.  Pertinent labs & imaging results that were available during my care of the patient were reviewed by me and considered in my medical decision making (see chart for details).    66 year old man history of coronary artery disease status post stenting to RCA in 2013, stenting in 2015 left heart cath 2016 with patent stents and nonobstructive coronary artery disease, Myoview 1-18 low risk EF 62% who presents today with atypical chest pain.  Patient's pain began yesterday.  His EKG and troponins are normal.  Pain is sharp and very in duration.  We have discussed return precautions and need for close follow-up and is understanding   Final Clinical Impressions(s) / ED Diagnoses   Final diagnoses:  Chest pain, unspecified type    New Prescriptions New Prescriptions   No medications on file     Pattricia Boss, MD 04/12/17 2210

## 2017-04-12 NOTE — ED Notes (Signed)
Pt verbalizes understanding of d/c instructions. Pt ambulatory at d/c with all belongings.   

## 2017-04-12 NOTE — Discharge Instructions (Signed)
Please call Dr. Brien Mates office tomorrow for follow-up as soon as possible. Return here if you are worse at any time especially worsening chest pain or shortness of breath.

## 2017-04-12 NOTE — ED Triage Notes (Signed)
Pt reports left sided chest pain and right arm pain with some dizziness that began yesterday while watching tv. PT states bp has been running high (156/72) so he called cardiologist and was instructed to com to ed. Pt endorses nausea and diaphoresis. A&Ox 4.

## 2017-04-12 NOTE — ED Notes (Signed)
ED Provider at bedside. 

## 2017-04-16 ENCOUNTER — Other Ambulatory Visit: Payer: Self-pay | Admitting: *Deleted

## 2017-04-16 MED ORDER — NITROGLYCERIN 0.4 MG SL SUBL: 0.4000 mg | SUBLINGUAL_TABLET | SUBLINGUAL | 12 refills | Status: DC | PRN

## 2017-04-23 ENCOUNTER — Other Ambulatory Visit: Payer: Self-pay | Admitting: Cardiovascular Disease

## 2017-04-23 ENCOUNTER — Other Ambulatory Visit: Payer: Self-pay | Admitting: *Deleted

## 2017-04-23 DIAGNOSIS — H1031 Unspecified acute conjunctivitis, right eye: Secondary | ICD-10-CM | POA: Diagnosis not present

## 2017-04-23 MED ORDER — NITROGLYCERIN 0.4 MG SL SUBL: 0.4000 mg | SUBLINGUAL_TABLET | SUBLINGUAL | 3 refills | Status: AC | PRN

## 2017-05-11 ENCOUNTER — Emergency Department (HOSPITAL_COMMUNITY): Payer: Medicare HMO

## 2017-05-11 ENCOUNTER — Emergency Department (HOSPITAL_COMMUNITY)
Admission: EM | Admit: 2017-05-11 | Discharge: 2017-05-12 | Disposition: A | Discharge status: Home / Self Care | Payer: Medicare HMO | Attending: Emergency Medicine | Admitting: Emergency Medicine

## 2017-05-11 ENCOUNTER — Encounter (HOSPITAL_COMMUNITY): Payer: Self-pay

## 2017-05-11 DIAGNOSIS — Z87891 Personal history of nicotine dependence: Secondary | ICD-10-CM | POA: Diagnosis not present

## 2017-05-11 DIAGNOSIS — R079 Chest pain, unspecified: Secondary | ICD-10-CM | POA: Diagnosis not present

## 2017-05-11 DIAGNOSIS — R0602 Shortness of breath: Secondary | ICD-10-CM | POA: Diagnosis not present

## 2017-05-11 DIAGNOSIS — I2510 Atherosclerotic heart disease of native coronary artery without angina pectoris: Secondary | ICD-10-CM | POA: Insufficient documentation

## 2017-05-11 DIAGNOSIS — Z79899 Other long term (current) drug therapy: Secondary | ICD-10-CM | POA: Diagnosis not present

## 2017-05-11 DIAGNOSIS — I10 Essential (primary) hypertension: Secondary | ICD-10-CM | POA: Insufficient documentation

## 2017-05-11 DIAGNOSIS — Z955 Presence of coronary angioplasty implant and graft: Secondary | ICD-10-CM | POA: Insufficient documentation

## 2017-05-11 DIAGNOSIS — R0609 Other forms of dyspnea: Secondary | ICD-10-CM | POA: Diagnosis not present

## 2017-05-11 DIAGNOSIS — I25119 Atherosclerotic heart disease of native coronary artery with unspecified angina pectoris: Secondary | ICD-10-CM | POA: Diagnosis not present

## 2017-05-11 DIAGNOSIS — E119 Type 2 diabetes mellitus without complications: Secondary | ICD-10-CM | POA: Insufficient documentation

## 2017-05-11 DIAGNOSIS — R7989 Other specified abnormal findings of blood chemistry: Secondary | ICD-10-CM | POA: Diagnosis not present

## 2017-05-11 DIAGNOSIS — Z7982 Long term (current) use of aspirin: Secondary | ICD-10-CM | POA: Diagnosis not present

## 2017-05-11 LAB — COMPREHENSIVE METABOLIC PANEL
ALT: 34 U/L (ref 17–63)
ANION GAP: 8 (ref 5–15)
AST: 28 U/L (ref 15–41)
Albumin: 4 g/dL (ref 3.5–5.0)
Alkaline Phosphatase: 75 U/L (ref 38–126)
BILIRUBIN TOTAL: 1.8 mg/dL — AB (ref 0.3–1.2)
BUN: 16 mg/dL (ref 6–20)
CO2: 29 mmol/L (ref 22–32)
Calcium: 9.1 mg/dL (ref 8.9–10.3)
Chloride: 102 mmol/L (ref 101–111)
Creatinine, Ser: 0.95 mg/dL (ref 0.61–1.24)
Glucose, Bld: 122 mg/dL — ABNORMAL HIGH (ref 65–99)
POTASSIUM: 4.1 mmol/L (ref 3.5–5.1)
Sodium: 139 mmol/L (ref 135–145)
TOTAL PROTEIN: 7.3 g/dL (ref 6.5–8.1)

## 2017-05-11 LAB — CBC WITH DIFFERENTIAL/PLATELET
Basophils Absolute: 0 10*3/uL (ref 0.0–0.1)
Basophils Relative: 1 %
EOS PCT: 6 %
Eosinophils Absolute: 0.4 10*3/uL (ref 0.0–0.7)
HEMATOCRIT: 42.4 % (ref 39.0–52.0)
Hemoglobin: 14.1 g/dL (ref 13.0–17.0)
LYMPHS PCT: 28 %
Lymphs Abs: 2.2 10*3/uL (ref 0.7–4.0)
MCH: 30.1 pg (ref 26.0–34.0)
MCHC: 33.3 g/dL (ref 30.0–36.0)
MCV: 90.6 fL (ref 78.0–100.0)
MONO ABS: 0.5 10*3/uL (ref 0.1–1.0)
MONOS PCT: 7 %
NEUTROS ABS: 4.5 10*3/uL (ref 1.7–7.7)
Neutrophils Relative %: 58 %
Platelets: 249 10*3/uL (ref 150–400)
RBC: 4.68 MIL/uL (ref 4.22–5.81)
RDW: 13.5 % (ref 11.5–15.5)
WBC: 7.7 10*3/uL (ref 4.0–10.5)

## 2017-05-11 LAB — I-STAT CHEM 8, ED
BUN: 16 mg/dL (ref 6–20)
CALCIUM ION: 1.11 mmol/L — AB (ref 1.15–1.40)
CHLORIDE: 102 mmol/L (ref 101–111)
Creatinine, Ser: 0.9 mg/dL (ref 0.61–1.24)
Glucose, Bld: 120 mg/dL — ABNORMAL HIGH (ref 65–99)
HEMATOCRIT: 43 % (ref 39.0–52.0)
Hemoglobin: 14.6 g/dL (ref 13.0–17.0)
Potassium: 4.1 mmol/L (ref 3.5–5.1)
SODIUM: 141 mmol/L (ref 135–145)
TCO2: 28 mmol/L (ref 22–32)

## 2017-05-11 LAB — I-STAT TROPONIN, ED: TROPONIN I, POC: 0 ng/mL (ref 0.00–0.08)

## 2017-05-11 MED ORDER — IOPAMIDOL (ISOVUE-370) INJECTION 76%
INTRAVENOUS | Status: AC
  Administered 2017-05-11: 100 mL via INTRAVENOUS
  Filled 2017-05-11: qty 100

## 2017-05-11 NOTE — ED Triage Notes (Signed)
Pt sent from Dr.'s office. His D-Dimer was drawn today and came back elevated at 0.80. He has been experiencing SOB with exertion. He returned from a vacation to the [place] about 10 days ago. A&O. He has a history of CAD, has been cardiac stented (several years ago), and diabetes.

## 2017-05-11 NOTE — ED Notes (Signed)
Patient transported to X-ray 

## 2017-05-11 NOTE — Discharge Instructions (Signed)
Follow-up with your cardiologist this week.  If you have problems over the weekend go to [place] your family doctor about possibly needing another CAT scan of your chest in 6 months

## 2017-05-11 NOTE — ED Provider Notes (Signed)
[facility] DEPT Provider Note   CSN: [id number] Arrival date & time: 05/11/17  1757     History   Chief Complaint Chief Complaint  Patient presents with  . Shortness of Breath  . Abnormal Lab    HPI Reginald Little is a 66 y.o. male.  Patient states she has been having some chest discomfort and was seen by his primary care doctor.  Primary care doctor got a d-dimer that was elevated and sent the patient here for evaluation   The history is provided by the patient.  Chest Pain   This is a new problem. The current episode started more than 1 week ago. The problem occurs rarely. The problem has been resolved. The pain is associated with an emotional upset. The pain is present in the substernal region. The pain is at a severity of 5/10. The pain is moderate. The quality of the pain is described as dull. The pain does not radiate. Pertinent negatives include no abdominal pain, no back pain, no cough and no headaches.  Pertinent negatives for past medical history include no seizures.    Past Medical History:  Diagnosis Date  . Atypical chest pain   . Carotid artery disease ([place])    a. s/p L CEA  . Coronary artery disease    a. s/p DES x 2 to the RCA (05/2012 at [facility]) b. 05/2014 Abnl MV w/ inf/inflat partially rev defect;  c. s/p DES of OM2 and RPL1 (05/2014)  d. s/p LHC on 10/06/14 with patent stents and non-obs CAD; e. 06/2016 MV: low risk, EF 62%, fixed medium-sized, mild basal and inf defect - likely attenuation. No ischemia.  . Diabetes mellitus without complication ([place])   . GERD (gastroesophageal reflux disease)   . HLD (hyperlipidemia)   . Hypertension   . Obesity   . Syncope    a. 10/2014 - felt to be 2/2 hypotension after recent imdur titration.    Patient Active Problem List   Diagnosis Date Noted  . GERD (gastroesophageal reflux disease)   . Coronary artery disease   . Hypertension   . Syncope 10/21/2014  . Dyspnea on exertion   . CAD  S/P percutaneous coronary angioplasty   . Exertional angina ([facility])   . Hyperlipidemia 08/18/2014  . Carotid artery disease ([place]) 08/18/2014  . Claudication ([place]) 08/18/2014  . Left hip pain 07/07/2014  . Essential hypertension 07/07/2014  . Obesity (BMI 30.0-34.9) 07/07/2014  . Chest pain 06/06/2014    Past Surgical History:  Procedure Laterality Date  . CAROTID ENDARTERECTOMY    . CORONARY ANGIOPLASTY WITH STENT PLACEMENT    . LEFT HEART CATHETERIZATION WITH CORONARY ANGIOGRAM N/A 06/08/2014   Procedure: LEFT HEART CATHETERIZATION WITH CORONARY ANGIOGRAM;  Surgeon: Blane Ohara, MD;  Location: [facility] CATH LAB;  Service: Cardiovascular;  Laterality: N/A;  . LEFT HEART CATHETERIZATION WITH CORONARY ANGIOGRAM N/A 10/06/2014   Procedure: LEFT HEART CATHETERIZATION WITH CORONARY ANGIOGRAM;  Surgeon: Leonie Man, MD;  Location: [facility] CATH LAB;  Service: Cardiovascular;  Laterality: N/A;       Home Medications    Prior to Admission medications   Medication Sig Start Date End Date Taking? Authorizing Provider  acetaminophen (TYLENOL) 500 MG tablet Take 500 mg by mouth every 6 (six) hours as needed for mild pain or headache.   Yes [provider]  aspirin EC 81 MG tablet Take 81 mg by mouth daily.   Yes [provider]  atenolol (TENORMIN) 50 MG tablet  Take 50 mg by mouth daily.  04/04/16  Yes [provider]  Cholecalciferol (VITAMIN D3) 1000 units CAPS Take 1,000 Units by mouth daily.   Yes [provider]  clopidogrel (PLAVIX) 75 MG tablet TAKE 1 TABLET EVERY DAY 04/24/17  Yes Lorretta Harp, MD  Coenzyme Q10 (CO Q-10) 300 MG CAPS Take 300 mg by mouth daily.   Yes [provider]  furosemide (LASIX) 20 MG tablet TAKE 1 TABLET EVERY DAY Patient taking differently: Take 20 mg by mouth once a day 12/28/16  Yes Lorretta Harp, MD  isosorbide mononitrate (IMDUR) 120 MG 24 hr tablet Take 1 tablet (120 mg total) by mouth daily. 12/09/16  Yes  Rogelia Mire, NP  moxifloxacin (VIGAMOX) 0.5 % ophthalmic solution Place 1 drop into both eyes daily as needed. 04/23/17  Yes [provider]  Multiple Vitamins-Minerals (ONE-A-DAY MENS 50+ ADVANTAGE) TABS Take 1 tablet by mouth daily.   Yes [provider]  nitroGLYCERIN (NITROSTAT) 0.4 MG SL tablet Place 1 tablet (0.4 mg total) every 5 (five) minutes x 3 doses as needed under the tongue for chest pain. 04/23/17  Yes Lorretta Harp, MD  pantoprazole (PROTONIX) 40 MG tablet TAKE 1 TABLET DAILY IF NEEDED Patient taking differently: Take 40 mg by mouth once a day 12/05/16  Yes Eulas Post, [place], PA  rosuvastatin (CRESTOR) 20 MG tablet TAKE 1 TABLET EVERY DAY 04/24/17  Yes Lorretta Harp, MD  sitaGLIPtin (JANUVIA) 100 MG tablet Take 100 mg by mouth daily.   Yes [provider]  isosorbide mononitrate (IMDUR) 60 MG 24 hr tablet TAKE 1 TABLET EVERY DAY 04/24/17   Lorretta Harp, MD    Family History Family History  Problem Relation Age of Onset  . Heart attack Father 79  . COPD Mother 12    Social History Social History   Tobacco Use  . Smoking status: Former Smoker    Last attempt to quit: 06/05/2012    Years since quitting: 4.9  . Smokeless tobacco: Never Used  Substance Use Topics  . Alcohol use: No  . Drug use: No     Allergies   Penicillins   Review of Systems Review of Systems  Constitutional: Negative for appetite change and fatigue.  HENT: Negative for congestion, ear discharge and sinus pressure.   Eyes: Negative for discharge.  Respiratory: Negative for cough.   Cardiovascular: Positive for chest pain.  Gastrointestinal: Negative for abdominal pain and diarrhea.  Genitourinary: Negative for frequency and hematuria.  Musculoskeletal: Negative for back pain.  Skin: Negative for rash.  Neurological: Negative for seizures and headaches.  Psychiatric/Behavioral: Negative for hallucinations.     Physical Exam Updated Vital  Signs BP (!) 115/58   Pulse 80   Temp 98.5 F (36.9 C) (Oral)   Resp 18   SpO2 94%   Physical Exam  Constitutional: He is oriented to person, place, and time. He appears well-developed.  HENT:  Head: Normocephalic.  Eyes: Conjunctivae and EOM are normal. No scleral icterus.  Neck: Neck supple. No thyromegaly present.  Cardiovascular: Normal rate and regular rhythm. Exam reveals no gallop and no friction rub.  No murmur heard. Pulmonary/Chest: No stridor. He has no wheezes. He has no rales. He exhibits no tenderness.  Abdominal: He exhibits no distension. There is no tenderness. There is no rebound.  Musculoskeletal: Normal range of motion. He exhibits no edema.  Lymphadenopathy:    He has no cervical adenopathy.  Neurological: He is oriented to  person, place, and time. He exhibits normal muscle tone. Coordination normal.  Skin: No rash noted. No erythema.  Psychiatric: He has a normal mood and affect. His behavior is normal.     ED Treatments / Results  Labs (all labs ordered are listed, but only abnormal results are displayed) Labs Reviewed  COMPREHENSIVE METABOLIC PANEL - Abnormal; Notable for the following components:      Result Value   Glucose, Bld 122 (*)    Total Bilirubin 1.8 (*)    All other components within normal limits  I-STAT CHEM 8, ED - Abnormal; Notable for the following components:   Glucose, Bld 120 (*)    Calcium, Ion 1.11 (*)    All other components within normal limits  CBC WITH DIFFERENTIAL/PLATELET  I-STAT TROPONIN, ED    EKG  EKG Interpretation  Date/Time:  Friday May 11 2017 20:07:22 EST Ventricular Rate:  85 PR Interval:    QRS Duration: 92 QT Interval:  397 QTC Calculation: 473 R Axis:   74 Text Interpretation:  Sinus rhythm Confirmed by Milton Ferguson [phone]) on 05/11/2017 11:28:17 PM       Radiology Dg Chest 2 View  Result Date: 05/11/2017 CLINICAL DATA:  Elevated D-dimer.  Shortness of breath EXAM: CHEST  2 VIEW  COMPARISON:  04/12/2017 FINDINGS: EKG leads create artifact over the chest. Normal heart size and mediastinal contours. Coronary stents noted. Borderline airway thickening. No acute infiltrate or edema. No effusion or pneumothorax. No acute osseous findings. IMPRESSION: No acute finding.  Stable compared to prior. Electronically Signed   By: Monte Fantasia M.D.   On: 05/11/2017 20:30   Ct Angio Chest Pe W And/or Wo Contrast  Result Date: 05/11/2017 CLINICAL DATA:  Dyspnea, elevated D-dimer EXAM: CT ANGIOGRAPHY CHEST WITH CONTRAST TECHNIQUE: Multidetector CT imaging of the chest was performed using the standard protocol during bolus administration of intravenous contrast. Multiplanar CT image reconstructions and MIPs were obtained to evaluate the vascular anatomy. CONTRAST:  80 mL Isovue 370 intravenous COMPARISON:  05/11/2017 radiograph FINDINGS: Cardiovascular: Satisfactory opacification of the pulmonary arteries to the segmental level. No evidence of pulmonary embolism. Normal heart size. No pericardial effusion. Nonaneurysmal aorta. Atherosclerotic calcifications. Coronary artery calcification. Mediastinum/Nodes: Calcified mediastinal lymph nodes consistent with prior granulomatous disease. Midline trachea. No thyroid mass. Esophagus within normal limits. Lungs/Pleura: Calcified granuloma in the left lung base. 5 mm ground-glass density in the right middle lobe, series 7, image number 58. Similar appearing 4 mm ground-glass density in the subpleural right upper lobe anteriorly, series 7, image number 50. No consolidation or effusion. No pneumothorax. Upper Abdomen: Possible small stones in the gallbladder, incompletely visualized. Musculoskeletal: No chest wall abnormality. No acute or significant osseous findings. Review of the MIP images confirms the above findings. IMPRESSION: 1. Negative for acute pulmonary embolus or aortic dissection 2. Findings consistent prior granulomatous disease. 3. Small  ground-glass nodules in the right middle and upper lobes. Non-contrast chest CT at 3-6 months is recommended. If nodules persist and are stable at that time, consider additional non-contrast chest CT examinations at 2 and 4 years. This recommendation follows the consensus statement: Guidelines for Management of Incidental Pulmonary Nodules Detected on CT Images: From the Fleischner Society 2017; Radiology 2017; 284:228-243. 4. Possible small gallstones Aortic Atherosclerosis (ICD10-I70.0). Electronically Signed   By: Donavan Foil M.D.   On: 05/11/2017 22:46    Procedures Procedures (including critical care time)  Medications Ordered in ED Medications  iopamidol (ISOVUE-370) 76 % injection (100 mLs Intravenous Contrast  Given 05/11/17 2205)     Initial Impression / Assessment and Plan / ED Course  I have reviewed the triage vital signs and the nursing notes.  Pertinent labs & imaging results that were available during my care of the patient were reviewed by me and considered in my medical decision making (see chart for details).     CT of the chest for PE was negative.  Patient will be discharged home.  He no longer has any chest discomfort.  He is instructed to follow-up with cardiologist this week.  He is to return sooner if any problems.  Final Clinical Impressions(s) / ED Diagnoses   Final diagnoses:  Chest pain in adult    ED Discharge Orders    None       Milton Ferguson, MD 05/11/17 2338

## 2017-05-11 NOTE — ED Notes (Signed)
Called for triage x 1.  No answer. 

## 2017-05-16 ENCOUNTER — Telehealth: Payer: Self-pay | Admitting: Cardiovascular Disease

## 2017-05-16 ENCOUNTER — Encounter: Payer: Self-pay | Admitting: Physician Assistant

## 2017-05-16 NOTE — Telephone Encounter (Signed)
New message  Barb from [place] called to f/u on surgical clearance for pt. Please call back to discuss

## 2017-05-16 NOTE — Telephone Encounter (Signed)
Returned call to [place] with [facility] left message we have not received a surgical clearance.Advised to refax clearance to office at fax # [phone].

## 2017-05-17 ENCOUNTER — Telehealth: Payer: Self-pay | Admitting: *Deleted

## 2017-05-17 ENCOUNTER — Telehealth: Payer: Self-pay | Admitting: Cardiovascular Disease

## 2017-05-17 NOTE — Telephone Encounter (Signed)
Pamala Hurry calling from [place] to know today if pt can stop his Plavix.Please put in Epic or fax asap to [phone]

## 2017-05-17 NOTE — Telephone Encounter (Signed)
°  Follow Up   Calling to follow up on clearance request. Please call.

## 2017-05-17 NOTE — Telephone Encounter (Signed)
Dr. Gwenlyn Found agreed for pt to hold Plavix for 5-7 days for procedure.  Mr. Jian DOB  02/02/2051.

## 2017-05-17 NOTE — Telephone Encounter (Signed)
SPOKE TO PT AND HAS BEEN CLEARED FOR  PROCEDURE  PT HASNT TOOK ANY PLAVIX TODAY PER [facility] HE SHOULD BE OKAY AND CONTINUE TO HOLD  FOR PROCEDURE.  CLEARANCE HAS BEEN FAXED TO [facility]  [phone]

## 2017-05-17 NOTE — Telephone Encounter (Signed)
LMOVM TO CALL  SURGERY CLEARANCE DEPT  BACK  FOR FURTHER INFORMATION ON CLEARANCE FOR PT

## 2017-05-17 NOTE — Telephone Encounter (Signed)
   [facility] Pre-operative Risk Assessment   *URGENT* Request for surgical clearance:  1. What type of surgery is being performed? colonoscopy   2. When is this surgery scheduled? 05/21/17   3. Are there any medications that need to be held prior to surgery and how long?Plavix   4. Practice name and name of physician performing surgery?  [facility] 5. Dr. Paulita Fujita   6. What is your office phone and fax number?  7. Phone: [phone]; Fax: [phone]   8. Anesthesia type (None, local, MAC, general) ? Not specified   Reginald Little 05/17/2017, 10:51 AM  _________________________________________________________________   (provider comments below)

## 2017-05-17 NOTE — Telephone Encounter (Signed)
Will check with Dr. Gwenlyn Found if ok to hold Plavix 5-7 days - Dr. Gwenlyn Found pt has been seen in ER with SOB neg for PE.  [place]  His colonoscopy is 05/21/17.  Thanks.

## 2017-05-22 NOTE — Telephone Encounter (Signed)
OK to interrupt anti-platelet Rx for colonoscopy

## 2017-05-23 NOTE — Telephone Encounter (Signed)
   Primary Cardiologist: Dr. Gwenlyn Found   Chart reviewed as part of pre-operative protocol coverage. Given past medical history and time since last visit, based on ACC/AHA guidelines, Reginald Little would be at acceptable risk for the planned procedure without further cardiovascular testing.   As outlined below, can hold Plavix 5 days prior to the procedure.   I will route this recommendation to the requesting party via Epic fax function and remove from pre-op pool.  Please call with questions.  Erma Heritage, PA-C 05/23/2017, 2:37 PM

## 2017-06-01 DIAGNOSIS — D126 Benign neoplasm of colon, unspecified: Secondary | ICD-10-CM | POA: Diagnosis not present

## 2017-06-01 DIAGNOSIS — K5730 Diverticulosis of large intestine without perforation or abscess without bleeding: Secondary | ICD-10-CM | POA: Diagnosis not present

## 2017-06-01 DIAGNOSIS — Z1211 Encounter for screening for malignant neoplasm of colon: Secondary | ICD-10-CM | POA: Diagnosis not present

## 2017-06-08 DIAGNOSIS — Z1211 Encounter for screening for malignant neoplasm of colon: Secondary | ICD-10-CM | POA: Diagnosis not present

## 2017-06-08 DIAGNOSIS — D126 Benign neoplasm of colon, unspecified: Secondary | ICD-10-CM | POA: Diagnosis not present

## 2017-06-21 DIAGNOSIS — H5213 Myopia, bilateral: Secondary | ICD-10-CM | POA: Diagnosis not present

## 2017-06-21 DIAGNOSIS — H2513 Age-related nuclear cataract, bilateral: Secondary | ICD-10-CM | POA: Diagnosis not present

## 2017-06-21 DIAGNOSIS — E119 Type 2 diabetes mellitus without complications: Secondary | ICD-10-CM | POA: Diagnosis not present

## 2017-06-29 ENCOUNTER — Other Ambulatory Visit: Payer: Self-pay | Admitting: Physician Assistant

## 2017-06-29 NOTE — Telephone Encounter (Signed)
Refill Request.  

## 2017-06-29 NOTE — Telephone Encounter (Signed)
REFILL 

## 2017-07-16 DIAGNOSIS — D23112 Other benign neoplasm of skin of right lower eyelid, including canthus: Secondary | ICD-10-CM | POA: Diagnosis not present

## 2017-07-16 DIAGNOSIS — L918 Other hypertrophic disorders of the skin: Secondary | ICD-10-CM | POA: Diagnosis not present

## 2017-07-23 DIAGNOSIS — Z7984 Long term (current) use of oral hypoglycemic drugs: Secondary | ICD-10-CM | POA: Diagnosis not present

## 2017-07-23 DIAGNOSIS — I700 Atherosclerosis of aorta: Secondary | ICD-10-CM | POA: Diagnosis not present

## 2017-07-23 DIAGNOSIS — I10 Essential (primary) hypertension: Secondary | ICD-10-CM | POA: Diagnosis not present

## 2017-07-23 DIAGNOSIS — E119 Type 2 diabetes mellitus without complications: Secondary | ICD-10-CM | POA: Diagnosis not present

## 2017-09-14 ENCOUNTER — Other Ambulatory Visit: Payer: Self-pay | Admitting: Cardiovascular Disease

## 2017-09-20 ENCOUNTER — Other Ambulatory Visit: Payer: Self-pay | Admitting: Cardiovascular Disease

## 2017-09-20 MED ORDER — ATENOLOL 50 MG PO TABS: 50.0000 mg | ORAL_TABLET | Freq: Every day | ORAL | 1 refills | Status: DC

## 2017-10-26 IMAGING — NM NM MISC PROCEDURE
6 series · 36 of 36 positions shown · non-contrast
Comparison: none

[Series 1: wbr rest · 6.40mm/px · 6 of 64 frames shown]
[frame 6/64]
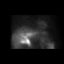
[frame 16/64]
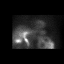
[frame 27/64]
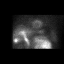
[frame 38/64]
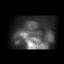
[frame 48/64]
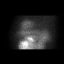
[frame 59/64]
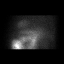

[Series 1: wbr_r-proj_st wbr rest · 6.40mm/px · 6 of 64 frames shown]
[frame 6/64]
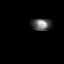
[frame 16/64]
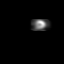
[frame 27/64]
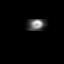
[frame 38/64]
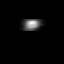
[frame 48/64]
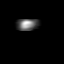
[frame 59/64]
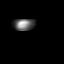

[Series 2: wbr stress-gsp · 6.40mm/px · 6 of 512 frames shown]
[frame 43/512]
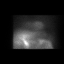
[frame 128/512]
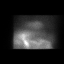
[frame 214/512]
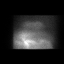
[frame 299/512]
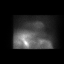
[frame 384/512]
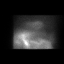
[frame 470/512]
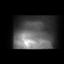

[Series 2: wbr_s-proj_st wbr stress-gsp · 6.40mm/px · 6 of 512 frames shown]
[frame 43/512]
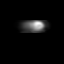
[frame 128/512]
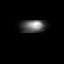
[frame 214/512]
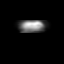
[frame 299/512]
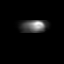
[frame 384/512]
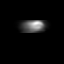
[frame 470/512]
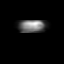

[Series 3: wbr stress-sum-em · 6.40mm/px · 6 of 64 frames shown]
[frame 6/64]
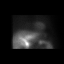
[frame 16/64]
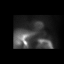
[frame 27/64]
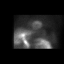
[frame 38/64]
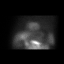
[frame 48/64]
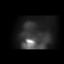
[frame 59/64]
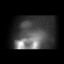

[Series 3: wbr_s-proj_st wbr stress-sum-em · 6.40mm/px · 6 of 64 frames shown]
[frame 6/64]
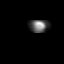
[frame 16/64]
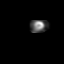
[frame 27/64]
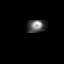
[frame 38/64]
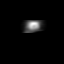
[frame 48/64]
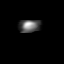
[frame 59/64]
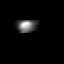

[36 of 36 positions shown; findings below may reference images not displayed]

Canned report from images found in remote index.

Refer to host system for actual result text.

## 2017-11-12 DIAGNOSIS — M25511 Pain in right shoulder: Secondary | ICD-10-CM | POA: Diagnosis not present

## 2017-11-12 DIAGNOSIS — H9201 Otalgia, right ear: Secondary | ICD-10-CM | POA: Diagnosis not present

## 2017-11-12 DIAGNOSIS — E1169 Type 2 diabetes mellitus with other specified complication: Secondary | ICD-10-CM | POA: Diagnosis not present

## 2017-11-12 DIAGNOSIS — I10 Essential (primary) hypertension: Secondary | ICD-10-CM | POA: Diagnosis not present

## 2017-11-14 ENCOUNTER — Other Ambulatory Visit: Payer: Self-pay | Admitting: Geriatric Medicine

## 2017-11-14 DIAGNOSIS — R911 Solitary pulmonary nodule: Secondary | ICD-10-CM

## 2017-11-30 DIAGNOSIS — M7501 Adhesive capsulitis of right shoulder: Secondary | ICD-10-CM | POA: Diagnosis not present

## 2017-12-17 ENCOUNTER — Ambulatory Visit
Admission: RE | Admit: 2017-12-17 | Discharge: 2017-12-17 | Disposition: A | Discharge status: Home / Self Care | Payer: Medicare HMO | Source: Ambulatory Visit | Attending: Geriatric Medicine | Admitting: Geriatric Medicine

## 2017-12-17 DIAGNOSIS — R911 Solitary pulmonary nodule: Secondary | ICD-10-CM

## 2017-12-25 ENCOUNTER — Telehealth: Payer: Self-pay | Admitting: *Deleted

## 2017-12-25 NOTE — Telephone Encounter (Signed)
   [facility] Pre-operative Risk Assessment    Request for surgical clearance:  1. What type of surgery is being performed?  COLONOSCOPY - 6 MONTH REPEAT DUE TO MULTIPLE PRECANCEROUS POLYPS    2. When is this surgery scheduled? 02-15-18   3. What type of clearance is required (medical clearance vs. Pharmacy clearance to hold med vs. Both)?    4. Are there any medications that need to be held prior to surgery and how long?PLAVIX  HOW LONG?   5. Practice name and name of physician performing surgery? [facility]   6. What is your office phone number? [phone]     7.   What is your office fax number?        [phone]  8.   Anesthesia type (None, local, MAC, general) ?

## 2017-12-26 NOTE — Telephone Encounter (Signed)
Spoke with patient, appointment made and patient voiced understanding.

## 2017-12-26 NOTE — Telephone Encounter (Signed)
   Primary Cardiologist: Dr Gwenlyn Found  Chart reviewed as part of pre-operative protocol coverage. Because of Demont Linford past medical history and time since last visit, he/she will require a follow-up visit in order to better assess preoperative cardiovascular risk.  Please make an appointment for pt to see dr Gwenlyn Found or Almyra Deforest in next 3-5 weeks for pre op clearance.   Pre-op covering staff: - Please schedule appointment and call patient to inform them. - Please contact requesting surgeon's office via preferred method (i.e, phone, fax) to inform them of need for appointment prior to surgery.  Kerin Ransom, PA-C  12/26/2017, 2:03 PM

## 2017-12-28 DIAGNOSIS — M7501 Adhesive capsulitis of right shoulder: Secondary | ICD-10-CM | POA: Diagnosis not present

## 2018-01-09 ENCOUNTER — Other Ambulatory Visit: Payer: Self-pay | Admitting: Cardiovascular Disease

## 2018-01-15 ENCOUNTER — Encounter: Payer: Self-pay | Admitting: Physician Assistant

## 2018-01-15 ENCOUNTER — Encounter: Payer: Self-pay | Admitting: *Deleted

## 2018-01-15 ENCOUNTER — Ambulatory Visit: Payer: Medicare HMO | Admitting: Physician Assistant

## 2018-01-15 VITALS — BP 132/72 | HR 78 | Ht 68.5 in | Wt 219.0 lb

## 2018-01-15 DIAGNOSIS — I2510 Atherosclerotic heart disease of native coronary artery without angina pectoris: Secondary | ICD-10-CM | POA: Diagnosis not present

## 2018-01-15 DIAGNOSIS — E785 Hyperlipidemia, unspecified: Secondary | ICD-10-CM

## 2018-01-15 DIAGNOSIS — R0789 Other chest pain: Secondary | ICD-10-CM | POA: Diagnosis not present

## 2018-01-15 DIAGNOSIS — I10 Essential (primary) hypertension: Secondary | ICD-10-CM

## 2018-01-15 DIAGNOSIS — I6523 Occlusion and stenosis of bilateral carotid arteries: Secondary | ICD-10-CM

## 2018-01-15 DIAGNOSIS — E119 Type 2 diabetes mellitus without complications: Secondary | ICD-10-CM

## 2018-01-15 MED ORDER — ISOSORBIDE MONONITRATE ER 60 MG PO TB24: 120.0000 mg | ORAL_TABLET | Freq: Every day | ORAL | 3 refills | Status: DC

## 2018-01-15 NOTE — Patient Instructions (Signed)
Medication Instructions: Your physician recommends that you continue on your current medications as directed. Please refer to the Current Medication list given to you today.   Testing/Procedures: Your physician has requested that you have an echocardiogram. Echocardiography is a painless test that uses sound waves to create images of your heart. It provides your doctor with information about the size and shape of your heart and how well your heart's chambers and valves are working. This procedure takes approximately one hour. There are no restrictions for this procedure.  Follow-Up: Your physician recommends that you schedule a follow-up appointment in: 4-6 months with Dr. Gwenlyn Found.   Any Other Special Instructions are listed below:  Increase activity. Please call if you experience exertional chest pain. Our number is [phone].   If you need a refill on your cardiac medications before your next appointment, please call your pharmacy.

## 2018-01-15 NOTE — Progress Notes (Signed)
Cardiology Office Note    Date:  01/15/2018   ID:  Attilio Zeitler, DOB 1950/10/24, MRN [id number]  PCP:  Lajean Manes, MD  Cardiologist:  Dr. Gwenlyn Found  Chief Complaint  Patient presents with  . Pre-op Exam    pending colonoscopy by Dr. Paulita Fujita  . Follow-up    seen for Dr. Gwenlyn Found.     History of Present Illness:  Reginald Little is a 67 y.o. male with PMH of CAD (DES x 2 to RCA in 2013, DES of OM and PLA branches 2015,LHC in 2016 w/patent stents and nonobs CAD), carotid artery disease w/ L-CEA in [place] 2010, diet controlled DM, HTN, HLD and GERD. Last echocardiogram obtained on 06/06/2014 showed EF 56-43%, grade 1 diastolic dysfunction. His Myoview obtained on 06/07/2014 showed EF 59%, mixed pattern of reversible or nonreversible decreased myocardial perfusion involving the inferior and inferolateral wall, moderate risk stress test finding. He ended up having 2.25 x 12 mm Promus DES placed in PLA 1and 2.5 x 12 mm Promus DES placed in OM 2 by Dr. Burt Knack on 06/08/2014. He returned in April 2016 for another cardiac catheterization which did not show significant culprit lesion to explain his symptoms.  Last Myoview in January 2018 came back low risk, EF 62%, fixed medium-sized mild basal in the mid inferior perfusion defect more likely to be attenuation artifact than infarction, overall no ischemia.  I evaluated the patient multiple times for recurrent atypical chest pain.  His Imdur was uptitrated.  I last saw the patient in July 2018, he was chest pain-free at the time.  He was seen by Dr. Gwenlyn Found in September 2018.  Carotid Doppler obtained on 03/08/2017 showed only 1 to 39% bilateral disease, 1 year repeat ultrasound was recommended.  Lower extremity ABI was also normal in October 2018.  Patient was seen in the ED the October 2018 twice for atypical chest pain.  CT angiogram of the chest was negative for PE.  Troponin was negative as well.  Patient presents today for cardiology office visit.  He is  thinking of delaying the colonoscopy until early next year.  He does have occasional chest discomfort, he described the recent symptom is a focal left-sided chest pain under the left breast.  It is worse with palpation and deep inspiration.  This only occurs at rest and is not noticeable during exertion.  He is planning to join the [facility] to increase activity and lose additional weight.  I will obtain an echocardiogram to assess his ejection fraction, given the atypical nature of his chest pain, if EF is normal, I would not recommend any further work-up.   Past Medical History:  Diagnosis Date  . Atypical chest pain   . Carotid artery disease ([place])    a. s/p L CEA  . Coronary artery disease    a. s/p DES x 2 to the RCA (05/2012 at [facility]) b. 05/2014 Abnl MV w/ inf/inflat partially rev defect;  c. s/p DES of OM2 and RPL1 (05/2014)  d. s/p LHC on 10/06/14 with patent stents and non-obs CAD; e. 06/2016 MV: low risk, EF 62%, fixed medium-sized, mild basal and inf defect - likely attenuation. No ischemia.  . Diabetes mellitus without complication ([place])   . GERD (gastroesophageal reflux disease)   . HLD (hyperlipidemia)   . Hypertension   . Obesity   . Syncope    a. 10/2014 - felt to be 2/2 hypotension after recent imdur titration.    Past Surgical History:  Procedure Laterality  Date  . CAROTID ENDARTERECTOMY    . CORONARY ANGIOPLASTY WITH STENT PLACEMENT    . LEFT HEART CATHETERIZATION WITH CORONARY ANGIOGRAM N/A 06/08/2014   Procedure: LEFT HEART CATHETERIZATION WITH CORONARY ANGIOGRAM;  Surgeon: Blane Ohara, MD;  Location: [facility] CATH LAB;  Service: Cardiovascular;  Laterality: N/A;  . LEFT HEART CATHETERIZATION WITH CORONARY ANGIOGRAM N/A 10/06/2014   Procedure: LEFT HEART CATHETERIZATION WITH CORONARY ANGIOGRAM;  Surgeon: Leonie Man, MD;  Location: [facility] CATH LAB;  Service: Cardiovascular;  Laterality: N/A;    Current Medications: Outpatient Medications Prior to Visit  Medication Sig  Dispense Refill  . acetaminophen (TYLENOL) 500 MG tablet Take 500 mg by mouth every 6 (six) hours as needed for mild pain or headache.    Marland Kitchen aspirin EC 81 MG tablet Take 81 mg by mouth daily.    Marland Kitchen atenolol (TENORMIN) 50 MG tablet Take 1 tablet (50 mg total) by mouth daily. 90 tablet 1  . Cholecalciferol (VITAMIN D3) 1000 units CAPS Take 1,000 Units by mouth daily.    . clopidogrel (PLAVIX) 75 MG tablet TAKE 1 TABLET EVERY DAY 90 tablet 0  . Coenzyme Q10 (CO Q-10) 300 MG CAPS Take 300 mg by mouth daily.    . furosemide (LASIX) 20 MG tablet TAKE 1 TABLET EVERY DAY 90 tablet 1  . moxifloxacin (VIGAMOX) 0.5 % ophthalmic solution Place 1 drop into both eyes daily as needed.    . Multiple Vitamins-Minerals (ONE-A-DAY MENS 50+ ADVANTAGE) TABS Take 1 tablet by mouth daily.    . nitroGLYCERIN (NITROSTAT) 0.4 MG SL tablet Place 1 tablet (0.4 mg total) every 5 (five) minutes x 3 doses as needed under the tongue for chest pain. 90 tablet 3  . pantoprazole (PROTONIX) 40 MG tablet TAKE 1 TABLET DAILY IF NEEDED 90 tablet 3  . rosuvastatin (CRESTOR) 20 MG tablet TAKE 1 TABLET EVERY DAY 90 tablet 0  . sitaGLIPtin (JANUVIA) 100 MG tablet Take 100 mg by mouth daily.    . isosorbide mononitrate (IMDUR) 120 MG 24 hr tablet Take 1 tablet (120 mg total) by mouth daily. 30 tablet 6  . isosorbide mononitrate (IMDUR) 60 MG 24 hr tablet Take 120 mg by mouth daily.    . isosorbide mononitrate (IMDUR) 60 MG 24 hr tablet TAKE 1 TABLET EVERY DAY (Patient not taking: Reported on 01/15/2018) 180 tablet 3   No facility-administered medications prior to visit.      Allergies:   Penicillins   Social History   Socioeconomic History  . Marital status: Divorced    Spouse name: Not on file  . Number of children: Not on file  . Years of education: Not on file  . Highest education level: Not on file  Occupational History  . Occupation: Owns transportation [place]  . Financial resource strain: Not on  file  . Food insecurity:    Worry: Not on file    Inability: Not on file  . Transportation needs:    Medical: Not on file    Non-medical: Not on file  Tobacco Use  . Smoking status: Former Smoker    Last attempt to quit: 06/05/2012    Years since quitting: 5.6  . Smokeless tobacco: Never Used  Substance and Sexual Activity  . Alcohol use: No  . Drug use: No  . Sexual activity: Not on file  Lifestyle  . Physical activity:    Days per week: Not on file    Minutes per session: Not on file  .  Stress: Not on file  Relationships  . Social connections:    Talks on phone: Not on file    Gets together: Not on file    Attends religious service: Not on file    Active member of club or organization: Not on file    Attends meetings of clubs or organizations: Not on file    Relationship status: Not on file  Other Topics Concern  . Not on file  Social History Narrative   Lives in [place] by himself.  Family and friends nearby.  He is a Psychologist, clinical.       Family History:  The patient's family history includes COPD (age of onset: 31) in his mother; Heart attack (age of onset: 64) in his father.   ROS:   Please see the history of present illness.    ROS All other systems reviewed and are negative.   PHYSICAL EXAM:   VS:  BP 132/72   Pulse 78   Ht 5' 8.5" (1.74 m)   Wt 219 lb (99.3 kg)   BMI 32.81 kg/m    GEN: Well nourished, well developed, in no acute distress  HEENT: normal  Neck: no JVD, carotid bruits, or masses Cardiac: RRR; no murmurs, rubs, or gallops,no edema  Respiratory:  clear to auscultation bilaterally, normal work of breathing GI: soft, nontender, nondistended, + BS MS: no deformity or atrophy  Skin: warm and dry, no rash Neuro:  Alert and Oriented x 3, Strength and sensation are intact Psych: euthymic mood, full affect  Wt Readings from Last 3 Encounters:  01/15/18 219 lb (99.3 kg)  04/12/17 210 lb (95.3 kg)  02/27/17 216 lb 3.2 oz (98.1 kg)      Studies/Labs  Reviewed:   EKG:  EKG is ordered today.  The ekg ordered today demonstrates normal sinus rhythm, nonspecific T wave changes.  No obvious T wave inversion in lead III, however not in adjacent lead.  Recent Labs: 05/11/2017: ALT 34; BUN 16; Creatinine, Ser 0.90; Hemoglobin 14.6; Platelets 249; Potassium 4.1; Sodium 141   Lipid Panel    Component Value Date/Time   CHOL 128 10/06/2014 0505   TRIG 96 10/06/2014 0505   HDL 29 (L) 10/06/2014 0505   CHOLHDL 4.4 10/06/2014 0505   VLDL 19 10/06/2014 0505   LDLCALC 80 10/06/2014 0505    Additional studies/ records that were reviewed today include:   Echo 05/27/2014 LV EF: 55% -  60%  - Left ventricle: The cavity size was normal. Wall thickness was increased in a pattern of mild LVH. Systolic function was normal. The estimated ejection fraction was in the range of 55% to 60%. Wall motion was normal; there were no regional wall motion abnormalities. Doppler parameters are consistent with abnormal left ventricular relaxation (grade 1 diastolic dysfunction). Doppler parameters are consistent with high ventricular filling pressure. - Aortic root: The aortic root was mildly dilated. - Mitral valve: Calcified annulus.  Impressions:  - Normal LV function; grade 1 diastolic dysfuntion; mild LVH; mildly dilated aortic root.    Myoview 05/28/2014 IMPRESSION: 1. Mixed pattern of reversible and non reversible decreased myocardial perfusion involving the inferior and inferolateral walls of the LEFT ventricle as above.  2. Dyskinetic appearing inferior wall LEFT ventricle.  3. Left ventricular ejection fraction 59%%  4. Moderate-risk stress test findings*.    Cath 06/08/2014 PCI Data: Lesion 1: Vessel - PLA 1 Percent Stenosis (pre) 90 TIMI-flow 3 Stent 2.25x12 mm Promus DES Percent Stenosis (post) 0 TIMI-flow (post) 3  Lesion 2: Vessel - OM2 Percent Stenosis (pre) 90 TIMI-flow 3 Stent 2.25x12 mm  Promus DES Percent Stenosis (post) 0 TIMI-flow (post) 3  Radiation dose/Fluoro time: 31.4 minutes  Estimated Blood Loss: minimal  Final Conclusions:  1. Two-vessel coronary artery disease with successful PCI of the obtuse marginal and PLA branches. 2. Continued patency of the RCA stents with mild in-stent restenosis 3. Nonobstructive LAD stenosis 4. Normal LV function by echo assessment  Recommendations:  Continue dual antiplatelet therapy with aspirin and Plavix for another 12 months.    Cath 10/06/2014 Coronary Anatomy:  Dominance: Right   Left Main: Large caliber, long mainstem with a actual septal perforator before trifurcating into the LAD, Ramus Intermedius and Circumflex. Angiographically normal.  ZOX:WRUEAV caliber vessel that is patent to the apex. There are mild diffuse irregularities with a roughly 40% focal lesion in the mid vessel. No high-grade lesions noted. There are 3 diagonal branches that are at least moderate caliber with minimal luminal irregularities.  Left Circumflex:Normal caliber, nondominant vessel with 2 obtuse marginal branches. First OM is patent and a second OM are widely patent proximal stent. The remainder of both OM vessels are free of disease. The small AV groove branch and terminates as a small posterolateral artery with no significant disease.   OM1:small-moderate caliber vessel; angiographic normal.   OM 2:small-moderate-caliber vessel with widely patent proximal stent.  . Ramus intermedius:moderate large-caliber vessel that bifurcates in the mid vessel into 2 small moderate caliber branches. Mild luminal irregularities    RCA: Large-caliber, dominant vessel with a high, anterior origin. The vessel is extremely tortuous with at least 3 major bends. It is heavily stented throughout the entire mid segment with mild in-stent stenosis of roughly 30-40%. The vessel bifurcates distally into the RPDA and the Right Posterior AV Groove  Branch (RPAV)  RPDA: Moderate caliber vessel. Angiographically normal.  RPL Sysytem:The RPAV begins as a small moderate caliber vessel it bifurcates into 2 posterolateral branches. There is a widely patent stent from the RPAV into RPL 1. No problems with flow in the jailed RPL 2. POST-OPERATIVE DIAGNOSIS:   2+ vessel disease with patent stents throughout the entire mid RCA and posterolateral branch of the RCA as well as OM 2.  Preserved LVEF with moderately elevated LVEDP despite having normal systolic pressures.  Mild (5-10 mm) gradient across aortic valve  No angiographically significant lesion to explain the patient's presentation. Consider possible microvascular ischemia with diastolic dysfunction.   Myoview 06/13/2016 Study Highlights     The left ventricular ejection fraction is normal (55-65%).  Nuclear stress EF: 62%.  There was no ST segment deviation noted during stress.  This is a low risk study.  1. EF 62%, normal wall motion.  2. Fixed medium-sized, mild basal to mid inferior perfusion defect. Given normal inferior wall motion, I suspect this is more likely attenuation than infarction. No ischemia.  3. Low risk study.      ASSESSMENT:    1. Atypical chest pain   2. Coronary artery disease involving native coronary artery of native heart without angina pectoris   3. Bilateral carotid artery stenosis   4. Essential hypertension   5. Hyperlipidemia, unspecified hyperlipidemia type   6. Controlled type 2 diabetes mellitus without complication, without long-term current use of insulin (HCC)      PLAN:  In order of problems listed above:  1. Atypical chest pain: Normal Myoview in January 2018, will obtain repeat echocardiogram, if EF is normal, would not recommend any further  work-up.  The chest pain is located under the left breast and it can occur for hours at a time.  There is no clear correlation with exertion and typically occurs at  rest.  2. CAD: Continue aspirin and Crestor 20 mg daily.  Negative Myoview in January 2018, however continue to have atypical chest discomfort.  3. Bilateral carotid artery disease: Mild disease noted on previous carotid ultrasound 03/08/2017, due for repeat carotid ultrasound this year  4. Hypertension: Blood pressure under control  5. Hyperlipidemia: Continue Crestor 20 mg daily.  Annual lipid panel followed by primary care provider  6. DM 2: Managed by primary care provider  7. Preoperative clearance: Initially this appointment was set up to clear him for colonoscopy, however patient wished to hold off until earlier next year.  I advised him to discuss with his GI physician regarding this to see if the colonoscopy is relatively urgent.  I am unable to see the previous colonoscopy report.  If the echocardiogram is normal, I think he should be able to proceed with colonoscopy after holding the Plavix for 5 to 7 days    Medication Adjustments/Labs and Tests Ordered: Current medicines are reviewed at length with the patient today.  Concerns regarding medicines are outlined above.  Medication changes, Labs and Tests ordered today are listed in the Patient Instructions below. Patient Instructions  Medication Instructions: Your physician recommends that you continue on your current medications as directed. Please refer to the Current Medication list given to you today.   Testing/Procedures: Your physician has requested that you have an echocardiogram. Echocardiography is a painless test that uses sound waves to create images of your heart. It provides your doctor with information about the size and shape of your heart and how well your heart's chambers and valves are working. This procedure takes approximately one hour. There are no restrictions for this procedure.  Follow-Up: Your physician recommends that you schedule a follow-up appointment in: 4-6 months with Dr. Gwenlyn Found.   Any Other  Special Instructions are listed below:  Increase activity. Please call if you experience exertional chest pain. Our number is [phone].   If you need a refill on your cardiac medications before your next appointment, please call your pharmacy.     Hilbert Corrigan, [place]  01/15/2018 5:18 PM    [facility] [address], [place], [place]  [address] Phone: [phone]; Fax: [phone]

## 2018-01-24 ENCOUNTER — Other Ambulatory Visit: Payer: Self-pay

## 2018-01-24 ENCOUNTER — Ambulatory Visit (HOSPITAL_COMMUNITY): Payer: Medicare HMO | Attending: Cardiology

## 2018-01-24 DIAGNOSIS — I10 Essential (primary) hypertension: Secondary | ICD-10-CM | POA: Insufficient documentation

## 2018-01-24 DIAGNOSIS — E785 Hyperlipidemia, unspecified: Secondary | ICD-10-CM | POA: Diagnosis not present

## 2018-01-24 DIAGNOSIS — I252 Old myocardial infarction: Secondary | ICD-10-CM | POA: Diagnosis not present

## 2018-01-24 DIAGNOSIS — Z6832 Body mass index (BMI) 32.0-32.9, adult: Secondary | ICD-10-CM | POA: Insufficient documentation

## 2018-01-24 DIAGNOSIS — I6529 Occlusion and stenosis of unspecified carotid artery: Secondary | ICD-10-CM | POA: Insufficient documentation

## 2018-01-24 DIAGNOSIS — E669 Obesity, unspecified: Secondary | ICD-10-CM | POA: Insufficient documentation

## 2018-01-24 DIAGNOSIS — I2510 Atherosclerotic heart disease of native coronary artery without angina pectoris: Secondary | ICD-10-CM | POA: Insufficient documentation

## 2018-02-18 DIAGNOSIS — M7501 Adhesive capsulitis of right shoulder: Secondary | ICD-10-CM | POA: Diagnosis not present

## 2018-03-01 DIAGNOSIS — Z23 Encounter for immunization: Secondary | ICD-10-CM | POA: Diagnosis not present

## 2018-03-08 ENCOUNTER — Ambulatory Visit (HOSPITAL_COMMUNITY)
Admission: RE | Admit: 2018-03-08 | Discharge: 2018-03-08 | Disposition: A | Discharge status: Home / Self Care | Payer: Medicare HMO | Source: Ambulatory Visit | Attending: Cardiology | Admitting: Cardiology

## 2018-03-08 ENCOUNTER — Ambulatory Visit (INDEPENDENT_AMBULATORY_CARE_PROVIDER_SITE_OTHER): Payer: Medicare HMO | Admitting: *Deleted

## 2018-03-08 VITALS — BP 132/80 | HR 87

## 2018-03-08 DIAGNOSIS — I779 Disorder of arteries and arterioles, unspecified: Secondary | ICD-10-CM

## 2018-03-08 DIAGNOSIS — I739 Peripheral vascular disease, unspecified: Secondary | ICD-10-CM

## 2018-03-08 DIAGNOSIS — I10 Essential (primary) hypertension: Secondary | ICD-10-CM

## 2018-03-08 NOTE — Progress Notes (Signed)
Patient came in via [name], PA from [place] message that his fingertips were blue, she asked that when patient came for his Carotid that he have a BP check, and o2 check. Patient BP was 132/80, HR 87, O2 96%, verified with Kerin Ransom PA, and advised patient to keep his upcoming appointments. Patient verbalized understanding.

## 2018-03-13 ENCOUNTER — Other Ambulatory Visit: Payer: Self-pay | Admitting: *Deleted

## 2018-03-13 DIAGNOSIS — I739 Peripheral vascular disease, unspecified: Principal | ICD-10-CM

## 2018-03-13 DIAGNOSIS — I779 Disorder of arteries and arterioles, unspecified: Secondary | ICD-10-CM

## 2018-03-19 DIAGNOSIS — I209 Angina pectoris, unspecified: Secondary | ICD-10-CM | POA: Diagnosis not present

## 2018-03-19 DIAGNOSIS — Z125 Encounter for screening for malignant neoplasm of prostate: Secondary | ICD-10-CM | POA: Diagnosis not present

## 2018-03-19 DIAGNOSIS — Z79899 Other long term (current) drug therapy: Secondary | ICD-10-CM | POA: Diagnosis not present

## 2018-03-19 DIAGNOSIS — E1169 Type 2 diabetes mellitus with other specified complication: Secondary | ICD-10-CM | POA: Diagnosis not present

## 2018-03-19 DIAGNOSIS — Z0000 Encounter for general adult medical examination without abnormal findings: Secondary | ICD-10-CM | POA: Diagnosis not present

## 2018-03-19 DIAGNOSIS — E7800 Pure hypercholesterolemia, unspecified: Secondary | ICD-10-CM | POA: Diagnosis not present

## 2018-03-19 DIAGNOSIS — Z1389 Encounter for screening for other disorder: Secondary | ICD-10-CM | POA: Diagnosis not present

## 2018-03-19 DIAGNOSIS — E1151 Type 2 diabetes mellitus with diabetic peripheral angiopathy without gangrene: Secondary | ICD-10-CM | POA: Diagnosis not present

## 2018-03-20 DIAGNOSIS — Z0000 Encounter for general adult medical examination without abnormal findings: Secondary | ICD-10-CM | POA: Diagnosis not present

## 2018-06-07 ENCOUNTER — Ambulatory Visit: Payer: Medicare HMO | Admitting: Cardiovascular Disease

## 2018-06-07 ENCOUNTER — Encounter: Payer: Self-pay | Admitting: Cardiovascular Disease

## 2018-06-07 DIAGNOSIS — I10 Essential (primary) hypertension: Secondary | ICD-10-CM | POA: Diagnosis not present

## 2018-06-07 DIAGNOSIS — I779 Disorder of arteries and arterioles, unspecified: Secondary | ICD-10-CM

## 2018-06-07 DIAGNOSIS — E785 Hyperlipidemia, unspecified: Secondary | ICD-10-CM | POA: Diagnosis not present

## 2018-06-07 DIAGNOSIS — I739 Peripheral vascular disease, unspecified: Secondary | ICD-10-CM | POA: Diagnosis not present

## 2018-06-07 DIAGNOSIS — I2510 Atherosclerotic heart disease of native coronary artery without angina pectoris: Secondary | ICD-10-CM

## 2018-06-07 DIAGNOSIS — Z9861 Coronary angioplasty status: Secondary | ICD-10-CM | POA: Diagnosis not present

## 2018-06-07 NOTE — Assessment & Plan Note (Addendum)
History of CAD status post stenting at [facility] in the past.  Cardiac catheterization and stenting using drug-eluting stents were performed by Dr. Burt Knack,  And he has had a negative Myoview stress test performed 06/13/2016.  He has joined the [employer] frequently and denies chest pain.

## 2018-06-07 NOTE — Assessment & Plan Note (Signed)
History of hyperlipidemia on statin therapy with lipid profile performed 03/19/2018 revealing total cholesterol 118, LDL 58 and HDL 34.

## 2018-06-07 NOTE — Assessment & Plan Note (Signed)
Treatment of essential hypertension her blood pressure measured today 112/60.  He is on atenolol.  Continue current meds at current dosing.

## 2018-06-07 NOTE — Patient Instructions (Signed)
Medication Instructions:  Your physician recommends that you continue on your current medications as directed. Please refer to the Current Medication list given to you today.  If you need a refill on your cardiac medications before your next appointment, please call your pharmacy.   Lab work: NONE If you have labs (blood work) drawn today and your tests are completely normal, you will receive your results only by: Marland Kitchen MyChart Message (if you have MyChart) OR . A paper copy in the mail If you have any lab test that is abnormal or we need to change your treatment, we will call you to review the results.  Testing/Procedures: NONE  Follow-Up: At [facility], you and your health needs are our priority.  As part of our continuing mission to provide you with exceptional heart care, we have created designated Provider Care Teams.  These Care Teams include your primary Cardiologist (physician) and Advanced Practice Providers (APPs -  Physician Assistants and Nurse Practitioners) who all work together to provide you with the care you need, when you need it. You will need a follow up appointment in 6 months with Almyra Deforest, PA and 12 months with Dr. Gwenlyn Found.  Please call our office 2 months in advance to schedule these appointments.

## 2018-06-07 NOTE — Assessment & Plan Note (Signed)
Carotid Doppler study performed 9/27 7/19 revealed revealed no evidence of ICA stenosis although there was a moderate left subclavian stenosis.  He has had a left carotid bypass operation.

## 2018-06-07 NOTE — Progress Notes (Signed)
06/07/2018 Reginald Little   December 03, 1950  [id number]  Primary Physician Lajean Manes, MD Primary Cardiologist: Lorretta Harp MD [name], [place]  HPI:  Reginald Little is a 67 y.o.  mildly overweight divorced Caucasian male with history of CAD status post stenting of his coronary arteries several years ago at [facility] using drug-eluting stents.I last saw him in the office  02/27/2017.His history is also remarkable for remote tobacco abuse, treated hypertension and hyperlipidemia. He had a left carotid endarterectomy performed at the [facility] in [place] number of years ago as well. He was admitted to the hospital in December with chest pain. A Myoview stress test was high risk and he underwent cardiac catheterization by Dr. Burt Knack stenting to coronary arteries using drug-eluting stents. He had some chest pain since which is improved with the addition of long-acting oral nitrate. He does complain of left hip claudication as well. He was admitted to the hospital for observation overnight 10/21/14 because of witnessed syncope while chronic rehabilitation. His nitrates were decreased in dose. He did have lower extremity artery Doppler studies which were normal ruling out peripheral vascular disease as a cause of his left hip pain. He was seen in emergency room and dilated by Dr. Berdine Addison T, and his pain quickly resolved with supplemental glycerin. He was seen by Almyra Deforest PAC in the office 01/15/2018 and was doing well.  A 2D echocardiogram recently performed 01/24/2018 was essentially normal as were carotid Dopplers other than a left subclavian artery stenosis.  The patient joined the [facility] and exercises frequently without limitation or symptoms.  Current Meds  Medication Sig  . acetaminophen (TYLENOL) 500 MG tablet Take 500 mg by mouth every 6 (six) hours as needed for mild pain or headache.  Marland Kitchen aspirin EC 81 MG tablet Take 81 mg by mouth daily.  Marland Kitchen atenolol (TENORMIN) 50 MG  tablet Take 1 tablet (50 mg total) by mouth daily.  . Cholecalciferol (VITAMIN D3) 1000 units CAPS Take 1,000 Units by mouth daily.  . clopidogrel (PLAVIX) 75 MG tablet TAKE 1 TABLET EVERY DAY  . Coenzyme Q10 (CO Q-10) 300 MG CAPS Take 300 mg by mouth daily.  . furosemide (LASIX) 20 MG tablet TAKE 1 TABLET EVERY DAY  . isosorbide mononitrate (IMDUR) 60 MG 24 hr tablet Take 2 tablets (120 mg total) by mouth daily.  Marland Kitchen moxifloxacin (VIGAMOX) 0.5 % ophthalmic solution Place 1 drop into both eyes daily as needed.  . nitroGLYCERIN (NITROSTAT) 0.4 MG SL tablet Place 1 tablet (0.4 mg total) every 5 (five) minutes x 3 doses as needed under the tongue for chest pain.  . pantoprazole (PROTONIX) 40 MG tablet TAKE 1 TABLET DAILY IF NEEDED  . rosuvastatin (CRESTOR) 20 MG tablet TAKE 1 TABLET EVERY DAY  . sitaGLIPtin (JANUVIA) 100 MG tablet Take 100 mg by mouth daily.     Allergies  Allergen Reactions  . Penicillins Rash    Has patient had a PCN reaction causing immediate rash, facial/tongue/throat swelling, SOB or lightheadedness with hypotension: Yes Has patient had a PCN reaction causing severe rash involving mucus membranes or skin necrosis: No Has patient had a PCN reaction that required hospitalization: No Has patient had a PCN reaction occurring within the last 10 years: No If all of the above answers are "NO", then may proceed with Cephalosporin use.     Social History   Socioeconomic History  . Marital status: Divorced    Spouse name: Not on  file  . Number of children: Not on file  . Years of education: Not on file  . Highest education level: Not on file  Occupational History  . Occupation: Owns transportation [employer]  . Financial resource strain: Not on file  . Food insecurity:    Worry: Not on file    Inability: Not on file  . Transportation needs:    Medical: Not on file    Non-medical: Not on file  Tobacco Use  . Smoking status: Former Smoker    Last  attempt to quit: 06/05/2012    Years since quitting: 6.0  . Smokeless tobacco: Never Used  Substance and Sexual Activity  . Alcohol use: No  . Drug use: No  . Sexual activity: Not on file  Lifestyle  . Physical activity:    Days per week: Not on file    Minutes per session: Not on file  . Stress: Not on file  Relationships  . Social connections:    Talks on phone: Not on file    Gets together: Not on file    Attends religious service: Not on file    Active member of club or organization: Not on file    Attends meetings of clubs or organizations: Not on file    Relationship status: Not on file  . Intimate partner violence:    Fear of current or ex partner: Not on file    Emotionally abused: Not on file    Physically abused: Not on file    Forced sexual activity: Not on file  Other Topics Concern  . Not on file  Social History Narrative   Lives in [place] by himself.  Family and friends nearby.  He is a Psychologist, clinical.       Review of Systems: General: negative for chills, fever, night sweats or weight changes.  Cardiovascular: negative for chest pain, dyspnea on exertion, edema, orthopnea, palpitations, paroxysmal nocturnal dyspnea or shortness of breath Dermatological: negative for rash Respiratory: negative for cough or wheezing Urologic: negative for hematuria Abdominal: negative for nausea, vomiting, diarrhea, bright red blood per rectum, melena, or hematemesis Neurologic: negative for visual changes, syncope, or dizziness All other systems reviewed and are otherwise negative except as noted above.    Blood pressure 112/60, pulse 79, height 5' 8.5" (1.74 m), weight 218 lb (98.9 kg).  General appearance: alert and no distress Neck: no adenopathy, no carotid bruit, no JVD, supple, symmetrical, trachea midline and thyroid not enlarged, symmetric, no tenderness/mass/nodules Lungs: clear to auscultation bilaterally Heart: regular rate and rhythm, S1, S2 normal, no murmur, click, rub or  gallop Extremities: extremities normal, atraumatic, no cyanosis or edema Pulses: 2+ and symmetric Skin: Skin color, texture, turgor normal. No rashes or lesions Neurologic: Alert and oriented X 3, normal strength and tone. Normal symmetric reflexes. Normal coordination and gait  EKG not performed today  ASSESSMENT AND PLAN:   Essential hypertension Treatment of essential hypertension her blood pressure measured today 112/60.  He is on atenolol.  Continue current meds at current dosing.  Hyperlipidemia History of hyperlipidemia on statin therapy with lipid profile performed 03/19/2018 revealing total cholesterol 118, LDL 58 and HDL 34.  Carotid artery disease ([place]) Carotid Doppler study performed 9/27 7/19 revealed revealed no evidence of ICA stenosis although there was a moderate left subclavian stenosis.  He has had a left carotid bypass operation.  CAD S/P percutaneous coronary angioplasty History of CAD status post stenting at [facility] in the  past.  Cardiac catheterization and stenting using drug-eluting stents were performed by Dr. Burt Knack,  And he has had a negative Myoview stress test performed 06/13/2016.  He has joined the [employer] exercises frequently and denies chest pain.      Lorretta Harp MD FACP,FACC,FAHA, [facility] 06/07/2018 11:14 AM

## 2018-06-13 DIAGNOSIS — H1789 Other corneal scars and opacities: Secondary | ICD-10-CM | POA: Diagnosis not present

## 2018-06-13 DIAGNOSIS — H524 Presbyopia: Secondary | ICD-10-CM | POA: Diagnosis not present

## 2018-06-13 DIAGNOSIS — E119 Type 2 diabetes mellitus without complications: Secondary | ICD-10-CM | POA: Diagnosis not present

## 2018-06-21 DIAGNOSIS — M65312 Trigger thumb, left thumb: Secondary | ICD-10-CM | POA: Diagnosis not present

## 2018-06-27 DIAGNOSIS — H1789 Other corneal scars and opacities: Secondary | ICD-10-CM | POA: Diagnosis not present

## 2018-07-18 DIAGNOSIS — H16011 Central corneal ulcer, right eye: Secondary | ICD-10-CM | POA: Diagnosis not present

## 2018-07-19 DIAGNOSIS — M7501 Adhesive capsulitis of right shoulder: Secondary | ICD-10-CM | POA: Diagnosis not present

## 2018-07-19 DIAGNOSIS — M65312 Trigger thumb, left thumb: Secondary | ICD-10-CM | POA: Diagnosis not present

## 2018-07-23 ENCOUNTER — Ambulatory Visit: Payer: Self-pay | Admitting: Physician Assistant

## 2018-07-23 DIAGNOSIS — E1165 Type 2 diabetes mellitus with hyperglycemia: Secondary | ICD-10-CM | POA: Diagnosis not present

## 2018-07-23 DIAGNOSIS — I209 Angina pectoris, unspecified: Secondary | ICD-10-CM | POA: Diagnosis not present

## 2018-07-23 DIAGNOSIS — Z79899 Other long term (current) drug therapy: Secondary | ICD-10-CM | POA: Diagnosis not present

## 2018-07-25 ENCOUNTER — Encounter: Payer: Self-pay | Admitting: Physician Assistant

## 2018-07-25 ENCOUNTER — Ambulatory Visit: Payer: Medicare HMO | Admitting: Physician Assistant

## 2018-07-25 VITALS — BP 120/72 | HR 71 | Ht 68.5 in | Wt 209.0 lb

## 2018-07-25 DIAGNOSIS — E119 Type 2 diabetes mellitus without complications: Secondary | ICD-10-CM | POA: Diagnosis not present

## 2018-07-25 DIAGNOSIS — I6523 Occlusion and stenosis of bilateral carotid arteries: Secondary | ICD-10-CM | POA: Diagnosis not present

## 2018-07-25 DIAGNOSIS — R0789 Other chest pain: Secondary | ICD-10-CM

## 2018-07-25 DIAGNOSIS — I10 Essential (primary) hypertension: Secondary | ICD-10-CM

## 2018-07-25 DIAGNOSIS — E785 Hyperlipidemia, unspecified: Secondary | ICD-10-CM

## 2018-07-25 DIAGNOSIS — I25119 Atherosclerotic heart disease of native coronary artery with unspecified angina pectoris: Secondary | ICD-10-CM

## 2018-07-25 DIAGNOSIS — H16011 Central corneal ulcer, right eye: Secondary | ICD-10-CM | POA: Diagnosis not present

## 2018-07-25 NOTE — Patient Instructions (Signed)
Medication Instructions:   Your physician recommends that you continue on your current medications as directed. Please refer to the Current Medication list given to you today.  If you need a refill on your cardiac medications before your next appointment, please call your pharmacy.   Lab work:  NONE   If you have labs (blood work) drawn today and your tests are completely normal, you will receive your results only by: Marland Kitchen MyChart Message (if you have MyChart) OR . A paper copy in the mail If you have any lab test that is abnormal or we need to change your treatment, we will call you to review the results.  Testing/Procedures:  NONE   Follow-Up: At [facility], you and your health needs are our priority.  As part of our continuing mission to provide you with exceptional heart care, we have created designated Provider Care Teams.  These Care Teams include your primary Cardiologist (physician) and Advanced Practice Providers (APPs -  Physician Assistants and Nurse Practitioners) who all work together to provide you with the care you need, when you need it. You will need a follow up appointment in 6 months (August 2020).  Please call our office 2 months (July 2020)  in advance to schedule this appointment.  You may see Quay Burow, MD or one of the following Advanced Practice Providers on your designated Care Team:   Kerin Ransom, PA-C Roby Lofts, [place] . Sande Rives, PA-C

## 2018-07-25 NOTE — Progress Notes (Signed)
Cardiology Office Note    Date:  07/25/2018   ID:  Reginald Little, DOB 15-Mar-1951, MRN [id number]  PCP:  Reginald Manes, MD  Cardiologist:  Dr. Gwenlyn Little  No chief complaint on file.   History of Present Illness:  Reginald Little is a 68 y.o. male with PMH of CAD (DES x 2 to RCA in 2013, DES of OM and PLA branches 2015,LHC in 2016 w/patent stents and nonobs CAD), carotid artery disease w/ L-CEA in [place] 2010, diet controlled DM, HTN, HLD and GERD. Last echocardiogram obtained on 06/06/2014 showed EF 44-01%, grade 1 diastolic dysfunction. His Myoview obtained on 06/07/2014 showed EF 59%, mixed pattern of reversible or nonreversible decreased myocardial perfusion involving the inferior and inferolateral wall, moderate risk stress test finding. He ended up having 2.25 x 12 mm Promus DES placed in PLA 1and 2.5 x 12 mm Promus DES placed in OM 2 by Dr. Burt Knack on 06/08/2014. He returnedin April 2016 for another cardiac catheterization which did not show significant culprit lesion to explain his symptoms.  Last Myoview in January 2018 came back low risk, EF 62%, fixed medium-sized mild basal and mid inferior perfusion defect more likely to be attenuation artifact than infarction, overall no ischemia.   Lower extremity arterial Doppler in October 2018 showed left ABI 1.01, right ABI 0.99.  Echocardiogram obtained on 01/24/2018 showed EF 35 to 60%, mildly dilated aortic root measuring 41 mm, mild LVH.  Carotid Doppler obtained on 03/08/2018 showed mild disease on the right, no significant plaque on the left side.  Patient presents today for cardiology office visit.  He says last weekend, he had an episode of chest pain that lasted only a few seconds.  He also noticed his blood pressure has been labile sometimes running high and other times running low.  Given the short duration of the chest pain, I recommend continue monitoring.  His blood pressure is actually quite stable today.  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 fib recently, I have reviewed his recent [employer] reading, A. fib was in fact artifact, he does not have any evidence of atrial fibrillation recently based on the strips he recorded.   Past Medical History:  Diagnosis Date  . Atypical chest pain   . Carotid artery disease ([name])    a. s/p L CEA  . Coronary artery disease    a. s/p DES x 2 to the RCA (05/2012 at [name]) b. 05/2014 Abnl MV w/ inf/inflat partially rev defect;  c. s/p DES of OM2 and RPL1 (05/2014)  d. s/p LHC on 10/06/14 with patent stents and non-obs CAD; e. 06/2016 MV: low risk, EF 62%, fixed medium-sized, mild basal and inf defect - likely attenuation. No ischemia.  . Diabetes mellitus without complication ([name])   . GERD (gastroesophageal reflux disease)   . HLD (hyperlipidemia)   . Hypertension   . Obesity   . Syncope    a. 10/2014 - felt to be 2/2 hypotension after recent imdur titration.    Past Surgical History:  Procedure Laterality Date  . CAROTID ENDARTERECTOMY    . CORONARY ANGIOPLASTY WITH STENT PLACEMENT    . LEFT HEART CATHETERIZATION WITH CORONARY ANGIOGRAM N/A 06/08/2014   Procedure: LEFT HEART CATHETERIZATION WITH CORONARY ANGIOGRAM;  Surgeon: Reginald Ohara, MD;  Location: [name];  Service: Cardiovascular;  Laterality: N/A;  . LEFT HEART CATHETERIZATION WITH CORONARY ANGIOGRAM N/A 10/06/2014   Procedure: LEFT HEART CATHETERIZATION WITH CORONARY  Reginald Little;  Surgeon: Reginald Man, MD;  Location: [name];  Service: Cardiovascular;  Laterality: N/A;    Current Medications: Outpatient Medications Prior to Visit  Medication Sig Dispense Refill  . aspirin EC 81 MG tablet Take 81 mg by mouth daily.    Marland Kitchen atenolol (TENORMIN) 50 MG tablet Take 1 tablet (50 mg total) by mouth daily. 90 tablet 1  . Cholecalciferol (VITAMIN D3) 1000 units CAPS Take 1,000 Units by mouth daily.    . clopidogrel (PLAVIX) 75 MG tablet TAKE 1 TABLET EVERY DAY 90 tablet 0  .  Coenzyme Q10 (CO Q-10) 300 MG CAPS Take 300 mg by mouth daily.    . furosemide (LASIX) 20 MG tablet TAKE 1 TABLET EVERY DAY 90 tablet 1  . isosorbide mononitrate (IMDUR) 60 MG 24 hr tablet Take 2 tablets (120 mg total) by mouth daily. 180 tablet 3  . JARDIANCE 10 MG TABS tablet Take 1 tablet by mouth daily.    . nitroGLYCERIN (NITROSTAT) 0.4 MG SL tablet Place 1 tablet (0.4 mg total) every 5 (five) minutes x 3 doses as needed under the tongue for chest pain. 90 tablet 3  . pantoprazole (PROTONIX) 40 MG tablet TAKE 1 TABLET DAILY IF NEEDED 90 tablet 3  . rosuvastatin (CRESTOR) 20 MG tablet TAKE 1 TABLET EVERY DAY 90 tablet 0  . acetaminophen (TYLENOL) 500 MG tablet Take 500 mg by mouth every 6 (six) hours as needed for mild pain or headache.    . moxifloxacin (VIGAMOX) 0.5 % ophthalmic solution Place 1 drop into both eyes daily as needed.    . sitaGLIPtin (JANUVIA) 100 MG tablet Take 100 mg by mouth daily.     No facility-administered medications prior to visit.      Allergies:   Penicillins   Social History   Socioeconomic History  . Marital status: Divorced    Spouse name: Not on file  . Number of children: Not on file  . Years of education: Not on file  . Highest education level: Not on file  Occupational History  . Occupation: Owns transportation [name]  . Financial resource strain: Not on file  . Food insecurity:    Worry: Not on file    Inability: Not on file  . Transportation needs:    Medical: Not on file    Non-medical: Not on file  Tobacco Use  . Smoking status: Former Smoker    Last attempt to quit: 06/05/2012    Years since quitting: 6.1  . Smokeless tobacco: Never Used  Substance and Sexual Activity  . Alcohol use: No  . Drug use: No  . Sexual activity: Not on file  Lifestyle  . Physical activity:    Days per week: Not on file    Minutes per session: Not on file  . Stress: Not on file  Relationships  . Social connections:     Talks on phone: Not on file    Gets together: Not on file    Attends religious service: Not on file    Active member of club or organization: Not on file    Attends meetings of clubs or organizations: Not on file    Relationship status: Not on file  Other Topics Concern  . Not on file  Social History Narrative   Lives in [place] by himself.  Family and friends nearby.  He is a Psychologist, clinical.       Family History:  The patient's family history includes  COPD (age of onset: 30) in his mother; Heart attack (age of onset: 80) in his father.   ROS:   Please see the history of present illness.    ROS All other systems reviewed and are negative.   PHYSICAL EXAM:   VS:  BP 120/72   Pulse 71   Ht 5' 8.5" (1.74 m)   Wt 209 lb (94.8 kg)   SpO2 98%   BMI 31.32 kg/m    GEN: Well nourished, well developed, in no acute distress  HEENT: normal  Neck: no JVD, carotid bruits, or masses Cardiac: RRR; no murmurs, rubs, or gallops,no edema  Respiratory:  clear to auscultation bilaterally, normal work of breathing GI: soft, nontender, nondistended, + BS MS: no deformity or atrophy  Skin: warm and dry, no rash Neuro:  Alert and Oriented x 3, Strength and sensation are intact Psych: euthymic mood, full affect  Wt Readings from Last 3 Encounters:  07/25/18 209 lb (94.8 kg)  06/07/18 218 lb (98.9 kg)  01/15/18 219 lb (99.3 kg)      Studies/Labs Reviewed:   EKG:  EKG is ordered today.  The ekg ordered today demonstrates normal sinus rhythm, occasional PVC  Recent Labs: No results Little for requested labs within last 8760 hours.   Lipid Panel    Component Value Date/Time   CHOL 128 10/06/2014 0505   TRIG 96 10/06/2014 0505   HDL 29 (L) 10/06/2014 0505   CHOLHDL 4.4 10/06/2014 0505   VLDL 19 10/06/2014 0505   LDLCALC 80 10/06/2014 0505    Additional studies/ records that were reviewed today include:   Echo 01/24/2018 LV EF: 55% -   60% Study Conclusions  - Left ventricle: The cavity size was  normal. Wall thickness was   increased in a pattern of mild LVH. There was mild focal basal   hypertrophy of the septum. Systolic function was normal. The   estimated ejection fraction was in the range of 55% to 60%. Wall   motion was normal; there were no regional wall motion   abnormalities. - Aortic valve: Trileaflet; mildly thickened, mildly calcified   leaflets. - Aorta: Aortic root dimension: 41 mm (ED). - Ascending aorta: The ascending aorta was mildly dilated. - Mitral valve: Calcified annulus. Mildly thickened leaflets    Carotid doppler 03/08/2018 Final Interpretation: Right Carotid: Velocities in the right ICA are consistent with a 1-39% stenosis.                Non-hemodynamically significant plaque <50% noted in the CCA. The                ECA appears >50% stenosed. The RICA velocities remain within                normal range and have decreased compared to the prior exam.  Left Carotid: Non-hemodynamically significant plaque noted in the CCA. The LICA               velocities remain within normal range and stable compared to the               prior exam, s/p bypass graft.  Vertebrals:  Bilateral vertebral arteries demonstrate antegrade flow. Subclavians: Left subclavian artery was stenotic. Left subclavian artery flow              was disturbed. Normal flow hemodynamics were seen in the right              subclavian artery.   ASSESSMENT:  1. Atypical chest pain   2. Essential hypertension   3. Coronary artery disease involving native coronary artery of native heart with angina pectoris ([place])   4. Bilateral carotid artery stenosis   5. Hyperlipidemia LDL goal <70   6. Controlled type 2 diabetes mellitus without complication, without long-term current use of insulin (HCC)      PLAN:  In order of problems listed above:  1. Atypical chest pain: Previously normal Myoview in 2018.  No further work-up at this time given the short duration of the chest  discomfort.  Continue to monitor  2. CAD: Continue aspirin and statin  3. Bilateral carotid artery disease: Repeat Doppler showed very mild disease.  Due for repeat Doppler in September 2020  4. Hypertension: Although he mentioned he has some labile blood pressure recently, his blood pressure is actually very well controlled today  5. Hyperlipidemia: Continue on Crestor.  We will request lipid panel from PCPs office  6. DM2: Managed by primary care provider    Medication Adjustments/Labs and Tests Ordered: Current medicines are reviewed at length with the patient today.  Concerns regarding medicines are outlined above.  Medication changes, Labs and Tests ordered today are listed in the Patient Instructions below. Patient Instructions  Medication Instructions:   Your physician recommends that you continue on your current medications as directed. Please refer to the Current Medication list given to you today.  If you need a refill on your cardiac medications before your next appointment, please call your pharmacy.   Lab work:  NONE   If you have labs (blood work) drawn today and your tests are completely normal, you will receive your results only by: Marland Kitchen MyChart Message (if you have MyChart) OR . A paper copy in the mail If you have any lab test that is abnormal or we need to change your treatment, we will call you to review the results.  Testing/Procedures:  NONE   Follow-Up: At [facility], you and your health needs are our priority.  As part of our continuing mission to provide you with exceptional heart care, we have created designated Provider Care Teams.  These Care Teams include your primary Cardiologist (physician) and Advanced Practice Providers (APPs -  Physician Assistants and Nurse Practitioners) who all work together to provide you with the care you need, when you need it. You will need a follow up appointment in 6 months (August 2020).  Please call our office 2  months (July 2020)  in advance to schedule this appointment.  You may see Quay Burow, MD or one of the following Advanced Practice Providers on your designated Care Team:   Kerin Ransom, PA-C Roby Lofts, [place] . Sande Rives, PA-C        Signed, [place], [place]  07/25/2018 3:11 PM    [facility] [address], [place], [place]  [address] Phone: [phone]; Fax: [phone]

## 2018-09-09 ENCOUNTER — Other Ambulatory Visit: Payer: Self-pay

## 2018-09-09 MED ORDER — ISOSORBIDE MONONITRATE ER 60 MG PO TB24: 120.0000 mg | ORAL_TABLET | Freq: Every day | ORAL | 3 refills | Status: DC

## 2018-12-28 ENCOUNTER — Other Ambulatory Visit: Payer: Self-pay | Admitting: Geriatric Medicine

## 2018-12-28 DIAGNOSIS — R911 Solitary pulmonary nodule: Secondary | ICD-10-CM

## 2018-12-31 ENCOUNTER — Other Ambulatory Visit: Payer: Self-pay | Admitting: Geriatric Medicine

## 2019-01-03 ENCOUNTER — Ambulatory Visit
Admission: RE | Admit: 2019-01-03 | Discharge: 2019-01-03 | Disposition: A | Discharge status: Home / Self Care | Payer: Medicare HMO | Source: Ambulatory Visit | Attending: Geriatric Medicine | Admitting: Geriatric Medicine

## 2019-01-03 DIAGNOSIS — R918 Other nonspecific abnormal finding of lung field: Secondary | ICD-10-CM | POA: Diagnosis not present

## 2019-01-03 DIAGNOSIS — R911 Solitary pulmonary nodule: Secondary | ICD-10-CM

## 2019-01-08 DIAGNOSIS — N401 Enlarged prostate with lower urinary tract symptoms: Secondary | ICD-10-CM | POA: Diagnosis not present

## 2019-01-08 DIAGNOSIS — E1169 Type 2 diabetes mellitus with other specified complication: Secondary | ICD-10-CM | POA: Diagnosis not present

## 2019-01-08 DIAGNOSIS — R350 Frequency of micturition: Secondary | ICD-10-CM | POA: Diagnosis not present

## 2019-01-08 DIAGNOSIS — E7800 Pure hypercholesterolemia, unspecified: Secondary | ICD-10-CM | POA: Diagnosis not present

## 2019-01-08 DIAGNOSIS — Z79899 Other long term (current) drug therapy: Secondary | ICD-10-CM | POA: Diagnosis not present

## 2019-01-08 DIAGNOSIS — E1151 Type 2 diabetes mellitus with diabetic peripheral angiopathy without gangrene: Secondary | ICD-10-CM | POA: Diagnosis not present

## 2019-01-27 DIAGNOSIS — R635 Abnormal weight gain: Secondary | ICD-10-CM | POA: Diagnosis not present

## 2019-02-06 DIAGNOSIS — E661 Drug-induced obesity: Secondary | ICD-10-CM | POA: Diagnosis not present

## 2019-02-06 DIAGNOSIS — I10 Essential (primary) hypertension: Secondary | ICD-10-CM | POA: Diagnosis not present

## 2019-02-06 DIAGNOSIS — E1165 Type 2 diabetes mellitus with hyperglycemia: Secondary | ICD-10-CM | POA: Diagnosis not present

## 2019-02-06 DIAGNOSIS — Z7984 Long term (current) use of oral hypoglycemic drugs: Secondary | ICD-10-CM | POA: Diagnosis not present

## 2019-02-06 DIAGNOSIS — Z6831 Body mass index (BMI) 31.0-31.9, adult: Secondary | ICD-10-CM | POA: Diagnosis not present

## 2019-02-09 ENCOUNTER — Encounter (HOSPITAL_COMMUNITY): Payer: Self-pay | Admitting: Emergency Medicine

## 2019-02-09 ENCOUNTER — Emergency Department (HOSPITAL_COMMUNITY)
Admission: EM | Admit: 2019-02-09 | Discharge: 2019-02-09 | Disposition: A | Discharge status: Home / Self Care | Payer: No Typology Code available for payment source | Attending: Emergency Medicine | Admitting: Emergency Medicine

## 2019-02-09 ENCOUNTER — Emergency Department (HOSPITAL_COMMUNITY): Payer: No Typology Code available for payment source

## 2019-02-09 ENCOUNTER — Other Ambulatory Visit: Payer: Self-pay

## 2019-02-09 DIAGNOSIS — Z87891 Personal history of nicotine dependence: Secondary | ICD-10-CM | POA: Diagnosis not present

## 2019-02-09 DIAGNOSIS — I10 Essential (primary) hypertension: Secondary | ICD-10-CM | POA: Insufficient documentation

## 2019-02-09 DIAGNOSIS — R0789 Other chest pain: Secondary | ICD-10-CM | POA: Insufficient documentation

## 2019-02-09 DIAGNOSIS — Z7982 Long term (current) use of aspirin: Secondary | ICD-10-CM | POA: Insufficient documentation

## 2019-02-09 DIAGNOSIS — R000 Tachycardia, unspecified: Secondary | ICD-10-CM | POA: Diagnosis not present

## 2019-02-09 DIAGNOSIS — Z20828 Contact with and (suspected) exposure to other viral communicable diseases: Secondary | ICD-10-CM | POA: Insufficient documentation

## 2019-02-09 DIAGNOSIS — Z7984 Long term (current) use of oral hypoglycemic drugs: Secondary | ICD-10-CM | POA: Diagnosis not present

## 2019-02-09 DIAGNOSIS — E119 Type 2 diabetes mellitus without complications: Secondary | ICD-10-CM | POA: Insufficient documentation

## 2019-02-09 DIAGNOSIS — Z7902 Long term (current) use of antithrombotics/antiplatelets: Secondary | ICD-10-CM | POA: Diagnosis not present

## 2019-02-09 DIAGNOSIS — I252 Old myocardial infarction: Secondary | ICD-10-CM | POA: Insufficient documentation

## 2019-02-09 DIAGNOSIS — I25119 Atherosclerotic heart disease of native coronary artery with unspecified angina pectoris: Secondary | ICD-10-CM | POA: Insufficient documentation

## 2019-02-09 DIAGNOSIS — Z79899 Other long term (current) drug therapy: Secondary | ICD-10-CM | POA: Insufficient documentation

## 2019-02-09 DIAGNOSIS — R110 Nausea: Secondary | ICD-10-CM | POA: Insufficient documentation

## 2019-02-09 DIAGNOSIS — R509 Fever, unspecified: Secondary | ICD-10-CM | POA: Diagnosis not present

## 2019-02-09 LAB — URINALYSIS, ROUTINE W REFLEX MICROSCOPIC
Bacteria, UA: NONE SEEN
Bilirubin Urine: NEGATIVE
Glucose, UA: >=500 mg/dL — AB
Hgb urine dipstick: NEGATIVE
Ketones, ur: 20 mg/dL — AB
Leukocytes,Ua: NEGATIVE
Nitrite: NEGATIVE
Protein, ur: NEGATIVE mg/dL
Specific Gravity, Urine: 1.022 (ref 1.005–1.030)
pH: 5 (ref 5.0–8.0)

## 2019-02-09 LAB — CBC WITH DIFFERENTIAL/PLATELET
Abs Immature Granulocytes: 0.15 10*3/uL — ABNORMAL HIGH (ref 0.00–0.07)
Basophils Absolute: 0.1 10*3/uL (ref 0.0–0.1)
Basophils Relative: 1 %
Eosinophils Absolute: 0 10*3/uL (ref 0.0–0.5)
Eosinophils Relative: 0 %
HCT: 46.4 % (ref 39.0–52.0)
Hemoglobin: 14.7 g/dL (ref 13.0–17.0)
Immature Granulocytes: 1 %
Lymphocytes Relative: 7 %
Lymphs Abs: 1.2 10*3/uL (ref 0.7–4.0)
MCH: 29.1 pg (ref 26.0–34.0)
MCHC: 31.7 g/dL (ref 30.0–36.0)
MCV: 91.7 fL (ref 80.0–100.0)
Monocytes Absolute: 1.3 10*3/uL — ABNORMAL HIGH (ref 0.1–1.0)
Monocytes Relative: 7 %
Neutro Abs: 15 10*3/uL — ABNORMAL HIGH (ref 1.7–7.7)
Neutrophils Relative %: 84 %
Platelets: 248 10*3/uL (ref 150–400)
RBC: 5.06 MIL/uL (ref 4.22–5.81)
RDW: 14.1 % (ref 11.5–15.5)
WBC: 17.7 10*3/uL — ABNORMAL HIGH (ref 4.0–10.5)
nRBC: 0 % (ref 0.0–0.2)

## 2019-02-09 LAB — APTT: aPTT: 31 seconds (ref 24–36)

## 2019-02-09 LAB — PROTIME-INR
INR: 1.1 (ref 0.8–1.2)
Prothrombin Time: 14.2 seconds (ref 11.4–15.2)

## 2019-02-09 LAB — LACTIC ACID, PLASMA
Lactic Acid, Venous: 2.8 mmol/L (ref 0.5–1.9)
Lactic Acid, Venous: 2.2 mmol/L (ref 0.5–1.9)

## 2019-02-09 LAB — COMPREHENSIVE METABOLIC PANEL
ALT: 28 U/L (ref 0–44)
AST: 22 U/L (ref 15–41)
Albumin: 3.9 g/dL (ref 3.5–5.0)
Alkaline Phosphatase: 57 U/L (ref 38–126)
Anion gap: 17 — ABNORMAL HIGH (ref 5–15)
BUN: 15 mg/dL (ref 8–23)
CO2: 23 mmol/L (ref 22–32)
Calcium: 9.7 mg/dL (ref 8.9–10.3)
Chloride: 99 mmol/L (ref 98–111)
Creatinine, Ser: 1.25 mg/dL — ABNORMAL HIGH (ref 0.61–1.24)
GFR calc Af Amer: >60 mL/min (ref >60)
GFR calc non Af Amer: 58 mL/min — ABNORMAL LOW (ref >60)
Glucose, Bld: 211 mg/dL — ABNORMAL HIGH (ref 70–99)
Potassium: 3.8 mmol/L (ref 3.5–5.1)
Sodium: 139 mmol/L (ref 135–145)
Total Bilirubin: 2.6 mg/dL — ABNORMAL HIGH (ref 0.3–1.2)
Total Protein: 7.5 g/dL (ref 6.5–8.1)

## 2019-02-09 LAB — SARS CORONAVIRUS 2 BY RT PCR (HOSPITAL ORDER, PERFORMED IN ~~LOC~~ HOSPITAL LAB): SARS Coronavirus 2: NEGATIVE

## 2019-02-09 LAB — LIPASE, BLOOD: Lipase: 20 U/L (ref 11–51)

## 2019-02-09 MED ORDER — VANCOMYCIN HCL 10 G IV SOLR
2000.0000 mg | Freq: Once | INTRAVENOUS | Status: AC
  Administered 2019-02-09: 2000 mg via INTRAVENOUS
  Filled 2019-02-09: qty 2000

## 2019-02-09 MED ORDER — DOXYCYCLINE HYCLATE 100 MG PO CAPS: 100.0000 mg | ORAL_CAPSULE | Freq: Two times a day (BID) | ORAL | 0 refills | Status: DC

## 2019-02-09 MED ORDER — METRONIDAZOLE IN NACL 5-0.79 MG/ML-% IV SOLN
500.0000 mg | Freq: Once | INTRAVENOUS | Status: AC
  Administered 2019-02-09: 500 mg via INTRAVENOUS
  Filled 2019-02-09: qty 100

## 2019-02-09 MED ORDER — ONDANSETRON HCL 4 MG/2ML IJ SOLN
4.0000 mg | Freq: Once | INTRAMUSCULAR | Status: AC
  Administered 2019-02-09: 16:00:00 4 mg via INTRAVENOUS
  Filled 2019-02-09: qty 2

## 2019-02-09 MED ORDER — VANCOMYCIN HCL 10 G IV SOLR: 1250.0000 mg | INTRAVENOUS | Status: DC

## 2019-02-09 MED ORDER — ONDANSETRON 4 MG PO TBDP: 4.0000 mg | ORAL_TABLET | Freq: Three times a day (TID) | ORAL | 0 refills | Status: DC | PRN

## 2019-02-09 MED ORDER — ACETAMINOPHEN 325 MG PO TABS
650.0000 mg | ORAL_TABLET | Freq: Once | ORAL | Status: AC
  Administered 2019-02-09: 650 mg via ORAL
  Filled 2019-02-09: qty 2

## 2019-02-09 MED ORDER — SODIUM CHLORIDE 0.9 % IV SOLN
2.0000 g | Freq: Three times a day (TID) | INTRAVENOUS | Status: DC
  Administered 2019-02-09: 2 g via INTRAVENOUS
  Filled 2019-02-09: qty 2

## 2019-02-09 MED ORDER — SODIUM CHLORIDE 0.9 % IV BOLUS
1000.0000 mL | Freq: Once | INTRAVENOUS | Status: AC
  Administered 2019-02-09: 1000 mL via INTRAVENOUS

## 2019-02-09 MED ORDER — ONDANSETRON HCL 4 MG/2ML IJ SOLN
4.0000 mg | Freq: Once | INTRAMUSCULAR | Status: AC
  Administered 2019-02-09: 13:00:00 4 mg via INTRAVENOUS
  Filled 2019-02-09: qty 2

## 2019-02-09 NOTE — Discharge Instructions (Addendum)
Your symptoms are likely consistent with a viral illness. Viruses do not require or respond to antibiotics. Treatment is symptomatic care and it is important to note that these symptoms may last for 7-14 days.   Hand washing: Wash your hands throughout the day, but especially before and after touching the face, using the restroom, sneezing, coughing, or touching surfaces that have been coughed or sneezed upon. Hydration: Symptoms of most illnesses will be intensified and complicated by dehydration. Dehydration can also extend the duration of symptoms. Drink plenty of fluids and get plenty of rest. You should be drinking at least half a liter of water an hour to stay hydrated. Electrolyte drinks (ex. Gatorade, Powerade, Pedialyte) are also encouraged. You should be drinking enough fluids to make your urine light yellow, almost clear. If this is not the case, you are not drinking enough water. Please note that some of the treatments indicated below will not be effective if you are not adequately hydrated. Diet: Please concentrate on hydration, however, you may introduce food slowly.  Start with a clear liquid diet, progressed to a full liquid diet, and then bland solids as you are able. Acetaminophen: May take acetaminophen (generic for Tylenol), as needed, for pain. Your daily total maximum amount of acetaminophen from all sources should be limited to 4000mg /day for persons without liver problems, or 2000mg /day for those with liver problems. Nausea/vomiting: Use the ondansetron (generic for Zofran) for nausea or vomiting.  This medication may not prevent all vomiting or nausea, but can help facilitate better hydration. Things that can help with nausea/vomiting also include peppermint/menthol candies, vitamin B12, and ginger. Zyrtec or Claritin: May add these medication daily to control underlying symptoms of congestion, sneezing, and other signs of allergies.  These medications are available  over-the-counter. Generics: Cetirizine (generic for Zyrtec) and loratadine (generic for Claritin). Fluticasone: Use fluticasone (generic for Flonase), as directed, for nasal and sinus congestion.  This medication is available over-the-counter. Congestion: Plain guaifenesin (generic for plain Mucinex) may help relieve congestion. Saline sinus rinses and saline nasal sprays may also help relieve congestion.  Sore throat: Warm liquids or Chloraseptic spray may help soothe a sore throat. Gargle twice a day with a salt water solution made from a half teaspoon of salt in a cup of warm water.  Follow up: Follow up with a primary care provider within the next two weeks should symptoms fail to resolve. Return: Return to the ED for significantly worsening symptoms, shortness of breath, persistent vomiting, large amounts of blood in stool, or any other major concerns.  For prescription assistance, may try using prescription discount sites or apps, such as goodrx.com

## 2019-02-09 NOTE — Progress Notes (Signed)
Pharmacy Antibiotic Note  Reginald Little is a 68 y.o. male admitted on 02/09/2019 with sepsis.  Pharmacy has been consulted for vancomycin/cefepime dosing. Tmax/24h 101.9, WBC 17.7, LA 2.8. SCr 1.25 on admit  Plan: Cefepime 2g IV x 1; then 2g IV q8h Vancomycin 2g IV x1; then Vancomycin 1250 mg IV Q 24 hrs. Goal AUC 400-550. Expected AUC: 536 SCr used: 1.25 Flagyl 500mg  IV x 1 per EDP - f/u if to continue Monitor clinical progress, c/s, renal function F/u de-escalation plan/LOT, vancomycin levels as indicated   Height: 5\' 8"  (172.7 cm) Weight: 208 lb 15.9 oz (94.8 kg) IBW/kg (Calculated) : 68.4  Temp (24hrs), Avg:100.9 F (38.3 C), Min:99.8 F (37.7 C), Max:101.9 F (38.8 C)  Recent Labs  Lab 02/09/19 1126 02/09/19 1140  WBC 17.7*  --   CREATININE 1.25*  --   LATICACIDVEN  --  2.8*    Estimated Creatinine Clearance: 62.3 mL/min (A) (by C-G formula based on SCr of 1.25 mg/dL (H)).    Allergies  Allergen Reactions  . Penicillins Rash    Has patient had a PCN reaction causing immediate rash, facial/tongue/throat swelling, SOB or lightheadedness with hypotension: Yes Has patient had a PCN reaction causing severe rash involving mucus membranes or skin necrosis: No Has patient had a PCN reaction that required hospitalization: No Has patient had a PCN reaction occurring within the last 10 years: No If all of the above answers are "NO", then may proceed with Cephalosporin use.     Antimicrobials this admission: 8/30 vancomycin >>  8/30 cefepime >>  8/30 flagyl x 1  Dose adjustments this admission:   Microbiology results:   Elicia Lamp, PharmD, BCPS Clinical Pharmacist 02/09/2019 12:27 PM

## 2019-02-09 NOTE — ED Triage Notes (Addendum)
Pt here from home, at 0100 today pt got up and took his temp which was 101 and checked his heart rate, which was 120. Pt endorses no symptoms before this morning. Endorses nausea without vomiting, no abdominal pain. A/O x 4. No recent surgeries. Pt felt tingling in L chest x 10 seconds this morning, thinks it is indigestion so he drank 16 oz water with relief. Denies chest pain on arrival.

## 2019-02-09 NOTE — ED Notes (Signed)
Pt asking for meds for acid reflux and water. Informed Shawn - PA.

## 2019-02-09 NOTE — ED Provider Notes (Signed)
[facility] EMERGENCY DEPARTMENT Provider Note   CSN: [id number] Arrival date & time: 02/09/19  1019     History   Chief Complaint Chief Complaint  Patient presents with   Fever   Tachycardia    HPI Epifanio Duchateau is a 68 y.o. male.     HPI  Shante Dente is a 68 y.o. male, with a history of GERD, DM, HTN, obesity, hyperlipidemia, presenting to the ED with fever beginning this morning.  He also noted his pulse to be around 120.  Accompanied by nausea. Patient states he awoke around 12:30 AM this morning feeling "generally uncomfortable."  He took his temperature orally and noted it to be 101.5 F. Sometime during the morning he had a 2-minute episode of chest discomfort, left chest, described as a tingling, nonradiating, 2/10.  This resolved after drinking ice water and did not recur.  Denies cough, shortness of breath, other chest pain, abdominal pain, dizziness, syncope, urinary symptoms, flank/back pain, wounds, or any other complaints.   Past Medical History:  Diagnosis Date   Atypical chest pain    Carotid artery disease ([place])    a. s/p L CEA   Coronary artery disease    a. s/p DES x 2 to the RCA (05/2012 at [facility]) b. 05/2014 Abnl MV w/ inf/inflat partially rev defect;  c. s/p DES of OM2 and RPL1 (05/2014)  d. s/p LHC on 10/06/14 with patent stents and non-obs CAD; e. 06/2016 MV: low risk, EF 62%, fixed medium-sized, mild basal and inf defect - likely attenuation. No ischemia.   Diabetes mellitus without complication (HCC)    GERD (gastroesophageal reflux disease)    HLD (hyperlipidemia)    Hypertension    Obesity    Syncope    a. 10/2014 - felt to be 2/2 hypotension after recent imdur titration.    Patient Active Problem List   Diagnosis Date Noted   GERD (gastroesophageal reflux disease)    Coronary artery disease    Syncope 10/21/2014   Dyspnea on exertion    CAD S/P percutaneous coronary angioplasty    Exertional angina (HCC)      Carotid artery disease ([place]) 08/18/2014   Claudication ([place]) 08/18/2014   Left hip pain 07/07/2014   Essential hypertension 07/07/2014   Obesity (BMI 30.0-34.9) 07/07/2014   Chest pain 06/06/2014   Hyperlipidemia 06/22/2012   Hypertension 06/22/2012   STEMI (ST elevation myocardial infarction) ([place]) 06/22/2012    Past Surgical History:  Procedure Laterality Date   CAROTID ENDARTERECTOMY     CORONARY ANGIOPLASTY WITH STENT PLACEMENT     LEFT HEART CATHETERIZATION WITH CORONARY ANGIOGRAM N/A 06/08/2014   Procedure: LEFT HEART CATHETERIZATION WITH CORONARY ANGIOGRAM;  Surgeon: Blane Ohara, MD;  Location[facility] C[facility] LAB;  Service: Cardiovascular;  Laterality: N/A;   LEFT HEART CATHETERIZATION WITH CORONARY ANGIOGRAM N/A 10/06/2014   Procedure: LEFT HEART CATHETERIZATION WITH CORONARY ANGIOGRAM;  Surgeon: Leonie Man, MD;  Location: [facility] CATH LAB;  Service: Cardiovascular;  Laterality: N/A;        Home Medications    Prior to Admission medications   Medication Sig Start Date End Date Taking? Authorizing Provider  aspirin EC 81 MG tablet Take 81 mg by mouth daily.   Yes [provider]  atenolol (TENORMIN) 50 MG tablet Take 1 tablet (50 mg total) by mouth daily. 09/20/17  Yes Lorretta Harp, MD  canagliflozin (INVOKANA) 300 MG TABS tablet Take 300 mg by mouth daily.   Yes [provider]  Cholecalciferol (VITAMIN D3) 1000 units CAPS Take 1,000 Units by mouth daily.   Yes [provider]  clopidogrel (PLAVIX) 75 MG tablet TAKE 1 TABLET EVERY DAY Patient taking differently: Take 75 mg by mouth daily.  01/09/18  Yes Lorretta Harp, MD  Coenzyme Q10 (CO Q-10) 300 MG CAPS Take 300 mg by mouth daily.   Yes [provider]  furosemide (LASIX) 20 MG tablet TAKE 1 TABLET EVERY DAY Patient taking differently: Take 20 mg by mouth daily.  09/14/17  Yes Lorretta Harp, MD  isosorbide mononitrate (IMDUR) 60 MG 24 hr tablet Take 2 tablets  (120 mg total) by mouth daily. Patient taking differently: Take 120 mg by mouth at bedtime.  09/09/18  Yes Lorretta Harp, MD  metFORMIN (GLUCOPHAGE-XR) 500 MG 24 hr tablet Take 500 mg by mouth at bedtime. 01/04/19  Yes [provider]  nitroGLYCERIN (NITROSTAT) 0.4 MG SL tablet Place 1 tablet (0.4 mg total) every 5 (five) minutes x 3 doses as needed under the tongue for chest pain. 04/23/17  Yes Lorretta Harp, MD  pantoprazole (PROTONIX) 40 MG tablet TAKE 1 TABLET DAILY IF NEEDED Patient taking differently: Take 40 mg by mouth daily.  06/29/17  Yes Almyra Deforest, PA  rosuvastatin (CRESTOR) 20 MG tablet TAKE 1 TABLET EVERY DAY Patient taking differently: Take 20 mg by mouth at bedtime.  01/09/18  Yes Lorretta Harp, MD  tamsulosin (FLOMAX) 0.4 MG CAPS capsule Take 0.4 mg by mouth at bedtime. 01/08/19  Yes [provider]  doxycycline (VIBRAMYCIN) 100 MG capsule Take 1 capsule (100 mg total) by mouth 2 (two) times daily. 02/09/19   Janne Faulk C, PA-C  ondansetron (ZOFRAN ODT) 4 MG disintegrating tablet Take 1 tablet (4 mg total) by mouth every 8 (eight) hours as needed for nausea or vomiting. 02/09/19   Chrishelle Zito, Helane Gunther, PA-C    Family History Family History  Problem Relation Age of Onset   Heart attack Father 1   COPD Mother 42    Social History Social History   Tobacco Use   Smoking status: Former Smoker    Quit date: 06/05/2012    Years since quitting: 6.6   Smokeless tobacco: Never Used  Substance Use Topics   Alcohol use: No   Drug use: No     Allergies   Penicillins   Review of Systems Review of Systems  Constitutional: Positive for fever.  HENT: Negative for sore throat.   Respiratory: Negative for cough and shortness of breath.   Cardiovascular: Negative for leg swelling.  Gastrointestinal: Positive for nausea. Negative for abdominal pain, blood in stool, diarrhea and vomiting.  Genitourinary: Negative for dysuria, flank pain and hematuria.    Musculoskeletal: Negative for back pain.  Neurological: Negative for dizziness, syncope, weakness and light-headedness.  All other systems reviewed and are negative.    Physical Exam Updated Vital Signs BP 129/76 (BP Location: Right Arm)    Pulse (!) 103    Temp (!) 101.9 F (38.8 C) (Rectal)    Resp 13    SpO2 95%   Physical Exam Vitals signs and nursing note reviewed.  Constitutional:      General: He is not in acute distress.    Appearance: He is well-developed. He is not diaphoretic.  HENT:     Head: Normocephalic and atraumatic.     Mouth/Throat:     Mouth: Mucous membranes are moist.     Pharynx: Oropharynx is clear.  Eyes:  Conjunctiva/sclera: Conjunctivae normal.  Neck:     Musculoskeletal: Neck supple.  Cardiovascular:     Rate and Rhythm: Regular rhythm. Tachycardia present.     Pulses: Normal pulses.          Radial pulses are 2+ on the right side and 2+ on the left side.       Posterior tibial pulses are 2+ on the right side and 2+ on the left side.     Heart sounds: Normal heart sounds.     Comments: Tactile temperature in the extremities appropriate and equal bilaterally. Pulmonary:     Effort: Pulmonary effort is normal. No respiratory distress.     Breath sounds: Normal breath sounds.  Abdominal:     Palpations: Abdomen is soft.     Tenderness: There is no abdominal tenderness. There is no guarding.  Musculoskeletal:     Right lower leg: No edema.     Left lower leg: No edema.  Lymphadenopathy:     Cervical: No cervical adenopathy.  Skin:    General: Skin is warm and dry.  Neurological:     Mental Status: He is alert.  Psychiatric:        Mood and Affect: Mood and affect normal.        Speech: Speech normal.        Behavior: Behavior normal.      ED Treatments / Results  Labs (all labs ordered are listed, but only abnormal results are displayed) Labs Reviewed  COMPREHENSIVE METABOLIC PANEL - Abnormal; Notable for the following  components:      Result Value   Glucose, Bld 211 (*)    Creatinine, Ser 1.25 (*)    Total Bilirubin 2.6 (*)    GFR calc non Af Amer 58 (*)    Anion gap 17 (*)    All other components within normal limits  CBC WITH DIFFERENTIAL/PLATELET - Abnormal; Notable for the following components:   WBC 17.7 (*)    Neutro Abs 15.0 (*)    Monocytes Absolute 1.3 (*)    Abs Immature Granulocytes 0.15 (*)    All other components within normal limits  URINALYSIS, ROUTINE W REFLEX MICROSCOPIC - Abnormal; Notable for the following components:   APPearance CLOUDY (*)    Glucose, UA >=500 (*)    Ketones, ur 20 (*)    All other components within normal limits  LACTIC ACID, PLASMA - Abnormal; Notable for the following components:   Lactic Acid, Venous 2.8 (*)    All other components within normal limits  LACTIC ACID, PLASMA - Abnormal; Notable for the following components:   Lactic Acid, Venous 2.2 (*)    All other components within normal limits  SARS CORONAVIRUS 2 (HOSPITAL ORDER, [facility])  CULTURE, BLOOD (ROUTINE X 2)  CULTURE, BLOOD (ROUTINE X 2)  URINE CULTURE  LIPASE, BLOOD  APTT  PROTIME-INR    EKG EKG Interpretation  Date/Time:  Sunday February 09 2019 10:49:16 EDT Ventricular Rate:  105 PR Interval:    QRS Duration: 98 QT Interval:  350 QTC Calculation: 463 R Axis:   43 Text Interpretation:  Sinus tachycardia Abnormal inferior Q waves No significant change since last tracing Confirmed by Deno Etienne [phone]) on 02/09/2019 11:17:43 AM   Radiology Dg Chest Portable 1 View  Result Date: 02/09/2019 CLINICAL DATA:  Fever EXAM: PORTABLE CHEST 1 VIEW COMPARISON:  05/12/2017 chest radiograph. FINDINGS: Stable cardiomediastinal silhouette with normal heart size. No pneumothorax. No pleural effusion.  Mild scarring versus atelectasis at the left lung base. No pulmonary edema. No acute consolidative airspace disease. Surgical clips in the left neck. IMPRESSION: Mild  left basilar scarring versus atelectasis. Otherwise no active disease. Electronically Signed   By: Ilona Sorrel M.D.   On: 02/09/2019 11:24    Procedures Procedures (including critical care time)  Medications Ordered in ED Medications  ceFEPIme (MAXIPIME) 2 g in sodium chloride 0.9 % 100 mL IVPB (0 g Intravenous Stopped 02/09/19 1524)  vancomycin (VANCOCIN) 2,000 mg in sodium chloride 0.9 % 500 mL IVPB (2,000 mg Intravenous New Bag/Given 02/09/19 1533)  vancomycin (VANCOCIN) 1,250 mg in sodium chloride 0.9 % 250 mL IVPB (has no administration in time range)  acetaminophen (TYLENOL) tablet 650 mg (650 mg Oral Given 02/09/19 1143)  metroNIDAZOLE (FLAGYL) IVPB 500 mg (0 mg Intravenous Stopped 02/09/19 1400)  sodium chloride 0.9 % bolus 1,000 mL (1,000 mLs Intravenous New Bag/Given 02/09/19 1256)  ondansetron (ZOFRAN) injection 4 mg (4 mg Intravenous Given 02/09/19 1253)  ondansetron (ZOFRAN) injection 4 mg (4 mg Intravenous Given 02/09/19 1537)     Initial Impression / Assessment and Plan / ED Course  I have reviewed the triage vital signs and the nursing notes.  Pertinent labs & imaging results that were available during my care of the patient were reviewed by me and considered in my medical decision making (see chart for details).  Clinical Course as of Feb 08 1621  Sun Feb 09, 2019  1238 Previously noted to be 1.8. Patient has no abdominal pain or abdominal tenderness.  Total Bilirubin(!): 2.6 [SJ]    Clinical Course User Index [SJ] Eamon Tantillo C, PA-C       Patient presents with complaint of fever. Patient is nontoxic appearing, not tachypneic, not hypotensive, maintains excellent SPO2 on room air, and is in no apparent distress.  Patient also has no other persistent complaints. He was noted to be febrile and mildly tachycardic.  Tachycardia resolved once fever was managed. Leukocytosis noted.  He had a mildly elevated lactic acid, which responded well to IV fluids. Due to initial  vital sign abnormalities, leukocytosis, and elevated lactic acid, a code sepsis was activated.  However, during the course of patient's time in the ED, it was noted that he continued to be symptom-free with no change in status. No acute abnormality on chest x-ray.  Very mildly elevated creatinine and mildly elevated anion gap.  Suspect some mild dehydration.  Shared decision-making conversation was had with the patient regarding admission versus discharge.  We discussed the pros and cons of each choice.  We ultimately decided on discharge on an antibiotic with close PCP follow-up.  The patient was given instructions for home care as well as return precautions. Patient voices understanding of these instructions, accepts the plan, and is comfortable with discharge.   Findings and plan of care discussed with Deno Etienne, DO. Dr. Tyrone Nine personally evaluated and examined this patient.  Vitals:   02/09/19 1530 02/09/19 1545 02/09/19 1600 02/09/19 1615  BP: (!) 142/58 (!) 111/49 109/88 (!) 132/56  Pulse: 94 93 90 94  Resp: 19 (!) 23 13 20   Temp:      TempSrc:      SpO2: 95% 95% 91% (!) 87%  Weight:      Height:         Final Clinical Impressions(s) / ED Diagnoses   Final diagnoses:  Febrile illness    ED Discharge Orders  Ordered    doxycycline (VIBRAMYCIN) 100 MG capsule  2 times daily     02/09/19 1533    ondansetron (ZOFRAN ODT) 4 MG disintegrating tablet  Every 8 hours PRN     02/09/19 1533           Jahmad Petrich, Helane Gunther, PA-C 02/09/19 [place], Dan, DO 02/10/19 1106

## 2019-02-11 LAB — URINE CULTURE: Culture: >=100000 — AB

## 2019-02-12 ENCOUNTER — Telehealth: Payer: Self-pay | Admitting: Emergency Medicine

## 2019-02-12 NOTE — Telephone Encounter (Signed)
Post ED Visit - Positive Culture Follow-up  Culture report reviewed by antimicrobial stewardship pharmacist: [place] Team []  Elenor Quinones, Pharm.D. [x]  Heide Guile, Pharm.D., BCPS AQ-ID []  Parks Neptune, Pharm.D., BCPS []  Alycia Rossetti, Pharm.D., BCPS []  Lolita, Pharm.D., BCPS, AAHIVP []  Legrand Como, Pharm.D., BCPS, AAHIVP []  Salome Arnt, PharmD, BCPS []  Johnnette Gourd, PharmD, BCPS []  Hughes Better, PharmD, BCPS []  Leeroy Cha, PharmD []  Laqueta Linden, PharmD, BCPS []  Albertina Parr, PharmD  [place] Team []  Leodis Sias, PharmD []  Lindell Spar, PharmD []  Royetta Asal, PharmD []  Graylin Shiver, Rph []  Rema Fendt) Glennon Mac, PharmD []  Arlyn Dunning, PharmD []  Netta Cedars, PharmD []  Dia Sitter, PharmD []  Leone Haven, PharmD []  Gretta Arab, PharmD []  Theodis Shove, PharmD []  Peggyann Juba, PharmD []  Reuel Boom, PharmD   Positive urine culture Treated with doxycycline, organism sensitive to the same and no further patient follow-up is required at this time.  Hazle Nordmann 02/12/2019, 10:58 AM

## 2019-02-14 LAB — CULTURE, BLOOD (ROUTINE X 2)
Culture: NO GROWTH
Culture: NO GROWTH

## 2019-02-26 DIAGNOSIS — E1169 Type 2 diabetes mellitus with other specified complication: Secondary | ICD-10-CM | POA: Diagnosis not present

## 2019-02-26 DIAGNOSIS — R110 Nausea: Secondary | ICD-10-CM | POA: Diagnosis not present

## 2019-02-26 DIAGNOSIS — R000 Tachycardia, unspecified: Secondary | ICD-10-CM | POA: Diagnosis not present

## 2019-02-26 DIAGNOSIS — R509 Fever, unspecified: Secondary | ICD-10-CM | POA: Diagnosis not present

## 2019-03-10 ENCOUNTER — Ambulatory Visit (HOSPITAL_COMMUNITY): Payer: Medicare HMO

## 2019-03-24 ENCOUNTER — Other Ambulatory Visit (HOSPITAL_COMMUNITY): Payer: Self-pay | Admitting: Cardiovascular Disease

## 2019-03-24 DIAGNOSIS — I6523 Occlusion and stenosis of bilateral carotid arteries: Secondary | ICD-10-CM

## 2019-03-25 DIAGNOSIS — I700 Atherosclerosis of aorta: Secondary | ICD-10-CM | POA: Diagnosis not present

## 2019-03-25 DIAGNOSIS — Z79899 Other long term (current) drug therapy: Secondary | ICD-10-CM | POA: Diagnosis not present

## 2019-03-25 DIAGNOSIS — E7800 Pure hypercholesterolemia, unspecified: Secondary | ICD-10-CM | POA: Diagnosis not present

## 2019-03-25 DIAGNOSIS — Z0000 Encounter for general adult medical examination without abnormal findings: Secondary | ICD-10-CM | POA: Diagnosis not present

## 2019-03-25 DIAGNOSIS — Z23 Encounter for immunization: Secondary | ICD-10-CM | POA: Diagnosis not present

## 2019-03-25 DIAGNOSIS — Z1389 Encounter for screening for other disorder: Secondary | ICD-10-CM | POA: Diagnosis not present

## 2019-03-25 DIAGNOSIS — D649 Anemia, unspecified: Secondary | ICD-10-CM | POA: Diagnosis not present

## 2019-03-25 DIAGNOSIS — E1169 Type 2 diabetes mellitus with other specified complication: Secondary | ICD-10-CM | POA: Diagnosis not present

## 2019-04-10 ENCOUNTER — Ambulatory Visit (HOSPITAL_COMMUNITY)
Admission: RE | Admit: 2019-04-10 | Discharge: 2019-04-10 | Disposition: A | Discharge status: Home / Self Care | Payer: Medicare HMO | Source: Ambulatory Visit | Attending: Cardiology | Admitting: Cardiology

## 2019-04-10 ENCOUNTER — Other Ambulatory Visit: Payer: Self-pay

## 2019-04-10 ENCOUNTER — Other Ambulatory Visit (HOSPITAL_COMMUNITY): Payer: Self-pay | Admitting: Cardiovascular Disease

## 2019-04-10 DIAGNOSIS — I6523 Occlusion and stenosis of bilateral carotid arteries: Secondary | ICD-10-CM

## 2019-04-15 ENCOUNTER — Other Ambulatory Visit: Payer: Self-pay | Admitting: *Deleted

## 2019-04-15 ENCOUNTER — Encounter: Payer: Self-pay | Admitting: *Deleted

## 2019-04-15 DIAGNOSIS — I6523 Occlusion and stenosis of bilateral carotid arteries: Secondary | ICD-10-CM

## 2019-04-25 DIAGNOSIS — Z7901 Long term (current) use of anticoagulants: Secondary | ICD-10-CM | POA: Diagnosis not present

## 2019-04-25 DIAGNOSIS — K635 Polyp of colon: Secondary | ICD-10-CM | POA: Diagnosis not present

## 2019-04-25 DIAGNOSIS — D649 Anemia, unspecified: Secondary | ICD-10-CM | POA: Diagnosis not present

## 2019-05-23 ENCOUNTER — Other Ambulatory Visit: Payer: Self-pay | Admitting: Cardiovascular Disease

## 2019-05-26 ENCOUNTER — Other Ambulatory Visit: Payer: Self-pay | Admitting: Cardiovascular Disease

## 2019-05-26 NOTE — Telephone Encounter (Signed)
*  STAT* If patient is at the pharmacy, call can be transferred to refill team.   1. Which medications need to be refilled? (please list name of each medication and dose if known)   isosorbide mononitrate (IMDUR) 60 MG 24 hr tablet   2. Which pharmacy/location (including street and city if local pharmacy) is medication to be sent to? [place], [place]  [phone]  3. Do they need a 30 day or 90 day supply? 90 Day    Patient states [employer] reached out to him stating they could not get in contact with [place] regarding his refill. They asked him to reach out and have a refill sent over & would like a call from our office at [phone].

## 2019-05-27 NOTE — Telephone Encounter (Signed)
Rx has been sent to the pharmacy electronically. ° °

## 2019-05-29 MED ORDER — ISOSORBIDE MONONITRATE ER 60 MG PO TB24: 120.0000 mg | ORAL_TABLET | Freq: Every day | ORAL | 0 refills | Status: DC

## 2019-06-02 DIAGNOSIS — R350 Frequency of micturition: Secondary | ICD-10-CM | POA: Diagnosis not present

## 2019-06-25 DIAGNOSIS — E7800 Pure hypercholesterolemia, unspecified: Secondary | ICD-10-CM | POA: Diagnosis not present

## 2019-06-25 DIAGNOSIS — E1151 Type 2 diabetes mellitus with diabetic peripheral angiopathy without gangrene: Secondary | ICD-10-CM | POA: Diagnosis not present

## 2019-06-25 DIAGNOSIS — Z0000 Encounter for general adult medical examination without abnormal findings: Secondary | ICD-10-CM | POA: Diagnosis not present

## 2019-06-25 DIAGNOSIS — Z79899 Other long term (current) drug therapy: Secondary | ICD-10-CM | POA: Diagnosis not present

## 2019-06-25 DIAGNOSIS — I700 Atherosclerosis of aorta: Secondary | ICD-10-CM | POA: Diagnosis not present

## 2019-06-25 DIAGNOSIS — E1169 Type 2 diabetes mellitus with other specified complication: Secondary | ICD-10-CM | POA: Diagnosis not present

## 2019-07-07 DIAGNOSIS — N401 Enlarged prostate with lower urinary tract symptoms: Secondary | ICD-10-CM | POA: Diagnosis not present

## 2019-07-07 DIAGNOSIS — E7800 Pure hypercholesterolemia, unspecified: Secondary | ICD-10-CM | POA: Diagnosis not present

## 2019-07-07 DIAGNOSIS — E1165 Type 2 diabetes mellitus with hyperglycemia: Secondary | ICD-10-CM | POA: Diagnosis not present

## 2019-07-07 DIAGNOSIS — E1151 Type 2 diabetes mellitus with diabetic peripheral angiopathy without gangrene: Secondary | ICD-10-CM | POA: Diagnosis not present

## 2019-07-07 DIAGNOSIS — E1169 Type 2 diabetes mellitus with other specified complication: Secondary | ICD-10-CM | POA: Diagnosis not present

## 2019-07-07 DIAGNOSIS — I209 Angina pectoris, unspecified: Secondary | ICD-10-CM | POA: Diagnosis not present

## 2019-07-07 DIAGNOSIS — I2510 Atherosclerotic heart disease of native coronary artery without angina pectoris: Secondary | ICD-10-CM | POA: Diagnosis not present

## 2019-07-28 DIAGNOSIS — I2510 Atherosclerotic heart disease of native coronary artery without angina pectoris: Secondary | ICD-10-CM | POA: Diagnosis not present

## 2019-07-28 DIAGNOSIS — E1165 Type 2 diabetes mellitus with hyperglycemia: Secondary | ICD-10-CM | POA: Diagnosis not present

## 2019-07-28 DIAGNOSIS — I209 Angina pectoris, unspecified: Secondary | ICD-10-CM | POA: Diagnosis not present

## 2019-07-28 DIAGNOSIS — E7800 Pure hypercholesterolemia, unspecified: Secondary | ICD-10-CM | POA: Diagnosis not present

## 2019-07-28 DIAGNOSIS — E1151 Type 2 diabetes mellitus with diabetic peripheral angiopathy without gangrene: Secondary | ICD-10-CM | POA: Diagnosis not present

## 2019-07-28 DIAGNOSIS — E1169 Type 2 diabetes mellitus with other specified complication: Secondary | ICD-10-CM | POA: Diagnosis not present

## 2019-07-28 DIAGNOSIS — N401 Enlarged prostate with lower urinary tract symptoms: Secondary | ICD-10-CM | POA: Diagnosis not present

## 2019-08-26 DIAGNOSIS — I2510 Atherosclerotic heart disease of native coronary artery without angina pectoris: Secondary | ICD-10-CM | POA: Diagnosis not present

## 2019-08-26 DIAGNOSIS — I209 Angina pectoris, unspecified: Secondary | ICD-10-CM | POA: Diagnosis not present

## 2019-08-26 DIAGNOSIS — N401 Enlarged prostate with lower urinary tract symptoms: Secondary | ICD-10-CM | POA: Diagnosis not present

## 2019-08-26 DIAGNOSIS — E1151 Type 2 diabetes mellitus with diabetic peripheral angiopathy without gangrene: Secondary | ICD-10-CM | POA: Diagnosis not present

## 2019-08-26 DIAGNOSIS — E1169 Type 2 diabetes mellitus with other specified complication: Secondary | ICD-10-CM | POA: Diagnosis not present

## 2019-08-26 DIAGNOSIS — E1165 Type 2 diabetes mellitus with hyperglycemia: Secondary | ICD-10-CM | POA: Diagnosis not present

## 2019-08-26 DIAGNOSIS — E7800 Pure hypercholesterolemia, unspecified: Secondary | ICD-10-CM | POA: Diagnosis not present

## 2019-09-05 ENCOUNTER — Telehealth: Payer: Self-pay

## 2019-09-05 NOTE — Telephone Encounter (Signed)
   [facility] Pre-operative Risk Assessment    Request for surgical clearance:  1. What type of surgery is being performed? Colonoscopy/endoscopy  2. When is this surgery scheduled? TBD   3. What type of clearance is required (medical clearance vs. Pharmacy clearance to hold med vs. Both)? Both  4. Are there any medications that need to be held prior to surgery and how long? Plavix   5. Practice name and name of physician performing surgery? Dr. Paulita Fujita / [facility]   6. What is your office phone number [phone]    7.   What is your office fax number [phone]  8.   Anesthesia type (None, local, MAC, general) ? unknown   Sherrie Mustache 09/05/2019, 4:43 PM  _________________________________________________________________   (provider comments below)

## 2019-09-08 NOTE — Telephone Encounter (Signed)
   Primary Cardiologist:Jonathan Gwenlyn Found, MD  Chart reviewed as part of pre-operative protocol coverage. Because of Reginald Little past medical history and time since last visit, he/she will require a follow-up visit in order to better assess preoperative cardiovascular risk.  Pre-op covering staff: - Please schedule appointment and call patient to inform them. - Please contact requesting surgeon's office via preferred method (i.e, phone, fax) to inform them of need for appointment prior to surgery.  If applicable, this message will also be routed to pharmacy pool and/or primary cardiologist for input on holding anticoagulant/antiplatelet agent as requested below so that this information is available at time of patient's appointment.   Reginald Kicks, NP  09/08/2019, 11:16 AM

## 2019-09-08 NOTE — Telephone Encounter (Signed)
Pt has been scheduled to see [name], NP, 09/17/19.  Will route to the requesting surgeon's office to make them aware.

## 2019-09-17 ENCOUNTER — Other Ambulatory Visit: Payer: Self-pay

## 2019-09-17 ENCOUNTER — Encounter: Payer: Self-pay | Admitting: Physician Assistant

## 2019-09-17 ENCOUNTER — Ambulatory Visit: Payer: Medicare HMO | Admitting: Physician Assistant

## 2019-09-17 VITALS — BP 132/70 | HR 72 | Ht 68.0 in | Wt 196.0 lb

## 2019-09-17 DIAGNOSIS — Z01810 Encounter for preprocedural cardiovascular examination: Secondary | ICD-10-CM | POA: Diagnosis not present

## 2019-09-17 DIAGNOSIS — I10 Essential (primary) hypertension: Secondary | ICD-10-CM | POA: Diagnosis not present

## 2019-09-17 DIAGNOSIS — E785 Hyperlipidemia, unspecified: Secondary | ICD-10-CM | POA: Diagnosis not present

## 2019-09-17 DIAGNOSIS — I6523 Occlusion and stenosis of bilateral carotid arteries: Secondary | ICD-10-CM | POA: Diagnosis not present

## 2019-09-17 DIAGNOSIS — I2510 Atherosclerotic heart disease of native coronary artery without angina pectoris: Secondary | ICD-10-CM

## 2019-09-17 NOTE — Patient Instructions (Signed)
Medication Instructions:  Your physician recommends that you continue on your current medications as directed. Please refer to the Current Medication list given to you today.  *If you need a refill on your cardiac medications before your next appointment, please call your pharmacy*   Testing/Procedures: Your physician has requested that you have a carotid duplex (prior to your follow-up with Dr. Gwenlyn Found). This test is an ultrasound of the carotid arteries in your neck. It looks at blood flow through these arteries that supply the brain with blood. Allow one hour for this exam. There are no restrictions or special instructions.   Follow-Up: At [facility], you and your health needs are our priority.  As part of our continuing mission to provide you with exceptional heart care, we have created designated Provider Care Teams.  These Care Teams include your primary Cardiologist (physician) and Advanced Practice Providers (APPs -  Physician Assistants and Nurse Practitioners) who all work together to provide you with the care you need, when you need it.  We recommend signing up for the patient portal called "MyChart".  Sign up information is provided on this After Visit Summary.  MyChart is used to connect with patients for Virtual Visits (Telemedicine).  Patients are able to view lab/test results, encounter notes, upcoming appointments, etc.  Non-urgent messages can be sent to your provider as well.   To learn more about what you can do with MyChart, go to [email or web address].    Your next appointment:   12 month(s)  The format for your next appointment:   In Person  Provider:   You may see Quay Burow, MD or one of the following Advanced Practice Providers on your designated Care Team:    Kerin Ransom, PA-C  [place], [place]  Coletta Memos, [place]    Other Instructions You are cleared for your colonoscopy.

## 2019-09-17 NOTE — Progress Notes (Signed)
Cardiology Office Note   Date:  09/17/2019   ID:  Reginald Little, DOB [birth year], MRN [id number]  PCP:  Reginald Manes, MD Cardiologist:  Reginald Burow, MD 06/07/2018 Electrphysiologist: None Reginald Ferries, PA-C   No chief complaint on file.   History of Present Illness: Reginald Little is a 69 y.o. male with a history of DES RCA 2013 [facility]), DES OM2 & RPL1 2015, Patent stents 2016, MV 2018 w/ scar vs attn, no ischemia, EF 62%, DM, HTN, HLD, GERD, obesity, carotid dz s/p L CEA and +subclavian stenosis  Reginald Little presents for preop clearance and to hold Plavix for colonoscopy  He is exercising in the neighborhood, walking about 30" at a time. Does this regularly, does not have regular CP w/ exertion.   His legs don't hurt any more, he has been taking Omega XL, feels that has helped.   He gets chest pain at times. It is on the L side. Episodic, has had for years. No exertional component. Was told he has a peptic stomach years ago. Was on Nexium, now on Protonix. Was on omeprazole at one time, they changed it (?because of the Plavix).  He does not feel that he ever gets heartburn, never takes antacids.  He has been watching the carbs, uses an air fryer. He has lost almost 20 lbs.   His A1c was 8.9, MD changed meds and pt feels he is doing better.  Blood sugar this a.m. was 122.  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    Past Medical History:  Diagnosis Date  . Atypical chest pain   . Carotid artery disease ([place])    a. s/p L CEA  . Coronary artery disease    a. s/p DES x 2 to the RCA (05/2012 at [facility]) b. 05/2014 Abnl MV w/ inf/inflat partially rev defect;  c. s/p DES of OM2 and RPL1 (05/2014)  d. s/p LHC on 10/06/14 with patent stents and non-obs CAD; e. 06/2016 MV: low risk, EF 62%, fixed medium-sized, mild basal and inf defect - likely attenuation. No ischemia.  . Diabetes mellitus without complication ([place])   . GERD (gastroesophageal reflux disease)   . HLD (hyperlipidemia)   .  Hypertension   . Obesity   . Syncope    a. 10/2014 - felt to be 2/2 hypotension after recent imdur titration.    Past Surgical History:  Procedure Laterality Date  . CAROTID ENDARTERECTOMY    . CORONARY ANGIOPLASTY WITH STENT PLACEMENT    . LEFT HEART CATHETERIZATION WITH CORONARY ANGIOGRAM N/A 06/08/2014   Procedure: LEFT HEART CATHETERIZATION WITH CORONARY ANGIOGRAM;  Surgeon: Blane Ohara, MD;  Location: [facility] CATH LAB;  Service: Cardiovascular;  Laterality: N/A;  . LEFT HEART CATHETERIZATION WITH CORONARY ANGIOGRAM N/A 10/06/2014   Procedure: LEFT HEART CATHETERIZATION WITH CORONARY ANGIOGRAM;  Surgeon: Leonie Man, MD;  Location: [facility] CATH LAB;  Service: Cardiovascular;  Laterality: N/A;    Current Outpatient Medications  Medication Sig Dispense Refill  . aspirin EC 81 MG tablet Take 81 mg by mouth daily.    Marland Kitchen atenolol (TENORMIN) 50 MG tablet Take 1 tablet (50 mg total) by mouth daily. 90 tablet 1  . Cholecalciferol (VITAMIN D3) 1000 units CAPS Take 1,000 Units by mouth daily.    . clopidogrel (PLAVIX) 75 MG tablet TAKE 1 TABLET EVERY DAY (Patient taking differently: Take 75 mg by mouth daily. ) 90 tablet 0  . Coenzyme Q10 (CO Q-10) 300 MG CAPS Take 300 mg  by mouth daily.    Marland Kitchen doxycycline (VIBRAMYCIN) 100 MG capsule Take 1 capsule (100 mg total) by mouth 2 (two) times daily. 20 capsule 0  . furosemide (LASIX) 20 MG tablet TAKE 1 TABLET EVERY DAY (Patient taking differently: Take 20 mg by mouth daily. ) 90 tablet 1  . glipiZIDE (GLUCOTROL XL) 10 MG 24 hr tablet Take 10 mg by mouth daily.    . isosorbide mononitrate (IMDUR) 60 MG 24 hr tablet Take 2 tablets (120 mg total) by mouth daily. 180 tablet 0  . metFORMIN (GLUCOPHAGE-XR) 500 MG 24 hr tablet Take 500 mg by mouth at bedtime.    . nitroGLYCERIN (NITROSTAT) 0.4 MG SL tablet Place 1 tablet (0.4 mg total) every 5 (five) minutes x 3 doses as needed under the tongue for chest pain. 90 tablet 3  . ondansetron (ZOFRAN ODT) 4 MG  disintegrating tablet Take 1 tablet (4 mg total) by mouth every 8 (eight) hours as needed for nausea or vomiting. 20 tablet 0  . pantoprazole (PROTONIX) 40 MG tablet TAKE 1 TABLET DAILY IF NEEDED (Patient taking differently: Take 40 mg by mouth daily. ) 90 tablet 3  . rosuvastatin (CRESTOR) 20 MG tablet TAKE 1 TABLET EVERY DAY (Patient taking differently: Take 20 mg by mouth at bedtime. ) 90 tablet 0  . tamsulosin (FLOMAX) 0.4 MG CAPS capsule Take 0.4 mg by mouth at bedtime.     No current facility-administered medications for this visit.    Allergies:   Penicillins    Social History:  The patient  reports that he quit smoking about 7 years ago. He has never used smokeless tobacco. He reports that he does not drink alcohol or use drugs.   Family History:  The patient's family history includes COPD (age of onset: 6) in his mother; Heart attack (age of onset: 21) in his father.  He indicated that his mother is deceased. He indicated that his father is deceased. He indicated that his sister is alive.   ROS:  Please see the history of present illness. All other systems are reviewed and negative.    PHYSICAL EXAM: VS:  BP 132/70   Pulse 72   Ht 5\' 8"  (1.727 m)   Wt 196 lb (88.9 kg)   BMI 29.80 kg/m  , BMI Body mass index is 29.8 kg/m. GEN: Well nourished, well developed, male in no acute distress HEENT: normal for age  Neck: no JVD, no carotid bruit, no masses Cardiac: RRR; soft murmur, no rubs, or gallops Respiratory:  clear to auscultation bilaterally, normal work of breathing GI: soft, nontender, nondistended, + BS MS: no deformity or atrophy; no edema; distal pulses are 2+ in all 4 extremities  Skin: warm and dry, no rash Neuro:  Strength and sensation are intact Psych: euthymic mood, full affect   EKG:  EKG is ordered today. The ekg ordered today demonstrates SR, HR 72, no acute ischemic changes  ECHO: 01/24/2018 - Left ventricle: The cavity size was normal. Wall  thickness was  increased in a pattern of mild LVH. There was mild focal basal  hypertrophy of the septum. Systolic function was normal. The  estimated ejection fraction was in the range of 55% to 60%. Wall  motion was normal; there were no regional wall motion  abnormalities.  - Aortic valve: Trileaflet; mildly thickened, mildly calcified  leaflets.  - Aorta: Aortic root dimension: 41 mm (ED).  - Ascending aorta: The ascending aorta was mildly dilated.  - Mitral valve: Calcified  annulus. Mildly thickened leaflets .   CATH: 10/06/2014 FINDINGS:  Hemodynamics:   Central Aortic Pressure / Mean: 110/62/79 mmHg  Left Ventricular Pressure / LVEDP:   116/9/19 mmHg  Left Ventriculography:  EF: 65  %  Wall Motion: Normal   Coronary Anatomy:  Dominance: Right   Left Main: Large caliber, long mainstem with a actual septal perforator before trifurcating into the LAD, Ramus Intermedius and Circumflex. Angiographically normal.  LAD: Normal caliber vessel that is patent to the apex. There are mild diffuse irregularities with a roughly 40% focal lesion in the mid vessel. No high-grade lesions noted. There are 3 diagonal branches that are at least moderate caliber with minimal luminal irregularities.  Left Circumflex: Normal caliber, nondominant vessel with 2 obtuse marginal branches. First OM is patent and a second OM are widely patent proximal stent. The remainder of both OM vessels are free of disease. The small AV groove branch and terminates as a small posterolateral artery with no significant disease.   OM1: small-moderate caliber vessel; angiographic normal.   OM 2: small-moderate-caliber vessel with widely patent proximal stent.  . Ramus intermedius: moderate large-caliber vessel that bifurcates in the mid vessel into 2 small moderate caliber branches. Mild luminal irregularities    RCA: Large-caliber, dominant vessel with a high, anterior origin. The vessel is  extremely tortuous with at least 3 major bends. It is heavily stented throughout the entire mid segment with mild in-stent stenosis of roughly 30-40%. The vessel bifurcates distally into the RPDA and the  Right Posterior AV Groove Branch (RPAV)  RPDA: Moderate caliber vessel. Angiographically normal.  RPL Sysytem:The RPAV begins as a small moderate caliber vessel it bifurcates into 2 posterolateral branches. There is a widely patent stent from the RPAV into RPL 1. No problems with flow in the jailed RPL 2.   MEDICATIONS:  Anesthesia:  Local Lidocaine 2 ml  Sedation:  2 mg IV Versed, 50 mcg IV fentanyl ;  Omnipaque Contrast: 65 ml  Anticoagulation:  IV Heparin 5000 Units  Anti-Platelet Agent:  Pt is on Plavix  Radial Cocktail: 5 mg Verapamil, 400 mcg NTG, 2 ml 2% Lidocaine in 10 ml NS   PATIENT DISPOSITION:    The patient was transferred to the PACU holding area in a hemodynamicaly stable, chest pain free condition.  The patient tolerated the procedure well, and there were no complications.  EBL:   < 10 ml  The patient was stable before, during, and after the procedure.  POST-OPERATIVE DIAGNOSIS:    2+ vessel disease with patent stents throughout the entire mid RCA and posterolateral branch of the RCA as well as OM 2.  Preserved LVEF with moderately elevated LVEDP despite having normal systolic pressures.  Mild (5-10 mm) gradient across aortic valve  No angiographically significant lesion to explain the patient's presentation. Consider possible microvascular ischemia with diastolic dysfunction.  PLAN OF CARE:  Standard post radial cath care. Anticipate discharge later this afternoon after bed rest.   Continue aggressive risk factor modification - Titrate anti-anginal medications ; will increase Imdur to 60 mg for discharge.    CAROTID DOPPLERS: 04/09/2020 Summary:  Right Carotid: Velocities in the right ICA are consistent with a 1-39%  stenosis.   Left  Carotid: Patent ICA bypass graft with no evidence of a restenosis.   Vertebrals: Left vertebral artery demonstrates antegrade flow.        Right vertebral artery demonstrates high resistant flow.  Subclavians: Left subclavian artery flow was disturbed.  Normal flow hemodynamics were seen in the right subclavian  artery.    Recent Labs: 02/09/2019: ALT 28; BUN 15; Creatinine, Ser 1.25; Hemoglobin 14.7; Platelets 248; Potassium 3.8; Sodium 139  CBC    Component Value Date/Time   WBC 17.7 (H) 02/09/2019 1126   RBC 5.06 02/09/2019 1126   HGB 14.7 02/09/2019 1126   HCT 46.4 02/09/2019 1126   PLT 248 02/09/2019 1126   MCV 91.7 02/09/2019 1126   MCH 29.1 02/09/2019 1126   MCHC 31.7 02/09/2019 1126   RDW 14.1 02/09/2019 1126   LYMPHSABS 1.2 02/09/2019 1126   MONOABS 1.3 (H) 02/09/2019 1126   EOSABS 0.0 02/09/2019 1126   BASOSABS 0.1 02/09/2019 1126   CMP Latest Ref Rng & Units 02/09/2019 05/11/2017 05/11/2017  Glucose 70 - 99 mg/dL 211(H) 120(H) 122(H)  BUN 8 - 23 mg/dL 15 16 16   Creatinine 0.61 - 1.24 mg/dL 1.25(H) 0.90 0.95  Sodium 135 - 145 mmol/L 139 141 139  Potassium 3.5 - 5.1 mmol/L 3.8 4.1 4.1  Chloride 98 - 111 mmol/L 99 102 102  CO2 22 - 32 mmol/L 23 - 29  Calcium 8.9 - 10.3 mg/dL 9.7 - 9.1  Total Protein 6.5 - 8.1 g/dL 7.5 - 7.3  Total Bilirubin 0.3 - 1.2 mg/dL 2.6(H) - 1.8(H)  Alkaline Phos 38 - 126 U/L 57 - 75  AST 15 - 41 U/L 22 - 28  ALT 0 - 44 U/L 28 - 34     Lipid Panel Lab Results  Component Value Date   CHOL 128 10/06/2014   HDL 29 (L) 10/06/2014   LDLCALC 80 10/06/2014   TRIG 96 10/06/2014   CHOLHDL 4.4 10/06/2014      Wt Readings from Last 3 Encounters:  09/17/19 196 lb (88.9 kg)  02/09/19 212 lb (96.2 kg)  07/25/18 209 lb (94.8 kg)     Other studies Reviewed: Additional studies/ records that were reviewed today include: Office notes, hospital records and testing.  ASSESSMENT AND PLAN:  1.  Preoperative cardiovascular  examination: -The RCRI is 2 giving him a 6.6% risk of major cardiac events. -However, his activity level is good activity Status Index score is 37.5, giving him a functional capacity in METS of 7.34. -He is at acceptable risk for the planned procedure without further cardiac work-up. -Spoke with Dr. Alvester Chou, okay to hold the aspirin and Plavix for the procedure  2.  CAD: -He is on good medical therapy with aspirin, beta-blocker, Plavix, statin, high-dose Imdur. -No ischemic symptoms, no testing needed at this time.  3.  Hypertension: -His blood pressure is under good control on atenolol 50 mg daily, Imdur 60 mg twice daily, Lasix 20 mg a day -No med changes  4.  Hyperlipidemia: - His lipids are checked by his PCP, LDL was 29 in December 2020 -Continue Crestor  5.  Bilateral carotid disease: -He has had a left CEA and had no significant disease at last check. -Right side had 1-39% stenosis -Follow-up in a year.   Current medicines are reviewed at length with the patient today.  The patient does not have concerns regarding medicines.  The following changes have been made:  no change  Labs/ tests ordered today include:  No orders of the defined types were placed in this encounter.    Disposition:   FU with Reginald Burow, MD  Signed, Reginald Ferries, PA-C  09/17/2019 10:55 AM    [place] Phone: [phone]; Fax: [phone]

## 2019-10-16 DIAGNOSIS — E1169 Type 2 diabetes mellitus with other specified complication: Secondary | ICD-10-CM | POA: Diagnosis not present

## 2019-10-16 DIAGNOSIS — N401 Enlarged prostate with lower urinary tract symptoms: Secondary | ICD-10-CM | POA: Diagnosis not present

## 2019-10-16 DIAGNOSIS — I209 Angina pectoris, unspecified: Secondary | ICD-10-CM | POA: Diagnosis not present

## 2019-10-16 DIAGNOSIS — E1151 Type 2 diabetes mellitus with diabetic peripheral angiopathy without gangrene: Secondary | ICD-10-CM | POA: Diagnosis not present

## 2019-10-16 DIAGNOSIS — E7800 Pure hypercholesterolemia, unspecified: Secondary | ICD-10-CM | POA: Diagnosis not present

## 2019-10-16 DIAGNOSIS — E1165 Type 2 diabetes mellitus with hyperglycemia: Secondary | ICD-10-CM | POA: Diagnosis not present

## 2019-10-16 DIAGNOSIS — I2510 Atherosclerotic heart disease of native coronary artery without angina pectoris: Secondary | ICD-10-CM | POA: Diagnosis not present

## 2019-11-24 DIAGNOSIS — E1169 Type 2 diabetes mellitus with other specified complication: Secondary | ICD-10-CM | POA: Diagnosis not present

## 2019-11-24 DIAGNOSIS — I2510 Atherosclerotic heart disease of native coronary artery without angina pectoris: Secondary | ICD-10-CM | POA: Diagnosis not present

## 2019-11-24 DIAGNOSIS — E1151 Type 2 diabetes mellitus with diabetic peripheral angiopathy without gangrene: Secondary | ICD-10-CM | POA: Diagnosis not present

## 2019-11-24 DIAGNOSIS — E7800 Pure hypercholesterolemia, unspecified: Secondary | ICD-10-CM | POA: Diagnosis not present

## 2019-12-24 DIAGNOSIS — Z1389 Encounter for screening for other disorder: Secondary | ICD-10-CM | POA: Diagnosis not present

## 2019-12-24 DIAGNOSIS — E1169 Type 2 diabetes mellitus with other specified complication: Secondary | ICD-10-CM | POA: Diagnosis not present

## 2019-12-24 DIAGNOSIS — E7800 Pure hypercholesterolemia, unspecified: Secondary | ICD-10-CM | POA: Diagnosis not present

## 2019-12-24 DIAGNOSIS — I700 Atherosclerosis of aorta: Secondary | ICD-10-CM | POA: Diagnosis not present

## 2019-12-24 DIAGNOSIS — E1151 Type 2 diabetes mellitus with diabetic peripheral angiopathy without gangrene: Secondary | ICD-10-CM | POA: Diagnosis not present

## 2019-12-24 DIAGNOSIS — Z79899 Other long term (current) drug therapy: Secondary | ICD-10-CM | POA: Diagnosis not present

## 2019-12-24 DIAGNOSIS — D126 Benign neoplasm of colon, unspecified: Secondary | ICD-10-CM | POA: Diagnosis not present

## 2019-12-24 DIAGNOSIS — Z7984 Long term (current) use of oral hypoglycemic drugs: Secondary | ICD-10-CM | POA: Diagnosis not present

## 2019-12-24 DIAGNOSIS — Z0000 Encounter for general adult medical examination without abnormal findings: Secondary | ICD-10-CM | POA: Diagnosis not present

## 2019-12-25 ENCOUNTER — Other Ambulatory Visit: Payer: Self-pay | Admitting: Geriatric Medicine

## 2019-12-25 DIAGNOSIS — R911 Solitary pulmonary nodule: Secondary | ICD-10-CM

## 2020-01-09 ENCOUNTER — Ambulatory Visit
Admission: RE | Admit: 2020-01-09 | Discharge: 2020-01-09 | Disposition: A | Discharge status: Home / Self Care | Payer: Medicare HMO | Source: Ambulatory Visit | Attending: Geriatric Medicine | Admitting: Geriatric Medicine

## 2020-01-09 DIAGNOSIS — R911 Solitary pulmonary nodule: Secondary | ICD-10-CM

## 2020-01-09 DIAGNOSIS — I700 Atherosclerosis of aorta: Secondary | ICD-10-CM | POA: Diagnosis not present

## 2020-01-09 DIAGNOSIS — R918 Other nonspecific abnormal finding of lung field: Secondary | ICD-10-CM | POA: Diagnosis not present

## 2020-01-09 DIAGNOSIS — I2510 Atherosclerotic heart disease of native coronary artery without angina pectoris: Secondary | ICD-10-CM | POA: Diagnosis not present

## 2020-01-09 DIAGNOSIS — J9811 Atelectasis: Secondary | ICD-10-CM | POA: Diagnosis not present

## 2020-02-09 DIAGNOSIS — I2510 Atherosclerotic heart disease of native coronary artery without angina pectoris: Secondary | ICD-10-CM | POA: Diagnosis not present

## 2020-02-09 DIAGNOSIS — E1169 Type 2 diabetes mellitus with other specified complication: Secondary | ICD-10-CM | POA: Diagnosis not present

## 2020-02-09 DIAGNOSIS — E1151 Type 2 diabetes mellitus with diabetic peripheral angiopathy without gangrene: Secondary | ICD-10-CM | POA: Diagnosis not present

## 2020-02-09 DIAGNOSIS — E7800 Pure hypercholesterolemia, unspecified: Secondary | ICD-10-CM | POA: Diagnosis not present

## 2020-02-23 DIAGNOSIS — E7800 Pure hypercholesterolemia, unspecified: Secondary | ICD-10-CM | POA: Diagnosis not present

## 2020-02-23 DIAGNOSIS — E1151 Type 2 diabetes mellitus with diabetic peripheral angiopathy without gangrene: Secondary | ICD-10-CM | POA: Diagnosis not present

## 2020-02-23 DIAGNOSIS — E1169 Type 2 diabetes mellitus with other specified complication: Secondary | ICD-10-CM | POA: Diagnosis not present

## 2020-02-23 DIAGNOSIS — E1165 Type 2 diabetes mellitus with hyperglycemia: Secondary | ICD-10-CM | POA: Diagnosis not present

## 2020-02-23 DIAGNOSIS — I2510 Atherosclerotic heart disease of native coronary artery without angina pectoris: Secondary | ICD-10-CM | POA: Diagnosis not present

## 2020-04-06 DIAGNOSIS — H16201 Unspecified keratoconjunctivitis, right eye: Secondary | ICD-10-CM | POA: Diagnosis not present

## 2020-04-06 DIAGNOSIS — H01112 Allergic dermatitis of right lower eyelid: Secondary | ICD-10-CM | POA: Diagnosis not present

## 2020-04-09 ENCOUNTER — Ambulatory Visit (HOSPITAL_COMMUNITY)
Admission: RE | Admit: 2020-04-09 | Discharge: 2020-04-09 | Disposition: A | Discharge status: Home / Self Care | Payer: Medicare HMO | Source: Ambulatory Visit | Attending: Internal Medicine | Admitting: Internal Medicine

## 2020-04-09 ENCOUNTER — Other Ambulatory Visit: Payer: Self-pay

## 2020-04-09 ENCOUNTER — Other Ambulatory Visit (HOSPITAL_COMMUNITY): Payer: Self-pay | Admitting: Cardiovascular Disease

## 2020-04-09 DIAGNOSIS — I6523 Occlusion and stenosis of bilateral carotid arteries: Secondary | ICD-10-CM

## 2020-04-12 ENCOUNTER — Other Ambulatory Visit: Payer: Self-pay

## 2020-04-12 DIAGNOSIS — I6523 Occlusion and stenosis of bilateral carotid arteries: Secondary | ICD-10-CM

## 2020-04-14 DIAGNOSIS — H16011 Central corneal ulcer, right eye: Secondary | ICD-10-CM | POA: Diagnosis not present

## 2020-04-14 DIAGNOSIS — H16201 Unspecified keratoconjunctivitis, right eye: Secondary | ICD-10-CM | POA: Diagnosis not present

## 2020-04-22 DIAGNOSIS — I2510 Atherosclerotic heart disease of native coronary artery without angina pectoris: Secondary | ICD-10-CM | POA: Diagnosis not present

## 2020-04-22 DIAGNOSIS — K219 Gastro-esophageal reflux disease without esophagitis: Secondary | ICD-10-CM | POA: Diagnosis not present

## 2020-04-22 DIAGNOSIS — E7800 Pure hypercholesterolemia, unspecified: Secondary | ICD-10-CM | POA: Diagnosis not present

## 2020-04-22 DIAGNOSIS — E1169 Type 2 diabetes mellitus with other specified complication: Secondary | ICD-10-CM | POA: Diagnosis not present

## 2020-04-22 DIAGNOSIS — E1165 Type 2 diabetes mellitus with hyperglycemia: Secondary | ICD-10-CM | POA: Diagnosis not present

## 2020-04-22 DIAGNOSIS — E1151 Type 2 diabetes mellitus with diabetic peripheral angiopathy without gangrene: Secondary | ICD-10-CM | POA: Diagnosis not present

## 2020-06-09 DIAGNOSIS — K219 Gastro-esophageal reflux disease without esophagitis: Secondary | ICD-10-CM | POA: Diagnosis not present

## 2020-06-09 DIAGNOSIS — E1151 Type 2 diabetes mellitus with diabetic peripheral angiopathy without gangrene: Secondary | ICD-10-CM | POA: Diagnosis not present

## 2020-06-09 DIAGNOSIS — E7800 Pure hypercholesterolemia, unspecified: Secondary | ICD-10-CM | POA: Diagnosis not present

## 2020-06-09 DIAGNOSIS — E1169 Type 2 diabetes mellitus with other specified complication: Secondary | ICD-10-CM | POA: Diagnosis not present

## 2020-06-09 DIAGNOSIS — I2510 Atherosclerotic heart disease of native coronary artery without angina pectoris: Secondary | ICD-10-CM | POA: Diagnosis not present

## 2020-06-09 DIAGNOSIS — E1165 Type 2 diabetes mellitus with hyperglycemia: Secondary | ICD-10-CM | POA: Diagnosis not present

## 2020-06-22 DIAGNOSIS — I2510 Atherosclerotic heart disease of native coronary artery without angina pectoris: Secondary | ICD-10-CM | POA: Diagnosis not present

## 2020-06-22 DIAGNOSIS — Z79899 Other long term (current) drug therapy: Secondary | ICD-10-CM | POA: Diagnosis not present

## 2020-06-22 DIAGNOSIS — E7800 Pure hypercholesterolemia, unspecified: Secondary | ICD-10-CM | POA: Diagnosis not present

## 2020-06-22 DIAGNOSIS — I208 Other forms of angina pectoris: Secondary | ICD-10-CM | POA: Diagnosis not present

## 2020-06-22 DIAGNOSIS — E1169 Type 2 diabetes mellitus with other specified complication: Secondary | ICD-10-CM | POA: Diagnosis not present

## 2020-06-22 DIAGNOSIS — D126 Benign neoplasm of colon, unspecified: Secondary | ICD-10-CM | POA: Diagnosis not present

## 2020-06-22 DIAGNOSIS — L989 Disorder of the skin and subcutaneous tissue, unspecified: Secondary | ICD-10-CM | POA: Diagnosis not present

## 2020-06-22 DIAGNOSIS — I700 Atherosclerosis of aorta: Secondary | ICD-10-CM | POA: Diagnosis not present

## 2020-06-22 DIAGNOSIS — Z7984 Long term (current) use of oral hypoglycemic drugs: Secondary | ICD-10-CM | POA: Diagnosis not present

## 2020-06-24 DIAGNOSIS — C44319 Basal cell carcinoma of skin of other parts of face: Secondary | ICD-10-CM | POA: Diagnosis not present

## 2020-06-24 DIAGNOSIS — Z85828 Personal history of other malignant neoplasm of skin: Secondary | ICD-10-CM | POA: Diagnosis not present

## 2020-06-24 DIAGNOSIS — C44629 Squamous cell carcinoma of skin of left upper limb, including shoulder: Secondary | ICD-10-CM | POA: Diagnosis not present

## 2020-06-30 DIAGNOSIS — H40022 Open angle with borderline findings, high risk, left eye: Secondary | ICD-10-CM | POA: Diagnosis not present

## 2020-06-30 DIAGNOSIS — H5213 Myopia, bilateral: Secondary | ICD-10-CM | POA: Diagnosis not present

## 2020-06-30 DIAGNOSIS — H40053 Ocular hypertension, bilateral: Secondary | ICD-10-CM | POA: Diagnosis not present

## 2020-06-30 DIAGNOSIS — E119 Type 2 diabetes mellitus without complications: Secondary | ICD-10-CM | POA: Diagnosis not present

## 2020-07-02 DIAGNOSIS — C44319 Basal cell carcinoma of skin of other parts of face: Secondary | ICD-10-CM | POA: Diagnosis not present

## 2020-07-02 DIAGNOSIS — Z85828 Personal history of other malignant neoplasm of skin: Secondary | ICD-10-CM | POA: Diagnosis not present

## 2020-07-09 DIAGNOSIS — E1169 Type 2 diabetes mellitus with other specified complication: Secondary | ICD-10-CM | POA: Diagnosis not present

## 2020-07-09 DIAGNOSIS — E7800 Pure hypercholesterolemia, unspecified: Secondary | ICD-10-CM | POA: Diagnosis not present

## 2020-07-09 DIAGNOSIS — I2510 Atherosclerotic heart disease of native coronary artery without angina pectoris: Secondary | ICD-10-CM | POA: Diagnosis not present

## 2020-07-09 DIAGNOSIS — I208 Other forms of angina pectoris: Secondary | ICD-10-CM | POA: Diagnosis not present

## 2020-07-16 DIAGNOSIS — H401132 Primary open-angle glaucoma, bilateral, moderate stage: Secondary | ICD-10-CM | POA: Diagnosis not present

## 2020-09-01 DIAGNOSIS — E7800 Pure hypercholesterolemia, unspecified: Secondary | ICD-10-CM | POA: Diagnosis not present

## 2020-09-01 DIAGNOSIS — I2510 Atherosclerotic heart disease of native coronary artery without angina pectoris: Secondary | ICD-10-CM | POA: Diagnosis not present

## 2020-09-01 DIAGNOSIS — I208 Other forms of angina pectoris: Secondary | ICD-10-CM | POA: Diagnosis not present

## 2020-09-01 DIAGNOSIS — K219 Gastro-esophageal reflux disease without esophagitis: Secondary | ICD-10-CM | POA: Diagnosis not present

## 2020-09-01 DIAGNOSIS — E1165 Type 2 diabetes mellitus with hyperglycemia: Secondary | ICD-10-CM | POA: Diagnosis not present

## 2020-09-01 DIAGNOSIS — E1169 Type 2 diabetes mellitus with other specified complication: Secondary | ICD-10-CM | POA: Diagnosis not present

## 2020-10-07 DIAGNOSIS — E1165 Type 2 diabetes mellitus with hyperglycemia: Secondary | ICD-10-CM | POA: Diagnosis not present

## 2020-10-07 DIAGNOSIS — K219 Gastro-esophageal reflux disease without esophagitis: Secondary | ICD-10-CM | POA: Diagnosis not present

## 2020-10-07 DIAGNOSIS — E1169 Type 2 diabetes mellitus with other specified complication: Secondary | ICD-10-CM | POA: Diagnosis not present

## 2020-10-07 DIAGNOSIS — I2510 Atherosclerotic heart disease of native coronary artery without angina pectoris: Secondary | ICD-10-CM | POA: Diagnosis not present

## 2020-10-07 DIAGNOSIS — E7800 Pure hypercholesterolemia, unspecified: Secondary | ICD-10-CM | POA: Diagnosis not present

## 2020-10-07 DIAGNOSIS — I208 Other forms of angina pectoris: Secondary | ICD-10-CM | POA: Diagnosis not present

## 2020-10-14 DIAGNOSIS — H401122 Primary open-angle glaucoma, left eye, moderate stage: Secondary | ICD-10-CM | POA: Diagnosis not present

## 2020-10-14 DIAGNOSIS — H25813 Combined forms of age-related cataract, bilateral: Secondary | ICD-10-CM | POA: Diagnosis not present

## 2020-11-19 DIAGNOSIS — I208 Other forms of angina pectoris: Secondary | ICD-10-CM | POA: Diagnosis not present

## 2020-11-19 DIAGNOSIS — I2510 Atherosclerotic heart disease of native coronary artery without angina pectoris: Secondary | ICD-10-CM | POA: Diagnosis not present

## 2020-11-19 DIAGNOSIS — K219 Gastro-esophageal reflux disease without esophagitis: Secondary | ICD-10-CM | POA: Diagnosis not present

## 2020-11-19 DIAGNOSIS — E1165 Type 2 diabetes mellitus with hyperglycemia: Secondary | ICD-10-CM | POA: Diagnosis not present

## 2020-11-19 DIAGNOSIS — E1169 Type 2 diabetes mellitus with other specified complication: Secondary | ICD-10-CM | POA: Diagnosis not present

## 2020-11-19 DIAGNOSIS — E7800 Pure hypercholesterolemia, unspecified: Secondary | ICD-10-CM | POA: Diagnosis not present

## 2020-12-20 ENCOUNTER — Other Ambulatory Visit: Payer: Self-pay

## 2020-12-20 MED ORDER — CLOPIDOGREL BISULFATE 75 MG PO TABS: 75.0000 mg | ORAL_TABLET | Freq: Every day | ORAL | 0 refills | Status: DC

## 2020-12-23 DIAGNOSIS — L814 Other melanin hyperpigmentation: Secondary | ICD-10-CM | POA: Diagnosis not present

## 2020-12-23 DIAGNOSIS — Z85828 Personal history of other malignant neoplasm of skin: Secondary | ICD-10-CM | POA: Diagnosis not present

## 2020-12-23 DIAGNOSIS — L820 Inflamed seborrheic keratosis: Secondary | ICD-10-CM | POA: Diagnosis not present

## 2020-12-23 DIAGNOSIS — L821 Other seborrheic keratosis: Secondary | ICD-10-CM | POA: Diagnosis not present

## 2020-12-23 DIAGNOSIS — L570 Actinic keratosis: Secondary | ICD-10-CM | POA: Diagnosis not present

## 2020-12-23 DIAGNOSIS — D1801 Hemangioma of skin and subcutaneous tissue: Secondary | ICD-10-CM | POA: Diagnosis not present

## 2020-12-28 DIAGNOSIS — R911 Solitary pulmonary nodule: Secondary | ICD-10-CM | POA: Diagnosis not present

## 2020-12-28 DIAGNOSIS — Z79899 Other long term (current) drug therapy: Secondary | ICD-10-CM | POA: Diagnosis not present

## 2020-12-28 DIAGNOSIS — I700 Atherosclerosis of aorta: Secondary | ICD-10-CM | POA: Diagnosis not present

## 2020-12-28 DIAGNOSIS — Z0000 Encounter for general adult medical examination without abnormal findings: Secondary | ICD-10-CM | POA: Diagnosis not present

## 2020-12-28 DIAGNOSIS — E1151 Type 2 diabetes mellitus with diabetic peripheral angiopathy without gangrene: Secondary | ICD-10-CM | POA: Diagnosis not present

## 2020-12-28 DIAGNOSIS — E1169 Type 2 diabetes mellitus with other specified complication: Secondary | ICD-10-CM | POA: Diagnosis not present

## 2020-12-28 DIAGNOSIS — Z1389 Encounter for screening for other disorder: Secondary | ICD-10-CM | POA: Diagnosis not present

## 2020-12-28 DIAGNOSIS — E7800 Pure hypercholesterolemia, unspecified: Secondary | ICD-10-CM | POA: Diagnosis not present

## 2020-12-28 DIAGNOSIS — Z1159 Encounter for screening for other viral diseases: Secondary | ICD-10-CM | POA: Diagnosis not present

## 2020-12-28 DIAGNOSIS — K219 Gastro-esophageal reflux disease without esophagitis: Secondary | ICD-10-CM | POA: Diagnosis not present

## 2020-12-29 DIAGNOSIS — E1151 Type 2 diabetes mellitus with diabetic peripheral angiopathy without gangrene: Secondary | ICD-10-CM | POA: Diagnosis not present

## 2021-01-05 DIAGNOSIS — E7800 Pure hypercholesterolemia, unspecified: Secondary | ICD-10-CM | POA: Diagnosis not present

## 2021-01-05 DIAGNOSIS — K219 Gastro-esophageal reflux disease without esophagitis: Secondary | ICD-10-CM | POA: Diagnosis not present

## 2021-01-05 DIAGNOSIS — E1169 Type 2 diabetes mellitus with other specified complication: Secondary | ICD-10-CM | POA: Diagnosis not present

## 2021-01-05 DIAGNOSIS — E1151 Type 2 diabetes mellitus with diabetic peripheral angiopathy without gangrene: Secondary | ICD-10-CM | POA: Diagnosis not present

## 2021-01-05 DIAGNOSIS — I208 Other forms of angina pectoris: Secondary | ICD-10-CM | POA: Diagnosis not present

## 2021-01-25 DIAGNOSIS — H401122 Primary open-angle glaucoma, left eye, moderate stage: Secondary | ICD-10-CM | POA: Diagnosis not present

## 2021-01-25 DIAGNOSIS — H2513 Age-related nuclear cataract, bilateral: Secondary | ICD-10-CM | POA: Diagnosis not present

## 2021-03-07 ENCOUNTER — Other Ambulatory Visit: Payer: Self-pay | Admitting: Cardiovascular Disease

## 2021-04-12 ENCOUNTER — Encounter (HOSPITAL_COMMUNITY): Payer: Self-pay

## 2021-04-12 ENCOUNTER — Ambulatory Visit (HOSPITAL_COMMUNITY): Payer: Medicare HMO

## 2021-04-14 ENCOUNTER — Other Ambulatory Visit (HOSPITAL_COMMUNITY): Payer: Self-pay | Admitting: Cardiovascular Disease

## 2021-04-14 DIAGNOSIS — I6523 Occlusion and stenosis of bilateral carotid arteries: Secondary | ICD-10-CM

## 2021-04-25 ENCOUNTER — Ambulatory Visit (HOSPITAL_COMMUNITY): Payer: Medicare HMO

## 2021-04-26 DIAGNOSIS — H2513 Age-related nuclear cataract, bilateral: Secondary | ICD-10-CM | POA: Diagnosis not present

## 2021-04-26 DIAGNOSIS — H401122 Primary open-angle glaucoma, left eye, moderate stage: Secondary | ICD-10-CM | POA: Diagnosis not present

## 2021-05-12 ENCOUNTER — Other Ambulatory Visit: Payer: Self-pay

## 2021-05-12 MED ORDER — ISOSORBIDE MONONITRATE ER 60 MG PO TB24: 120.0000 mg | ORAL_TABLET | Freq: Every day | ORAL | 3 refills | Status: DC

## 2021-05-21 ENCOUNTER — Other Ambulatory Visit: Payer: Self-pay | Admitting: Cardiovascular Disease

## 2021-06-15 DIAGNOSIS — E7800 Pure hypercholesterolemia, unspecified: Secondary | ICD-10-CM | POA: Diagnosis not present

## 2021-06-15 DIAGNOSIS — E1151 Type 2 diabetes mellitus with diabetic peripheral angiopathy without gangrene: Secondary | ICD-10-CM | POA: Diagnosis not present

## 2021-06-15 DIAGNOSIS — I700 Atherosclerosis of aorta: Secondary | ICD-10-CM | POA: Diagnosis not present

## 2021-06-15 DIAGNOSIS — I209 Angina pectoris, unspecified: Secondary | ICD-10-CM | POA: Diagnosis not present

## 2021-07-27 DIAGNOSIS — H2513 Age-related nuclear cataract, bilateral: Secondary | ICD-10-CM | POA: Diagnosis not present

## 2021-07-27 DIAGNOSIS — H401122 Primary open-angle glaucoma, left eye, moderate stage: Secondary | ICD-10-CM | POA: Diagnosis not present

## 2021-07-27 DIAGNOSIS — E119 Type 2 diabetes mellitus without complications: Secondary | ICD-10-CM | POA: Diagnosis not present

## 2021-07-27 DIAGNOSIS — H01001 Unspecified blepharitis right upper eyelid: Secondary | ICD-10-CM | POA: Diagnosis not present

## 2021-08-02 ENCOUNTER — Encounter: Payer: Self-pay | Admitting: Cardiovascular Disease

## 2021-09-08 ENCOUNTER — Telehealth: Payer: Self-pay | Admitting: Cardiovascular Disease

## 2021-09-08 NOTE — Telephone Encounter (Signed)
[employer] aware pt is taking Rosuvastatin 20 mg Per med list ./cy ?

## 2021-09-08 NOTE — Telephone Encounter (Signed)
? ?  Crystal with [employer] called, she said she faxed an meds recommendation for this pt to start a high intensity statin drug due to CHF diagnosis. She said she faxed info on 03/21 ?

## 2021-11-03 ENCOUNTER — Other Ambulatory Visit: Payer: Self-pay | Admitting: *Deleted

## 2021-11-03 DIAGNOSIS — M25569 Pain in unspecified knee: Secondary | ICD-10-CM

## 2021-11-15 ENCOUNTER — Encounter: Payer: Self-pay | Admitting: Vascular Surgery

## 2021-11-15 ENCOUNTER — Ambulatory Visit (HOSPITAL_COMMUNITY)
Admission: RE | Admit: 2021-11-15 | Discharge: 2021-11-15 | Disposition: A | Discharge status: Home / Self Care | Payer: No Typology Code available for payment source | Source: Ambulatory Visit | Attending: Vascular Surgery | Admitting: Vascular Surgery

## 2021-11-15 ENCOUNTER — Ambulatory Visit (INDEPENDENT_AMBULATORY_CARE_PROVIDER_SITE_OTHER): Payer: No Typology Code available for payment source | Admitting: Vascular Surgery

## 2021-11-15 DIAGNOSIS — I70212 Atherosclerosis of native arteries of extremities with intermittent claudication, left leg: Secondary | ICD-10-CM

## 2021-11-15 DIAGNOSIS — I70219 Atherosclerosis of native arteries of extremities with intermittent claudication, unspecified extremity: Secondary | ICD-10-CM | POA: Insufficient documentation

## 2021-11-15 DIAGNOSIS — M25569 Pain in unspecified knee: Secondary | ICD-10-CM | POA: Diagnosis present

## 2021-11-15 NOTE — Progress Notes (Signed)
Patient name: Reginald Little MRN: [id number] DOB: 1951-04-14 Sex: male  REASON FOR CONSULT: Left lower extremity claudication  HPI: Reginald Little is a 71 y.o. male, with history of hypertension, hyperlipidemia, diabetes, coronary artery disease status post PCI as well as previous left carotid endarterectomy that presents for evaluation of left lower extremity claudication.  Patient states he gets severe cramping in his left calf and thigh that has been ongoing for the last 5 years.  This has been managed medically.  He states now he is very disabled by this.  He can no longer walk his dog.  He cannot mow his grass.  He cannot take a walk on the beach.  Ultimately he has to stop for about 15 minutes before resolved after short walks.  He was evaluated at the [facility] and ultimately sent for a CTA by Dr. Nicola Girt.  This showed moderate stenosis in the left common iliac artery as well as moderate stenosis in the left SFA proximally as well as distally at adductor canal.  He has two-vessel runoff in the peroneal and posterior tibial arteries.  No previous left leg interventions.  States his quality life is terrible because he cannot use his left leg.  Wants to exercise and lose weight.  Past Medical History:  Diagnosis Date   Atypical chest pain    Carotid artery disease ([place])    a. s/p L CEA   Coronary artery disease    a. s/p DES x 2 to the RCA (05/2012 at [facility]) b. 05/2014 Abnl MV w/ inf/inflat partially rev defect;  c. s/p DES of OM2 and RPL1 (05/2014)  d. s/p LHC on 10/06/14 with patent stents and non-obs CAD; e. 06/2016 MV: low risk, EF 62%, fixed medium-sized, mild basal and inf defect - likely attenuation. No ischemia.   Diabetes mellitus without complication ([facility])    GERD (gastroesophageal reflux disease)    HLD (hyperlipidemia)    Hypertension    Obesity    Syncope    a. 10/2014 - felt to be 2/2 hypotension after recent imdur titration.    Past Surgical History:  Procedure Laterality Date   CAROTID  ENDARTERECTOMY     CORONARY ANGIOPLASTY WITH STENT PLACEMENT     LEFT HEART CATHETERIZATION WITH CORONARY ANGIOGRAM N/A 06/08/2014   Procedure: LEFT HEART CATHETERIZATION WITH CORONARY ANGIOGRAM;  Surgeon: Blane Ohara, MD;  Location: [facility] CATH LAB;  Service: Cardiovascular;  Laterality: N/A;   LEFT HEART CATHETERIZATION WITH CORONARY ANGIOGRAM N/A 10/06/2014   Procedure: LEFT HEART CATHETERIZATION WITH CORONARY ANGIOGRAM;  Surgeon: Leonie Man, MD;  Location: [facility] CATH LAB;  Service: Cardiovascular;  Laterality: N/A;    Family History  Problem Relation Age of Onset   Heart attack Father 42   COPD Mother 85    SOCIAL HISTORY: Social History   Socioeconomic History   Marital status: Divorced    Spouse name: Not on file   Number of children: Not on file   Years of education: Not on file   Highest education level: Not on file  Occupational History   Occupation: Owns transportation brokering business  Tobacco Use   Smoking status: Former    Types: Cigarettes    Quit date: 06/05/2012    Years since quitting: 9.4   Smokeless tobacco: Never  Vaping Use   Vaping Use: Never used  Substance and Sexual Activity   Alcohol use: No   Drug use: No   Sexual activity: Not on file  Other Topics Concern  Not on file  Social History Narrative   Lives in [place] by himself.  Family and friends nearby.  He is a Psychologist, clinical.     Social Determinants of Health   Financial Resource Strain: Not on file  Food Insecurity: Not on file  Transportation Needs: Not on file  Physical Activity: Not on file  Stress: Not on file  Social Connections: Not on file  Intimate Partner Violence: Not on file    Allergies  Allergen Reactions   Penicillins Rash    Has patient had a PCN reaction causing immediate rash, facial/tongue/throat swelling, SOB or lightheadedness with hypotension: Yes Has patient had a PCN reaction causing severe rash involving mucus membranes or skin necrosis: No Has patient had a PCN  reaction that required hospitalization: No Has patient had a PCN reaction occurring within the last 10 years: No If all of the above answers are "NO", then may proceed with Cephalosporin use.     Current Outpatient Medications  Medication Sig Dispense Refill   aspirin EC 81 MG tablet Take 81 mg by mouth daily.     atenolol (TENORMIN) 50 MG tablet Take 1 tablet (50 mg total) by mouth daily. 90 tablet 1   Cholecalciferol (VITAMIN D3) 1000 units CAPS Take 1,000 Units by mouth daily.     clopidogrel (PLAVIX) 75 MG tablet TAKE 1 TABLET EVERY DAY (NEED MD APPOINTMENT FOR REFILLS) 90 tablet 1   Coenzyme Q10 (CO Q-10) 300 MG CAPS Take 300 mg by mouth daily.     furosemide (LASIX) 20 MG tablet TAKE 1 TABLET EVERY DAY (Patient taking differently: Take 20 mg by mouth daily.) 90 tablet 1   glipiZIDE (GLUCOTROL XL) 10 MG 24 hr tablet Take 10 mg by mouth daily.     isosorbide mononitrate (IMDUR) 60 MG 24 hr tablet Take 2 tablets (120 mg total) by mouth daily. 180 tablet 3   nitroGLYCERIN (NITROSTAT) 0.4 MG SL tablet Place 1 tablet (0.4 mg total) every 5 (five) minutes x 3 doses as needed under the tongue for chest pain. 90 tablet 3   pantoprazole (PROTONIX) 40 MG tablet TAKE 1 TABLET DAILY IF NEEDED (Patient taking differently: Take 40 mg by mouth daily.) 90 tablet 3   rosuvastatin (CRESTOR) 20 MG tablet TAKE 1 TABLET EVERY DAY (Patient taking differently: Take 20 mg by mouth at bedtime.) 90 tablet 0   doxycycline (VIBRAMYCIN) 100 MG capsule Take 1 capsule (100 mg total) by mouth 2 (two) times daily. 20 capsule 0   metFORMIN (GLUCOPHAGE-XR) 500 MG 24 hr tablet Take 500 mg by mouth at bedtime.     ondansetron (ZOFRAN ODT) 4 MG disintegrating tablet Take 1 tablet (4 mg total) by mouth every 8 (eight) hours as needed for nausea or vomiting. 20 tablet 0   tamsulosin (FLOMAX) 0.4 MG CAPS capsule Take 0.4 mg by mouth at bedtime.     No current facility-administered medications for this visit.    REVIEW OF  SYSTEMS:  '[X]'$  denotes positive finding, '[ ]'$  denotes negative finding Cardiac  Comments:  Chest pain or chest pressure:    Shortness of breath upon exertion:    Short of breath when lying flat:    Irregular heart rhythm:        Vascular    Pain in calf, thigh, or hip brought on by ambulation: x Left leg  Pain in feet at night that wakes you up from your sleep:     Blood clot in your veins:  Leg swelling:         Pulmonary    Oxygen at home:    Productive cough:     Wheezing:         Neurologic    Sudden weakness in arms or legs:     Sudden numbness in arms or legs:     Sudden onset of difficulty speaking or slurred speech:    Temporary loss of vision in one eye:     Problems with dizziness:         Gastrointestinal    Blood in stool:     Vomited blood:         Genitourinary    Burning when urinating:     Blood in urine:        Psychiatric    Major depression:         Hematologic    Bleeding problems:    Problems with blood clotting too easily:        Skin    Rashes or ulcers:        Constitutional    Fever or chills:      PHYSICAL EXAM: Vitals:   11/15/21 1344  BP: (!) 156/92  Pulse: 72  Resp: 18  Temp: 97.7 F (36.5 C)  TempSrc: Temporal  SpO2: 95%  Weight: 218 lb (98.9 kg)  Height: 5' 8.5" (1.74 m)    GENERAL: The patient is a well-nourished male, in no acute distress. The vital signs are documented above. CARDIAC: There is a regular rate and rhythm.  VASCULAR:  Palpable femoral pulses bilaterally No lower extremity tissue loss PULMONARY: No respiratory distress. ABDOMEN: Soft and non-tender. MUSCULOSKELETAL: There are no major deformities or cyanosis. NEUROLOGIC: No focal weakness or paresthesias are detected. SKIN: There are no ulcers or rashes noted. PSYCHIATRIC: The patient has a normal affect.  DATA:   CTA scan reviewed from 09/01/2021 with a focus on the left leg shows a moderate stenosis in the left common iliac artery as well as a  moderate stenosis in the proximal SFA and distal SFA at adductor canal as well as two-vessel runoff in the peroneal and posterior tibial arteries.  Assessment/Plan:  71 year old male with lifestyle-limiting claudication in the left lower extremity with evidence of multilevel disease on his CTA.  I discussed typically managing claudication with medical management and not intervention.  He states he has already tried conservative management for the last 5 years and his symptoms have worsened.  Now really unable to walk his dog or take care of his lawn etc.  In review of the CTA at the [place], looks like he has a moderate stenosis in his left common iliac artery as well as moderate stenosis in the proximal and distal SFA on the left.  I have recommended aortogram with lower extremity arteriogram with possible intervention with a focus on the left leg.  Discussed this being done in the Cath Lab under moderate sedation.  Discussed risk and benefits.   Marty Heck, MD [facility] Office: [phone]

## 2021-11-17 ENCOUNTER — Other Ambulatory Visit: Payer: Self-pay

## 2021-11-17 DIAGNOSIS — I739 Peripheral vascular disease, unspecified: Secondary | ICD-10-CM

## 2021-11-17 DIAGNOSIS — M25569 Pain in unspecified knee: Secondary | ICD-10-CM

## 2021-11-17 DIAGNOSIS — I70213 Atherosclerosis of native arteries of extremities with intermittent claudication, bilateral legs: Secondary | ICD-10-CM

## 2021-11-29 DIAGNOSIS — H401122 Primary open-angle glaucoma, left eye, moderate stage: Secondary | ICD-10-CM | POA: Diagnosis not present

## 2021-11-29 DIAGNOSIS — E119 Type 2 diabetes mellitus without complications: Secondary | ICD-10-CM | POA: Diagnosis not present

## 2021-11-29 DIAGNOSIS — H5340 Unspecified visual field defects: Secondary | ICD-10-CM | POA: Diagnosis not present

## 2021-11-29 DIAGNOSIS — H2513 Age-related nuclear cataract, bilateral: Secondary | ICD-10-CM | POA: Diagnosis not present

## 2021-12-01 ENCOUNTER — Encounter (HOSPITAL_COMMUNITY)
Admission: RE | Disposition: A | Discharge status: Home / Self Care | Payer: Self-pay | Source: Ambulatory Visit | Attending: Vascular Surgery

## 2021-12-01 ENCOUNTER — Other Ambulatory Visit: Payer: Self-pay

## 2021-12-01 ENCOUNTER — Encounter (HOSPITAL_COMMUNITY): Payer: Self-pay | Admitting: Vascular Surgery

## 2021-12-01 ENCOUNTER — Ambulatory Visit (HOSPITAL_COMMUNITY)
Admission: RE | Admit: 2021-12-01 | Discharge: 2021-12-01 | Disposition: A | Discharge status: Home / Self Care | Payer: No Typology Code available for payment source | Source: Ambulatory Visit | Attending: Vascular Surgery | Admitting: Vascular Surgery

## 2021-12-01 DIAGNOSIS — E1151 Type 2 diabetes mellitus with diabetic peripheral angiopathy without gangrene: Secondary | ICD-10-CM | POA: Diagnosis present

## 2021-12-01 DIAGNOSIS — I70212 Atherosclerosis of native arteries of extremities with intermittent claudication, left leg: Secondary | ICD-10-CM | POA: Insufficient documentation

## 2021-12-01 DIAGNOSIS — Z87891 Personal history of nicotine dependence: Secondary | ICD-10-CM | POA: Insufficient documentation

## 2021-12-01 DIAGNOSIS — I10 Essential (primary) hypertension: Secondary | ICD-10-CM | POA: Insufficient documentation

## 2021-12-01 DIAGNOSIS — I70213 Atherosclerosis of native arteries of extremities with intermittent claudication, bilateral legs: Secondary | ICD-10-CM

## 2021-12-01 DIAGNOSIS — Z7984 Long term (current) use of oral hypoglycemic drugs: Secondary | ICD-10-CM | POA: Insufficient documentation

## 2021-12-01 DIAGNOSIS — M25569 Pain in unspecified knee: Secondary | ICD-10-CM

## 2021-12-01 DIAGNOSIS — E785 Hyperlipidemia, unspecified: Secondary | ICD-10-CM | POA: Diagnosis not present

## 2021-12-01 DIAGNOSIS — I2510 Atherosclerotic heart disease of native coronary artery without angina pectoris: Secondary | ICD-10-CM | POA: Insufficient documentation

## 2021-12-01 DIAGNOSIS — I739 Peripheral vascular disease, unspecified: Secondary | ICD-10-CM

## 2021-12-01 HISTORY — PX: ABDOMINAL AORTOGRAM W/LOWER EXTREMITY: CATH118223

## 2021-12-01 HISTORY — PX: PERIPHERAL VASCULAR INTERVENTION: CATH118257

## 2021-12-01 LAB — POCT I-STAT, CHEM 8
BUN: 19 mg/dL (ref 8–23)
Calcium, Ion: 1.15 mmol/L (ref 1.15–1.40)
Chloride: 101 mmol/L (ref 98–111)
Creatinine, Ser: 0.9 mg/dL (ref 0.61–1.24)
Glucose, Bld: 172 mg/dL — ABNORMAL HIGH (ref 70–99)
HCT: 44 % (ref 39.0–52.0)
Hemoglobin: 15 g/dL (ref 13.0–17.0)
Potassium: 4 mmol/L (ref 3.5–5.1)
Sodium: 142 mmol/L (ref 135–145)
TCO2: 30 mmol/L (ref 22–32)

## 2021-12-01 SURGERY — ABDOMINAL AORTOGRAM W/LOWER EXTREMITY
Anesthesia: LOCAL | Laterality: Left

## 2021-12-01 MED ORDER — HYDRALAZINE HCL 20 MG/ML IJ SOLN: 5.0000 mg | INTRAMUSCULAR | Status: DC | PRN

## 2021-12-01 MED ORDER — ONDANSETRON HCL 4 MG/2ML IJ SOLN: 4.0000 mg | Freq: Four times a day (QID) | INTRAMUSCULAR | Status: DC | PRN

## 2021-12-01 MED ORDER — SODIUM CHLORIDE 0.9 % IV SOLN
INTRAVENOUS | Status: DC
Start: 2021-12-01 — End: 2021-12-01

## 2021-12-01 MED ORDER — HEPARIN (PORCINE) IN NACL 1000-0.9 UT/500ML-% IV SOLN
INTRAVENOUS | Status: DC | PRN
  Administered 2021-12-01 (×2): 500 mL

## 2021-12-01 MED ORDER — SODIUM CHLORIDE 0.9 % IV SOLN: INTRAVENOUS | Status: DC

## 2021-12-01 MED ORDER — SODIUM CHLORIDE 0.9% FLUSH
3.0000 mL | Freq: Two times a day (BID) | INTRAVENOUS | Status: DC
Start: 2021-12-01 — End: 2021-12-01

## 2021-12-01 MED ORDER — MIDAZOLAM HCL 2 MG/2ML IJ SOLN
INTRAMUSCULAR | Status: AC
  Filled 2021-12-01: qty 2

## 2021-12-01 MED ORDER — FENTANYL CITRATE (PF) 100 MCG/2ML IJ SOLN
INTRAMUSCULAR | Status: AC
  Filled 2021-12-01: qty 2

## 2021-12-01 MED ORDER — HEPARIN (PORCINE) IN NACL 1000-0.9 UT/500ML-% IV SOLN
INTRAVENOUS | Status: AC
Start: 2021-12-01 — End: ?
  Filled 2021-12-01: qty 1000

## 2021-12-01 MED ORDER — LIDOCAINE HCL (PF) 1 % IJ SOLN
INTRAMUSCULAR | Status: DC | PRN
  Administered 2021-12-01: 12 mL

## 2021-12-01 MED ORDER — LABETALOL HCL 5 MG/ML IV SOLN: 10.0000 mg | INTRAVENOUS | Status: DC | PRN

## 2021-12-01 MED ORDER — IODIXANOL 320 MG/ML IV SOLN
INTRAVENOUS | Status: DC | PRN
  Administered 2021-12-01: 120 mL

## 2021-12-01 MED ORDER — CLOPIDOGREL BISULFATE 75 MG PO TABS
ORAL_TABLET | ORAL | Status: DC | PRN
  Administered 2021-12-01: 75 mg via ORAL

## 2021-12-01 MED ORDER — FENTANYL CITRATE (PF) 100 MCG/2ML IJ SOLN
INTRAMUSCULAR | Status: DC | PRN
  Administered 2021-12-01 (×2): 25 ug via INTRAVENOUS

## 2021-12-01 MED ORDER — ACETAMINOPHEN 325 MG PO TABS: 650.0000 mg | ORAL_TABLET | ORAL | Status: DC | PRN

## 2021-12-01 MED ORDER — LIDOCAINE HCL (PF) 1 % IJ SOLN
INTRAMUSCULAR | Status: AC
  Filled 2021-12-01: qty 30

## 2021-12-01 MED ORDER — CLOPIDOGREL BISULFATE 75 MG PO TABS
ORAL_TABLET | ORAL | Status: AC
  Filled 2021-12-01: qty 1

## 2021-12-01 MED ORDER — ASPIRIN 81 MG PO CHEW
CHEWABLE_TABLET | ORAL | Status: AC
  Filled 2021-12-01: qty 1

## 2021-12-01 MED ORDER — HEPARIN SODIUM (PORCINE) 1000 UNIT/ML IJ SOLN
INTRAMUSCULAR | Status: DC | PRN
  Administered 2021-12-01: 10000 [IU] via INTRAVENOUS

## 2021-12-01 MED ORDER — SODIUM CHLORIDE 0.9% FLUSH: 3.0000 mL | INTRAVENOUS | Status: DC | PRN

## 2021-12-01 MED ORDER — HEPARIN SODIUM (PORCINE) 1000 UNIT/ML IJ SOLN
INTRAMUSCULAR | Status: AC
Start: 2021-12-01 — End: ?
  Filled 2021-12-01: qty 10

## 2021-12-01 MED ORDER — SODIUM CHLORIDE 0.9 % IV SOLN: 250.0000 mL | INTRAVENOUS | Status: DC | PRN

## 2021-12-01 MED ORDER — ASPIRIN 81 MG PO CHEW
CHEWABLE_TABLET | ORAL | Status: DC | PRN
  Administered 2021-12-01: 81 mg via ORAL

## 2021-12-01 MED ORDER — MIDAZOLAM HCL 2 MG/2ML IJ SOLN
INTRAMUSCULAR | Status: DC | PRN
  Administered 2021-12-01 (×2): 1 mg via INTRAVENOUS

## 2021-12-01 SURGICAL SUPPLY — 21 items
BALLN MUSTANG 5.0X40 135 (BALLOONS) ×2
BALLN MUSTANG 5X100X135 (BALLOONS) ×2
BALLOON MUSTANG 5.0X40 135 (BALLOONS) IMPLANT
BALLOON MUSTANG 5X100X135 (BALLOONS) IMPLANT
CATH OMNI FLUSH 5F 65CM (CATHETERS) ×1 IMPLANT
CATH QUICKCROSS SUPP .035X90CM (MICROCATHETER) ×1 IMPLANT
DEVICE CLOSURE MYNXGRIP 6/7F (Vascular Products) ×1 IMPLANT
GLIDEWIRE ADV .035X260CM (WIRE) ×1 IMPLANT
KIT ENCORE 26 ADVANTAGE (KITS) ×1 IMPLANT
KIT MICROPUNCTURE NIT STIFF (SHEATH) ×1 IMPLANT
KIT PV (KITS) ×2 IMPLANT
SHEATH FLEX ANSEL ST 6FR 45CM (SHEATH) ×2 IMPLANT
SHEATH PINNACLE 5F 10CM (SHEATH) ×1 IMPLANT
SHEATH PINNACLE 6F 10CM (SHEATH) ×1 IMPLANT
SHEATH PROBE COVER 6X72 (BAG) ×1 IMPLANT
STENT ELUVIA 6X100X130 (Permanent Stent) ×1 IMPLANT
STENT ELUVIA 6X40X130 (Permanent Stent) ×1 IMPLANT
SYR MEDRAD MARK V 150ML (SYRINGE) ×1 IMPLANT
TRANSDUCER W/STOPCOCK (MISCELLANEOUS) ×2 IMPLANT
TRAY PV CATH (CUSTOM PROCEDURE TRAY) ×2 IMPLANT
WIRE BENTSON .035X145CM (WIRE) ×1 IMPLANT

## 2021-12-01 NOTE — Progress Notes (Signed)
Patient and sister was given discharge instructions. Both verbalized understanding. 

## 2021-12-01 NOTE — H&P (Signed)
History and Physical Interval Note:  12/01/2021 8:43 AM  Reginald Little  has presented today for surgery, with the diagnosis of lifestyle limiting claudication.  The various methods of treatment have been discussed with the patient and family. After consideration of risks, benefits and other options for treatment, the patient has consented to  Procedure(s): ABDOMINAL AORTOGRAM W/LOWER EXTREMITY (N/A) as a surgical intervention.  The patient's history has been reviewed, patient examined, no change in status, stable for surgery.  I have reviewed the patient's chart and labs.  Questions were answered to the patient's satisfaction.     Marty Heck  Patient name: Reginald Little      MRN: [id number]        DOB: 1950-11-27          Sex: male   REASON FOR CONSULT: Left lower extremity claudication   HPI: Reginald Little is a 71 y.o. male, with history of hypertension, hyperlipidemia, diabetes, coronary artery disease status post PCI as well as previous left carotid endarterectomy that presents for evaluation of left lower extremity claudication.  Patient states he gets severe cramping in his left calf and thigh that has been ongoing for the last 5 years.  This has been managed medically.  He states now he is very disabled by this.  He can no longer walk his dog.  He cannot mow his grass.  He cannot take a walk on the beach.  Ultimately he has to stop for about 15 minutes before resolved after short walks.  He was evaluated at the [facility] and ultimately sent for a CTA by Dr. Nicola Girt.  This showed moderate stenosis in the left common iliac artery as well as moderate stenosis in the left SFA proximally as well as distally at adductor canal.  He has two-vessel runoff in the peroneal and posterior tibial arteries.  No previous left leg interventions.  States his quality life is terrible because he cannot use his left leg.  Wants to exercise and lose weight.       Past Medical History:  Diagnosis Date   Atypical chest  pain     Carotid artery disease ([place])      a. s/p L CEA   Coronary artery disease      a. s/p DES x 2 to the RCA (05/2012 at [facility]) b. 05/2014 Abnl MV w/ inf/inflat partially rev defect;  c. s/p DES of OM2 and RPL1 (05/2014)  d. s/p LHC on 10/06/14 with patent stents and non-obs CAD; e. 06/2016 MV: low risk, EF 62%, fixed medium-sized, mild basal and inf defect - likely attenuation. No ischemia.   Diabetes mellitus without complication (HCC)     GERD (gastroesophageal reflux disease)     HLD (hyperlipidemia)     Hypertension     Obesity     Syncope      a. 10/2014 - felt to be 2/2 hypotension after recent imdur titration.           Past Surgical History:  Procedure Laterality Date   CAROTID ENDARTERECTOMY       CORONARY ANGIOPLASTY WITH STENT PLACEMENT       LEFT HEART CATHETERIZATION WITH CORONARY ANGIOGRAM N/A 06/08/2014    Procedure: LEFT HEART CATHETERIZATION WITH CORONARY ANGIOGRAM;  Surgeon: Blane Ohara, MD;  Location: [facility] CATH LAB;  Service: Cardiovascular;  Laterality: N/A;   LEFT HEART CATHETERIZATION WITH CORONARY ANGIOGRAM N/A 10/06/2014    Procedure: LEFT HEART CATHETERIZATION WITH CORONARY ANGIOGRAM;  Surgeon: Leonie Man, MD;  Location: [place] CATH LAB;  Service: Cardiovascular;  Laterality: N/A;           Family History  Problem Relation Age of Onset   Heart attack Father 75   COPD Mother 15      SOCIAL HISTORY: Social History         Socioeconomic History   Marital status: Divorced      Spouse name: Not on file   Number of children: Not on file   Years of education: Not on file   Highest education level: Not on file  Occupational History   Occupation: Owns transportation brokering business  Tobacco Use   Smoking status: Former      Types: Cigarettes      Quit date: 06/05/2012      Years since quitting: 9.4   Smokeless tobacco: Never  Vaping Use   Vaping Use: Never used  Substance and Sexual Activity   Alcohol use: No   Drug use: No   Sexual  activity: Not on file  Other Topics Concern   Not on file  Social History Narrative    Lives in [place] by himself.  Family and friends nearby.  He is a Psychologist, clinical.      Social Determinants of Health    Financial Resource Strain: Not on file  Food Insecurity: Not on file  Transportation Needs: Not on file  Physical Activity: Not on file  Stress: Not on file  Social Connections: Not on file  Intimate Partner Violence: Not on file           Allergies  Allergen Reactions   Penicillins Rash      Has patient had a PCN reaction causing immediate rash, facial/tongue/throat swelling, SOB or lightheadedness with hypotension: Yes Has patient had a PCN reaction causing severe rash involving mucus membranes or skin necrosis: No Has patient had a PCN reaction that required hospitalization: No Has patient had a PCN reaction occurring within the last 10 years: No If all of the above answers are "NO", then may proceed with Cephalosporin use.              Current Outpatient Medications  Medication Sig Dispense Refill   aspirin EC 81 MG tablet Take 81 mg by mouth daily.       atenolol (TENORMIN) 50 MG tablet Take 1 tablet (50 mg total) by mouth daily. 90 tablet 1   Cholecalciferol (VITAMIN D3) 1000 units CAPS Take 1,000 Units by mouth daily.       clopidogrel (PLAVIX) 75 MG tablet TAKE 1 TABLET EVERY DAY (NEED MD APPOINTMENT FOR REFILLS) 90 tablet 1   Coenzyme Q10 (CO Q-10) 300 MG CAPS Take 300 mg by mouth daily.       furosemide (LASIX) 20 MG tablet TAKE 1 TABLET EVERY DAY (Patient taking differently: Take 20 mg by mouth daily.) 90 tablet 1   glipiZIDE (GLUCOTROL XL) 10 MG 24 hr tablet Take 10 mg by mouth daily.       isosorbide mononitrate (IMDUR) 60 MG 24 hr tablet Take 2 tablets (120 mg total) by mouth daily. 180 tablet 3   nitroGLYCERIN (NITROSTAT) 0.4 MG SL tablet Place 1 tablet (0.4 mg total) every 5 (five) minutes x 3 doses as needed under the tongue for chest pain. 90 tablet 3   pantoprazole  (PROTONIX) 40 MG tablet TAKE 1 TABLET DAILY IF NEEDED (Patient taking differently: Take 40 mg by mouth daily.) 90 tablet 3   rosuvastatin (CRESTOR) 20 MG tablet TAKE  1 TABLET EVERY DAY (Patient taking differently: Take 20 mg by mouth at bedtime.) 90 tablet 0   doxycycline (VIBRAMYCIN) 100 MG capsule Take 1 capsule (100 mg total) by mouth 2 (two) times daily. 20 capsule 0   metFORMIN (GLUCOPHAGE-XR) 500 MG 24 hr tablet Take 500 mg by mouth at bedtime.       ondansetron (ZOFRAN ODT) 4 MG disintegrating tablet Take 1 tablet (4 mg total) by mouth every 8 (eight) hours as needed for nausea or vomiting. 20 tablet 0   tamsulosin (FLOMAX) 0.4 MG CAPS capsule Take 0.4 mg by mouth at bedtime.        No current facility-administered medications for this visit.      REVIEW OF SYSTEMS:  '[X]'$  denotes positive finding, '[ ]'$  denotes negative finding Cardiac   Comments:  Chest pain or chest pressure:      Shortness of breath upon exertion:      Short of breath when lying flat:      Irregular heart rhythm:             Vascular      Pain in calf, thigh, or hip brought on by ambulation: x Left leg  Pain in feet at night that wakes you up from your sleep:       Blood clot in your veins:      Leg swelling:              Pulmonary      Oxygen at home:      Productive cough:       Wheezing:              Neurologic      Sudden weakness in arms or legs:       Sudden numbness in arms or legs:       Sudden onset of difficulty speaking or slurred speech:      Temporary loss of vision in one eye:       Problems with dizziness:              Gastrointestinal      Blood in stool:       Vomited blood:              Genitourinary      Burning when urinating:       Blood in urine:             Psychiatric      Major depression:              Hematologic      Bleeding problems:      Problems with blood clotting too easily:             Skin      Rashes or ulcers:             Constitutional      Fever or  chills:          PHYSICAL EXAM:    Vitals:    11/15/21 1344  BP: (!) 156/92  Pulse: 72  Resp: 18  Temp: 97.7 F (36.5 C)  TempSrc: Temporal  SpO2: 95%  Weight: 218 lb (98.9 kg)  Height: 5' 8.5" (1.74 m)      GENERAL: The patient is a well-nourished male, in no acute distress. The vital signs are documented above. CARDIAC: There is a regular rate and rhythm.  VASCULAR:  Palpable femoral pulses bilaterally No lower extremity tissue loss PULMONARY: No respiratory  distress. ABDOMEN: Soft and non-tender. MUSCULOSKELETAL: There are no major deformities or cyanosis. NEUROLOGIC: No focal weakness or paresthesias are detected. SKIN: There are no ulcers or rashes noted. PSYCHIATRIC: The patient has a normal affect.   DATA:    CTA scan reviewed from 09/01/2021 with a focus on the left leg shows a moderate stenosis in the left common iliac artery as well as a moderate stenosis in the proximal SFA and distal SFA at adductor canal as well as two-vessel runoff in the peroneal and posterior tibial arteries.   Assessment/Plan:   71 year old male with lifestyle-limiting claudication in the left lower extremity with evidence of multilevel disease on his CTA.  I discussed typically managing claudication with medical management and not intervention.  He states he has already tried conservative management for the last 5 years and his symptoms have worsened.  Now really unable to walk his dog or take care of his lawn etc.  In review of the CTA at the [place], looks like he has a moderate stenosis in his left common iliac artery as well as moderate stenosis in the proximal and distal SFA on the left.  I have recommended aortogram with lower extremity arteriogram with possible intervention with a focus on the left leg.  Discussed this being done in the Cath Lab under moderate sedation.  Discussed risk and benefits.     Marty Heck, MD [facility] Office:  [phone]

## 2021-12-26 ENCOUNTER — Other Ambulatory Visit: Payer: Self-pay | Admitting: *Deleted

## 2021-12-26 DIAGNOSIS — L821 Other seborrheic keratosis: Secondary | ICD-10-CM | POA: Diagnosis not present

## 2021-12-26 DIAGNOSIS — Z85828 Personal history of other malignant neoplasm of skin: Secondary | ICD-10-CM | POA: Diagnosis not present

## 2021-12-26 DIAGNOSIS — L820 Inflamed seborrheic keratosis: Secondary | ICD-10-CM | POA: Diagnosis not present

## 2021-12-26 DIAGNOSIS — I70212 Atherosclerosis of native arteries of extremities with intermittent claudication, left leg: Secondary | ICD-10-CM

## 2021-12-26 DIAGNOSIS — L814 Other melanin hyperpigmentation: Secondary | ICD-10-CM | POA: Diagnosis not present

## 2021-12-26 DIAGNOSIS — L570 Actinic keratosis: Secondary | ICD-10-CM | POA: Diagnosis not present

## 2021-12-29 DIAGNOSIS — E7800 Pure hypercholesterolemia, unspecified: Secondary | ICD-10-CM | POA: Diagnosis not present

## 2021-12-29 DIAGNOSIS — E1151 Type 2 diabetes mellitus with diabetic peripheral angiopathy without gangrene: Secondary | ICD-10-CM | POA: Diagnosis not present

## 2021-12-29 DIAGNOSIS — I209 Angina pectoris, unspecified: Secondary | ICD-10-CM | POA: Diagnosis not present

## 2021-12-29 DIAGNOSIS — K219 Gastro-esophageal reflux disease without esophagitis: Secondary | ICD-10-CM | POA: Diagnosis not present

## 2021-12-29 DIAGNOSIS — I10 Essential (primary) hypertension: Secondary | ICD-10-CM | POA: Diagnosis not present

## 2022-01-02 DIAGNOSIS — Z0000 Encounter for general adult medical examination without abnormal findings: Secondary | ICD-10-CM | POA: Diagnosis not present

## 2022-01-02 DIAGNOSIS — R300 Dysuria: Secondary | ICD-10-CM | POA: Diagnosis not present

## 2022-01-02 DIAGNOSIS — I700 Atherosclerosis of aorta: Secondary | ICD-10-CM | POA: Diagnosis not present

## 2022-01-02 DIAGNOSIS — Z79899 Other long term (current) drug therapy: Secondary | ICD-10-CM | POA: Diagnosis not present

## 2022-01-02 DIAGNOSIS — R911 Solitary pulmonary nodule: Secondary | ICD-10-CM | POA: Diagnosis not present

## 2022-01-02 DIAGNOSIS — E7800 Pure hypercholesterolemia, unspecified: Secondary | ICD-10-CM | POA: Diagnosis not present

## 2022-01-02 DIAGNOSIS — K219 Gastro-esophageal reflux disease without esophagitis: Secondary | ICD-10-CM | POA: Diagnosis not present

## 2022-01-02 DIAGNOSIS — E1169 Type 2 diabetes mellitus with other specified complication: Secondary | ICD-10-CM | POA: Diagnosis not present

## 2022-01-02 DIAGNOSIS — E1151 Type 2 diabetes mellitus with diabetic peripheral angiopathy without gangrene: Secondary | ICD-10-CM | POA: Diagnosis not present

## 2022-01-11 ENCOUNTER — Other Ambulatory Visit: Payer: Self-pay | Admitting: Geriatric Medicine

## 2022-01-11 DIAGNOSIS — R911 Solitary pulmonary nodule: Secondary | ICD-10-CM

## 2022-01-12 NOTE — Progress Notes (Signed)
Office Note     CC:  follow up Requesting Provider:  Lajean Manes, MD  HPI: Reginald Little is a 71 y.o. (1951-03-14) male who presents for follow up of PAD. He is s/p Angiogram on 12/02/21. He underwent Left lower extremity arteriogram with Left SFA and above-knee popliteal angioplasty with stent placement (6 mm x 40 mm drug-coated Eluvia in the proximal SFA postdilated with a 5 mm Mustang and 6 mm x 100 mm drug-coated Eluvia in the distal SFA/above-knee popliteal artery postdilated with a 5 mm Mustang) by Dr. Carlis Abbott. This was performed for lifestyle limiting claudication. The aortogram findings are as follows:  Aortogram showed a patent infrarenal aorta and patent bilateral renal arteries that were poorly visualized. The left common iliac had eccentric plaque that did not appear flow- limiting and only about 30-40% stenosis. The remainder of the left external iliac, common femoral, and profunda are patent without flow limiting stenosis. He had two significant lesions in the left SFA. A short focal 50% stenosis in the proximal SFA and a second 80% stenosis in the distal SFA/ above knee popliteal artery adjacent to the Hunter's canal. Distally the popliteal artery was patent with two vessel runoff in the posterior tibial and peroneal artery. Anterior tibial artery is occluded.   He reports today that his leg feels great. He denies any pain in his legs when walking, rest pain, or wounds of the lower extremities.  He states 2 weeks ago he joined an exercise program at [employer].  He goes walking around the track 2 to 3 days a week and is happy about being able to walk more without any pain.  He states his only issue was that he started developing some left hip pain when walking the other day.  He does not feel any hip pain at rest.  He has not reached out to his orthopedic doctor yet.  The pt is on a statin for cholesterol management.  The pt is on a daily aspirin.   Other AC:  Plavix The pt is on BB for  hypertension.   The pt is diabetic.   Tobacco hx:  Former, quit 2013  Past Medical History:  Diagnosis Date   Atypical chest pain    Carotid artery disease ([place])    a. s/p L CEA   Coronary artery disease    a. s/p DES x 2 to the RCA (05/2012 at [facility]) b. 05/2014 Abnl MV w/ inf/inflat partially rev defect;  c. s/p DES of OM2 and RPL1 (05/2014)  d. s/p LHC on 10/06/14 with patent stents and non-obs CAD; e. 06/2016 MV: low risk, EF 62%, fixed medium-sized, mild basal and inf defect - likely attenuation. No ischemia.   Diabetes mellitus without complication (HCC)    GERD (gastroesophageal reflux disease)    HLD (hyperlipidemia)    Hypertension    Obesity    Syncope    a. 10/2014 - felt to be 2/2 hypotension after recent imdur titration.    Past Surgical History:  Procedure Laterality Date   ABDOMINAL AORTOGRAM W/LOWER EXTREMITY Left 12/01/2021   Procedure: ABDOMINAL AORTOGRAM W/LOWER EXTREMITY;  Surgeon: Marty Heck, MD;  Location: [place] CV LAB;  Service: Cardiovascular;  Laterality: Left;   CAROTID ENDARTERECTOMY     CORONARY ANGIOPLASTY WITH STENT PLACEMENT     LEFT HEART CATHETERIZATION WITH CORONARY ANGIOGRAM N/A 06/08/2014   Procedure: LEFT HEART CATHETERIZATION WITH CORONARY ANGIOGRAM;  Surgeon: Blane Ohara, MD;  Location: [facility] CATH LAB;  Service: Cardiovascular;  Laterality: N/A;   LEFT HEART CATHETERIZATION WITH CORONARY ANGIOGRAM N/A 10/06/2014   Procedure: LEFT HEART CATHETERIZATION WITH CORONARY ANGIOGRAM;  Surgeon: Leonie Man, MD;  Location: [facility] CATH LAB;  Service: Cardiovascular;  Laterality: N/A;   PERIPHERAL VASCULAR INTERVENTION Left 12/01/2021   Procedure: PERIPHERAL VASCULAR INTERVENTION;  Surgeon: Marty Heck, MD;  Location: [place] CV LAB;  Service: Cardiovascular;  Laterality: Left;  SFA    Social History   Socioeconomic History   Marital status: Divorced    Spouse name: Not on file   Number of children: Not on file   Years of  education: Not on file   Highest education level: Not on file  Occupational History   Occupation: Owns transportation brokering business  Tobacco Use   Smoking status: Former    Types: Cigarettes    Quit date: 06/05/2012    Years since quitting: 9.6   Smokeless tobacco: Never  Vaping Use   Vaping Use: Never used  Substance and Sexual Activity   Alcohol use: No   Drug use: No   Sexual activity: Not on file  Other Topics Concern   Not on file  Social History Narrative   Lives in [place] by himself.  Family and friends nearby.  He is a Psychologist, clinical.     Social Determinants of Health   Financial Resource Strain: Not on file  Food Insecurity: Not on file  Transportation Needs: Not on file  Physical Activity: Not on file  Stress: Not on file  Social Connections: Not on file  Intimate Partner Violence: Not on file    Family History  Problem Relation Age of Onset   Heart attack Father 29   COPD Mother 33    Current Outpatient Medications  Medication Sig Dispense Refill   aspirin EC 81 MG tablet Take 81 mg by mouth daily.     atenolol (TENORMIN) 50 MG tablet Take 1 tablet (50 mg total) by mouth daily. 90 tablet 1   CALCIUM PO Take 1 tablet by mouth daily.     canagliflozin (INVOKANA) 300 MG TABS tablet Take 300 mg by mouth daily before breakfast.     cholecalciferol (VITAMIN D) 25 MCG (1000 UNIT) tablet Take 1,000 Units by mouth daily.     clopidogrel (PLAVIX) 75 MG tablet TAKE 1 TABLET EVERY DAY (NEED MD APPOINTMENT FOR REFILLS) 90 tablet 1   furosemide (LASIX) 20 MG tablet TAKE 1 TABLET EVERY DAY 90 tablet 1   glipiZIDE (GLUCOTROL XL) 10 MG 24 hr tablet Take 10 mg by mouth daily.     isosorbide mononitrate (IMDUR) 60 MG 24 hr tablet Take 2 tablets (120 mg total) by mouth daily. 180 tablet 3   latanoprost (XALATAN) 0.005 % ophthalmic solution Place 1 drop into both eyes at bedtime.     Multiple Vitamin (MULTIVITAMIN WITH MINERALS) TABS tablet Take 1 tablet by mouth daily.      nitroGLYCERIN (NITROSTAT) 0.4 MG SL tablet Place 1 tablet (0.4 mg total) every 5 (five) minutes x 3 doses as needed under the tongue for chest pain. 90 tablet 3   Omega-3 Fatty Acids (OMEGA 3 PO) Take 3 capsules by mouth in the morning. OmegaXL Joint Pain Relief & Inflammation Supplement     pantoprazole (PROTONIX) 40 MG tablet TAKE 1 TABLET DAILY IF NEEDED (Patient taking differently: Take 40 mg by mouth daily before breakfast.) 90 tablet 3   rosuvastatin (CRESTOR) 20 MG tablet TAKE 1 TABLET EVERY DAY (Patient taking differently: Take 20 mg  by mouth in the morning.) 90 tablet 0   VITAMIN E PO Take 1 capsule by mouth in the morning.     No current facility-administered medications for this visit.    Allergies  Allergen Reactions   Jardiance [Empagliflozin]     Other reaction(s): urine pain   Lescol [Fluvastatin] Other (See Comments)   Lipitor [Atorvastatin]     Other reaction(s): Muscle pain   Metformin Hcl     Other reaction(s): stomach upset   Other     Anesthesia-temporary paralysis    Zocor [Simvastatin]     Other reaction(s): Muscle pain   Penicillins Rash    Has patient had a PCN reaction causing immediate rash, facial/tongue/throat swelling, SOB or lightheadedness with hypotension: Yes Has patient had a PCN reaction causing severe rash involving mucus membranes or skin necrosis: No Has patient had a PCN reaction that required hospitalization: No Has patient had a PCN reaction occurring within the last 10 years: No If all of the above answers are "NO", then may proceed with Cephalosporin use.      REVIEW OF SYSTEMS:   '[X]'$  denotes positive finding, '[ ]'$  denotes negative finding Cardiac  Comments:  Chest pain or chest pressure:    Shortness of breath upon exertion:    Short of breath when lying flat:    Irregular heart rhythm:        Vascular    Pain in calf, thigh, or hip brought on by ambulation: x   Pain in feet at night that wakes you up from your sleep:     Blood  clot in your veins:    Leg swelling:         Pulmonary    Oxygen at home:    Productive cough:     Wheezing:         Neurologic    Sudden weakness in arms or legs:     Sudden numbness in arms or legs:     Sudden onset of difficulty speaking or slurred speech:    Temporary loss of vision in one eye:     Problems with dizziness:         Gastrointestinal    Blood in stool:     Vomited blood:         Genitourinary    Burning when urinating:     Blood in urine:        Psychiatric    Major depression:         Hematologic    Bleeding problems:    Problems with blood clotting too easily:        Skin    Rashes or ulcers:        Constitutional    Fever or chills:      PHYSICAL EXAMINATION:  There were no vitals filed for this visit.  General:  WDWN in NAD; vital signs documented above Gait: Not observed HENT: WNL, normocephalic Pulmonary: normal non-labored breathing , without Rales, rhonchi,  wheezing Cardiac: regular HR, without murmurs without carotid bruit Abdomen: soft, NT, no masses Skin: without rashes Vascular Exam/Pulses: Pedal pulses are not palpable bilaterally, but feet are warm and well perfused with brisk capillary refill Extremities: without ischemic changes, without Gangrene , without cellulitis; without open wounds;  Musculoskeletal: no muscle wasting or atrophy  Neurologic: A&O X 3;  No focal weakness or paresthesias are detected Psychiatric:  The pt has Normal affect.   Non-Invasive Vascular Imaging:   LLE Arterial Duplex Left proximal  SFA Stent(s):  +---------------+---++--------++  Prox to Stent  113biphasic  +---------------+---++--------++  Proximal Stent 122biphasic  +---------------+---++--------++  Mid Stent      79 biphasic  +---------------+---++--------++  Distal Stent   97 biphasic  +---------------+---++--------++  Distal to Stent99 biphasic  +---------------+---++--------++      +---------------------+--------+-----+--------+--------+--------+  LEFT distal SFA stentPSV cm/sRatioStenosisWaveformComments  +---------------------+--------+-----+--------+--------+--------+  CFA Prox             152                  biphasic          +---------------------+--------+-----+--------+--------+--------+  CFA Mid              128                  biphasic          +---------------------+--------+-----+--------+--------+--------+  DFA                  144                  biphasicstent     +---------------------+--------+-----+--------+--------+--------+  SFA Prox             126                  biphasicstent     +---------------------+--------+-----+--------+--------+--------+  SFA Mid              128                                    +---------------------+--------+-----+--------+--------+--------+  POP Mid              114                  biphasic          +---------------------+--------+-----+--------+--------+--------+      Left Stent(s):  +---------------+---++--------++  Prox to Stent  139biphasic  +---------------+---++--------++  Proximal Stent 163biphasic  +---------------+---++--------++  Mid Stent      114biphasic  +---------------+---++--------++  Distal Stent   129biphasic  +---------------+---++--------++  Distal to Stent64 biphasic   ABI  +---------+------------------+-----+--------+--------+  Right    Rt Pressure (mmHg)IndexWaveformComment   +---------+------------------+-----+--------+--------+  Brachial 126                                      +---------+------------------+-----+--------+--------+  PTA      176               1.40 biphasic          +---------+------------------+-----+--------+--------+  DP       124               0.98 biphasic          +---------+------------------+-----+--------+--------+  Great Toe119                0.94 Normal            +---------+------------------+-----+--------+--------+   +---------+------------------+-----+--------+-------+  Left     Lt Pressure (mmHg)IndexWaveformComment  +---------+------------------+-----+--------+-------+  Brachial 112                                     +---------+------------------+-----+--------+-------+  PTA      168  1.33 biphasic         +---------+------------------+-----+--------+-------+  DP       163               1.29 biphasic         +---------+------------------+-----+--------+-------+  Great Toe115               0.91 Normal           +---------+------------------+-----+--------+-------+   +-------+-----------+-----------+------------+------------+  ABI/TBIToday's ABIToday's TBIPrevious ABIPrevious TBI  +-------+-----------+-----------+------------+------------+  Right  1.40       0.94       1.09        0.86          +-------+-----------+-----------+------------+------------+  Left   1.33       0.91       0.82        0.81          +-------+-----------+-----------+------------+------------+   ASSESSMENT/PLAN:: 71 y.o. male here for follow up for PAD.  He is s/p Angiogram on 12/02/21. He underwent Left lower extremity arteriogram with Left SFA and above-knee popliteal angioplasty with stent placement (6 mm x 40 mm drug-coated Eluvia in the proximal SFA postdilated with a 5 mm Mustang and 6 mm x 100 mm drug-coated Eluvia in the distal SFA/above-knee popliteal artery postdilated with a 5 mm Mustang) by Dr. Carlis Abbott. This was performed for lifestyle limiting claudication.   -Duplex today reveals patent stents in the LLE with biphasic flow. ABIs in the LLE have increased significantly from 0.82 to 1.33 since the procedure -The patient is no longer experiencing any claudication and is happy about the results of the procedure -Continue Aspirin, Statin and Plavix -The patient was reassured  that his L hip pain does not come from a vascular standpoint, and he was encouraged to follow up with his established orthopedic doctor in regard to the hip pain -He will follow up in 6 months with ABI and LLE arterial duplex study   Vicente Serene, PA-C [facility] [phone]  Clinic MD:   Roxanne Mins

## 2022-01-17 ENCOUNTER — Ambulatory Visit (INDEPENDENT_AMBULATORY_CARE_PROVIDER_SITE_OTHER)
Admission: RE | Admit: 2022-01-17 | Discharge: 2022-01-17 | Disposition: A | Discharge status: Home / Self Care | Payer: No Typology Code available for payment source | Source: Ambulatory Visit | Attending: Vascular Surgery | Admitting: Vascular Surgery

## 2022-01-17 ENCOUNTER — Ambulatory Visit (INDEPENDENT_AMBULATORY_CARE_PROVIDER_SITE_OTHER): Payer: No Typology Code available for payment source | Admitting: Physician Assistant

## 2022-01-17 ENCOUNTER — Ambulatory Visit (HOSPITAL_COMMUNITY)
Admission: RE | Admit: 2022-01-17 | Discharge: 2022-01-17 | Disposition: A | Discharge status: Home / Self Care | Payer: No Typology Code available for payment source | Source: Ambulatory Visit | Attending: Vascular Surgery | Admitting: Vascular Surgery

## 2022-01-17 ENCOUNTER — Encounter: Payer: Self-pay | Admitting: Physician Assistant

## 2022-01-17 VITALS — BP 142/78 | HR 82 | Temp 97.2°F | Resp 20 | Ht 68.5 in | Wt 221.5 lb

## 2022-01-17 DIAGNOSIS — I70212 Atherosclerosis of native arteries of extremities with intermittent claudication, left leg: Secondary | ICD-10-CM

## 2022-01-20 ENCOUNTER — Other Ambulatory Visit: Payer: Self-pay

## 2022-01-20 DIAGNOSIS — I70212 Atherosclerosis of native arteries of extremities with intermittent claudication, left leg: Secondary | ICD-10-CM

## 2022-01-20 DIAGNOSIS — I739 Peripheral vascular disease, unspecified: Secondary | ICD-10-CM

## 2022-02-07 ENCOUNTER — Ambulatory Visit
Admission: RE | Admit: 2022-02-07 | Discharge: 2022-02-07 | Disposition: A | Discharge status: Home / Self Care | Payer: Medicare HMO | Source: Ambulatory Visit | Attending: Geriatric Medicine | Admitting: Geriatric Medicine

## 2022-02-07 DIAGNOSIS — R911 Solitary pulmonary nodule: Secondary | ICD-10-CM

## 2022-02-07 DIAGNOSIS — R918 Other nonspecific abnormal finding of lung field: Secondary | ICD-10-CM | POA: Diagnosis not present

## 2022-02-07 DIAGNOSIS — I700 Atherosclerosis of aorta: Secondary | ICD-10-CM | POA: Diagnosis not present

## 2022-02-24 ENCOUNTER — Other Ambulatory Visit: Payer: Self-pay | Admitting: Cardiovascular Disease

## 2022-03-10 DIAGNOSIS — E1151 Type 2 diabetes mellitus with diabetic peripheral angiopathy without gangrene: Secondary | ICD-10-CM | POA: Diagnosis not present

## 2022-03-14 ENCOUNTER — Ambulatory Visit: Payer: No Typology Code available for payment source | Attending: Cardiology | Admitting: Physician Assistant

## 2022-03-14 ENCOUNTER — Encounter: Payer: Self-pay | Admitting: Physician Assistant

## 2022-03-14 VITALS — BP 122/68 | HR 80 | Ht 68.5 in | Wt 219.6 lb

## 2022-03-14 DIAGNOSIS — I2510 Atherosclerotic heart disease of native coronary artery without angina pectoris: Secondary | ICD-10-CM

## 2022-03-14 DIAGNOSIS — I739 Peripheral vascular disease, unspecified: Secondary | ICD-10-CM | POA: Diagnosis not present

## 2022-03-14 DIAGNOSIS — E785 Hyperlipidemia, unspecified: Secondary | ICD-10-CM | POA: Diagnosis not present

## 2022-03-14 DIAGNOSIS — I10 Essential (primary) hypertension: Secondary | ICD-10-CM

## 2022-03-14 NOTE — Progress Notes (Signed)
Cardiology Office Note:    Date:  03/16/2022   ID:  [name], DOB Feb 01, 1951, MRN [id number]  PCP:  Lajean Manes, [facility] Cardiologist:  Quay Burow, MD Cardiology APP:  Reola Mosher     Referring MD: Lajean Manes, MD   Chief Complaint  Patient presents with   Follow-up    Seen for Dr. Gwenlyn Found    History of Present Illness:    Reginald Little is a 71 y.o. male with a hx of CAD (DES x 2 to RCA in 2013, DES of OM and PLA branches 2015, LHC in 2016 w/ patent stents and nonobs CAD), carotid artery disease w/ L-CEA in [place] 2010, diet controlled DM, HTN, HLD and GERD. Last echocardiogram obtained on 06/06/2014 showed EF 14-48%, grade 1 diastolic dysfunction. His Myoview obtained on 06/07/2014 showed EF 59%, mixed pattern of reversible or nonreversible decreased myocardial perfusion involving the inferior and inferolateral wall, moderate risk stress test finding. He ended up having 2.25 x 12 mm Promus DES placed in PLA 1 and 2.5 x 12 mm Promus DES placed in OM 2 by Dr. Burt Knack on 06/08/2014. He returned in April 2016 for another cardiac catheterization which did not show significant culprit lesion to explain his symptoms.  Last Myoview in January 2018 came back low risk, EF 62%, fixed medium-sized mild basal and mid inferior perfusion defect more likely to be attenuation artifact than infarction, overall no ischemia.   Lower extremity arterial Doppler in October 2018 showed left ABI 1.01, right ABI 0.99.  Echocardiogram obtained on 01/24/2018 showed EF 55 to 60%, mildly dilated aortic root measuring 41 mm, mild LVH.  Carotid Doppler obtained on 03/08/2018 showed mild disease on the right, no significant plaque on the left side.  I last saw the patient in 2020 at which time there was some question of possible atrial fibrillation on his Kardia mobile device, however on closer examination, there were artifact rather than true A-fib.  Repeat carotid Doppler  in October 2021 showed 40 to 59% disease in the right ICA, 1 to 39% disease in the left ICA.  Patient was seen by Dr. Carlis Abbott of vascular surgery in June for evaluation of lower extremity claudication symptom.  ABI showed right ABI 1.09, left ABI 0.82.  Patient subsequently underwent lower extremity angiography on 12/01/2021 which revealed flow-limiting lesion in the left SFA treated with stent.  Postprocedure, patient was continued on aspirin and Plavix.  Patient presents today for follow-up.  He denies any recent chest pain.  His claudication symptom has improved.  On physical exam, he has noticeable but weak dorsalis pedis pulses bilaterally, it is hard to detect posterior tibial pulse.  He has no lower extremity edema, orthopnea or PND.  Past Medical History:  Diagnosis Date   Atypical chest pain    Carotid artery disease ([place])    a. s/p L CEA   Coronary artery disease    a. s/p DES x 2 to the RCA (05/2012 at [facility]) b. 05/2014 Abnl MV w/ inf/inflat partially rev defect;  c. s/p DES of OM2 and RPL1 (05/2014)  d. s/p LHC on 10/06/14 with patent stents and non-obs CAD; e. 06/2016 MV: low risk, EF 62%, fixed medium-sized, mild basal and inf defect - likely attenuation. No ischemia.   Diabetes mellitus without complication (HCC)    GERD (gastroesophageal reflux disease)    HLD (hyperlipidemia)    Hypertension    Obesity    Syncope  a. 10/2014 - felt to be 2/2 hypotension after recent imdur titration.    Past Surgical History:  Procedure Laterality Date   ABDOMINAL AORTOGRAM W/LOWER EXTREMITY Left 12/01/2021   Procedure: ABDOMINAL AORTOGRAM W/LOWER EXTREMITY;  Surgeon: Marty Heck, MD;  Location: [place] [facility];  Service: Cardiovascular;  Laterality: Left;   CAROTID ENDARTERECTOMY     CORONARY ANGIOPLASTY WITH STENT PLACEMENT     LEFT HEART CATHETERIZATION WITH CORONARY ANGIOGRAM N/A 06/08/2014   Procedure: LEFT HEART CATHETERIZATION WITH CORONARY ANGIOGRAM;  Surgeon: Blane Ohara, MD;  Location: [facility] CATH LAB;  Service: Cardiovascular;  Laterality: N/A;   LEFT HEART CATHETERIZATION WITH CORONARY ANGIOGRAM N/A 10/06/2014   Procedure: LEFT HEART CATHETERIZATION WITH CORONARY ANGIOGRAM;  Surgeon: Leonie Man, MD;  Location: [facility] CATH LAB;  Service: Cardiovascular;  Laterality: N/A;   PERIPHERAL VASCULAR INTERVENTION Left 12/01/2021   Procedure: PERIPHERAL VASCULAR INTERVENTION;  Surgeon: Marty Heck, MD;  Location: [place] [facility];  Service: Cardiovascular;  Laterality: Left;  SFA    Current Medications: Current Meds  Medication Sig   aspirin EC 81 MG tablet Take 81 mg by mouth daily.   atenolol (TENORMIN) 50 MG tablet Take 1 tablet (50 mg total) by mouth daily.   CALCIUM PO Take 1 tablet by mouth daily.   canagliflozin (INVOKANA) 300 MG TABS tablet Take 300 mg by mouth daily before breakfast.   cholecalciferol (VITAMIN D) 25 MCG (1000 UNIT) tablet Take 1,000 Units by mouth daily.   clopidogrel (PLAVIX) 75 MG tablet Take 1 tablet (75 mg total) by mouth daily.   furosemide (LASIX) 20 MG tablet TAKE 1 TABLET EVERY DAY   glipiZIDE (GLUCOTROL XL) 10 MG 24 hr tablet Take 10 mg by mouth daily.   isosorbide mononitrate (IMDUR) 60 MG 24 hr tablet TAKE 2 TABLETS EVERY DAY   latanoprost (XALATAN) 0.005 % ophthalmic solution Place 1 drop into both eyes at bedtime.   Multiple Vitamin (MULTIVITAMIN WITH MINERALS) TABS tablet Take 1 tablet by mouth daily.   nitroGLYCERIN (NITROSTAT) 0.4 MG SL tablet Place 1 tablet (0.4 mg total) every 5 (five) minutes x 3 doses as needed under the tongue for chest pain.   Omega-3 Fatty Acids (OMEGA 3 PO) Take 3 capsules by mouth in the morning. OmegaXL Joint Pain Relief & Inflammation Supplement   pantoprazole (PROTONIX) 40 MG tablet TAKE 1 TABLET DAILY IF NEEDED (Patient taking differently: Take 40 mg by mouth daily before breakfast.)   rosuvastatin (CRESTOR) 20 MG tablet TAKE 1 TABLET EVERY DAY (Patient taking differently: Take 20  mg by mouth in the morning.)   VITAMIN E PO Take 1 capsule by mouth in the morning.     Allergies:   Jardiance [empagliflozin], Lescol [fluvastatin], Lipitor [atorvastatin], Metformin hcl, Other, Zocor [simvastatin], and Penicillins   Social History   Socioeconomic History   Marital status: Divorced    Spouse name: Not on file   Number of children: Not on file   Years of education: Not on file   Highest education level: Not on file  Occupational History   Occupation: Owns transportation brokering business  Tobacco Use   Smoking status: Former    Types: Cigarettes    Quit date: 06/05/2012    Years since quitting: 9.7   Smokeless tobacco: Never  Vaping Use   Vaping Use: Never used  Substance and Sexual Activity   Alcohol use: No   Drug use: No   Sexual activity: Not on file  Other Topics  Concern   Not on file  Social History Narrative   Lives in [place] by himself.  Family and friends nearby.  He is a Psychologist, clinical.     Social Determinants of Health   Financial Resource Strain: Not on file  Food Insecurity: Not on file  Transportation Needs: Not on file  Physical Activity: Not on file  Stress: Not on file  Social Connections: Not on file     Family History: The patient's family history includes COPD (age of onset: 75) in his mother; Heart attack (age of onset: 27) in his father.  ROS:   Please see the history of present illness.     All other systems reviewed and are negative.  EKGs/Labs/Other Studies Reviewed:    The following studies were reviewed today:  Echo 01/24/2018 LV EF: 55% -   60%   -------------------------------------------------------------------  Indications:      CAD (I25.10).   -------------------------------------------------------------------  History:   PMH:   Syncope and dyspnea.  Coronary artery disease.  PMH:   Myocardial infarction.  Risk factors:  Carotid stenosis.  Hypertension. Obese. Dyslipidemia.    -------------------------------------------------------------------  Study Conclusions   - Left ventricle: The cavity size was normal. Wall thickness was    increased in a pattern of mild LVH. There was mild focal basal    hypertrophy of the septum. Systolic function was normal. The    estimated ejection fraction was in the range of 55% to 60%. Wall    motion was normal; there were no regional wall motion    abnormalities.  - Aortic valve: Trileaflet; mildly thickened, mildly calcified    leaflets.  - Aorta: Aortic root dimension: 41 mm (ED).  - Ascending aorta: The ascending aorta was mildly dilated.  - Mitral valve: Calcified annulus. Mildly thickened leaflets .   EKG:  EKG is not ordered today.    Recent Labs: 12/01/2021: BUN 19; Creatinine, Ser 0.90; Hemoglobin 15.0; Potassium 4.0; Sodium 142  Recent Lipid Panel    Component Value Date/Time   CHOL 128 10/06/2014 0505   TRIG 96 10/06/2014 0505   HDL 29 (L) 10/06/2014 0505   CHOLHDL 4.4 10/06/2014 0505   VLDL 19 10/06/2014 0505   LDLCALC 80 10/06/2014 0505     Risk Assessment/Calculations:           Physical Exam:    VS:  BP 122/68   Pulse 80   Ht 5' 8.5" (1.74 m)   Wt 219 lb 9.6 oz (99.6 kg)   SpO2 94%   BMI 32.90 kg/m        Wt Readings from Last 3 Encounters:  03/14/22 219 lb 9.6 oz (99.6 kg)  01/17/22 221 lb 8 oz (100.5 kg)  12/01/21 212 lb (96.2 kg)     GEN:  Well nourished, well developed in no acute distress HEENT: Normal NECK: No JVD; No carotid bruits LYMPHATICS: No lymphadenopathy CARDIAC: RRR, no murmurs, rubs, gallops RESPIRATORY:  Clear to auscultation without rales, wheezing or rhonchi  ABDOMEN: Soft, non-tender, non-distended MUSCULOSKELETAL:  No edema; No deformity  SKIN: Warm and dry NEUROLOGIC:  Alert and oriented x 3 PSYCHIATRIC:  Normal affect   ASSESSMENT:    1. Coronary artery disease involving native coronary artery of native heart without angina pectoris   2. Essential  hypertension   3. Hyperlipidemia LDL goal <70   4. PAD (peripheral artery disease) (HCC)    PLAN:    In order of problems listed above:  CAD: Denies any recent chest  pain.  Continue aspirin and Plavix  Hypertension: Blood pressure stable  Hyperlipidemia: On Crestor  PAD: Recently underwent left SFA stenting by vascular surgery.  Claudication symptom has improved.           Medication Adjustments/Labs and Tests Ordered: Current medicines are reviewed at length with the patient today.  Concerns regarding medicines are outlined above.  No orders of the defined types were placed in this encounter.  No orders of the defined types were placed in this encounter.   Patient Instructions  Medication Instructions:  Your physician recommends that you continue on your current medications as directed. Please refer to the Current Medication list given to you today.  *If you need a refill on your cardiac medications before your next appointment, please call your pharmacy*  Lab Work: NONE ordered at this time of appointment   If you have labs (blood work) drawn today and your tests are completely normal, you will receive your results only by: [place] (if you have MyChart) OR A paper copy in the mail If you have any lab test that is abnormal or we need to change your treatment, we will call you to review the results.  Testing/Procedures: NONE ordered at this time of appointment   Follow-Up: At [facility], you and your health needs are our priority.  As part of our continuing mission to provide you with exceptional heart care, we have created designated Provider Care Teams.  These Care Teams include your primary Cardiologist (physician) and Advanced Practice Providers (APPs -  Physician Assistants and Nurse Practitioners) who all work together to provide you with the care you need, when you need it.  Your next appointment:   1 year(s)  The format for your next  appointment:   In Person  Provider:   Quay Burow, MD     Other Instructions   Important Information About Sugar         Signed, Almyra Deforest, [place]  03/16/2022 9:14 PM    [place]

## 2022-03-14 NOTE — Patient Instructions (Signed)
Medication Instructions:  Your physician recommends that you continue on your current medications as directed. Please refer to the Current Medication list given to you today.  *If you need a refill on your cardiac medications before your next appointment, please call your pharmacy*  Lab Work: NONE ordered at this time of appointment   If you have labs (blood work) drawn today and your tests are completely normal, you will receive your results only by: [place] (if you have MyChart) OR A paper copy in the mail If you have any lab test that is abnormal or we need to change your treatment, we will call you to review the results.  Testing/Procedures: NONE ordered at this time of appointment   Follow-Up: At [facility], you and your health needs are our priority.  As part of our continuing mission to provide you with exceptional heart care, we have created designated Provider Care Teams.  These Care Teams include your primary Cardiologist (physician) and Advanced Practice Providers (APPs -  Physician Assistants and Nurse Practitioners) who all work together to provide you with the care you need, when you need it.  Your next appointment:   1 year(s)  The format for your next appointment:   In Person  Provider:   Quay Burow, MD     Other Instructions   Important Information About Sugar

## 2022-03-15 ENCOUNTER — Ambulatory Visit: Payer: No Typology Code available for payment source | Admitting: Physician Assistant

## 2022-03-16 ENCOUNTER — Encounter: Payer: Self-pay | Admitting: Physician Assistant

## 2022-03-20 DIAGNOSIS — H401122 Primary open-angle glaucoma, left eye, moderate stage: Secondary | ICD-10-CM | POA: Diagnosis not present

## 2022-03-20 DIAGNOSIS — H5213 Myopia, bilateral: Secondary | ICD-10-CM | POA: Diagnosis not present

## 2022-03-20 DIAGNOSIS — H25812 Combined forms of age-related cataract, left eye: Secondary | ICD-10-CM | POA: Diagnosis not present

## 2022-04-07 DIAGNOSIS — H2513 Age-related nuclear cataract, bilateral: Secondary | ICD-10-CM | POA: Diagnosis not present

## 2022-04-26 DIAGNOSIS — H2512 Age-related nuclear cataract, left eye: Secondary | ICD-10-CM | POA: Diagnosis not present

## 2022-04-26 DIAGNOSIS — H269 Unspecified cataract: Secondary | ICD-10-CM | POA: Diagnosis not present

## 2022-05-10 DIAGNOSIS — H269 Unspecified cataract: Secondary | ICD-10-CM | POA: Diagnosis not present

## 2022-05-10 DIAGNOSIS — H2511 Age-related nuclear cataract, right eye: Secondary | ICD-10-CM | POA: Diagnosis not present

## 2022-06-15 DIAGNOSIS — M1612 Unilateral primary osteoarthritis, left hip: Secondary | ICD-10-CM | POA: Diagnosis not present

## 2022-06-15 DIAGNOSIS — Z79899 Other long term (current) drug therapy: Secondary | ICD-10-CM | POA: Diagnosis not present

## 2022-06-15 DIAGNOSIS — E1169 Type 2 diabetes mellitus with other specified complication: Secondary | ICD-10-CM | POA: Diagnosis not present

## 2022-06-15 DIAGNOSIS — I739 Peripheral vascular disease, unspecified: Secondary | ICD-10-CM | POA: Diagnosis not present

## 2022-06-15 DIAGNOSIS — E7800 Pure hypercholesterolemia, unspecified: Secondary | ICD-10-CM | POA: Diagnosis not present

## 2022-06-15 DIAGNOSIS — I10 Essential (primary) hypertension: Secondary | ICD-10-CM | POA: Diagnosis not present

## 2022-06-21 DIAGNOSIS — M25562 Pain in left knee: Secondary | ICD-10-CM | POA: Diagnosis not present

## 2022-06-21 DIAGNOSIS — M25552 Pain in left hip: Secondary | ICD-10-CM | POA: Diagnosis not present

## 2022-06-22 ENCOUNTER — Other Ambulatory Visit (HOSPITAL_COMMUNITY): Payer: Self-pay

## 2022-06-23 ENCOUNTER — Other Ambulatory Visit (HOSPITAL_COMMUNITY): Payer: Self-pay

## 2022-06-23 MED ORDER — CANAGLIFLOZIN 300 MG PO TABS
300.0000 mg | ORAL_TABLET | Freq: Every day | ORAL | 3 refills | Status: DC
  Filled 2022-09-18: qty 90, 90d supply, fill #0
  Filled 2022-12-25 – 2023-01-08 (×2): qty 90, 90d supply, fill #1

## 2022-06-23 MED ORDER — GLIPIZIDE ER 10 MG PO TB24
10.0000 mg | ORAL_TABLET | Freq: Every day | ORAL | 1 refills | Status: DC
Start: 2022-06-22 — End: 2022-10-19
  Filled 2022-09-26: qty 30, 30d supply, fill #0

## 2022-06-28 ENCOUNTER — Other Ambulatory Visit (HOSPITAL_COMMUNITY): Payer: Self-pay

## 2022-06-28 ENCOUNTER — Other Ambulatory Visit: Payer: Self-pay

## 2022-06-28 MED ORDER — INVOKANA 300 MG PO TABS
300.0000 mg | ORAL_TABLET | Freq: Every day | ORAL | 2 refills | Status: AC
  Filled 2022-06-28 – 2022-07-05 (×2): qty 90, 90d supply, fill #0
  Filled 2022-09-11 – 2022-09-18 (×2): qty 90, 90d supply, fill #1

## 2022-06-29 ENCOUNTER — Other Ambulatory Visit: Payer: Self-pay

## 2022-06-29 ENCOUNTER — Encounter: Payer: Self-pay | Admitting: Pharmacist

## 2022-06-29 ENCOUNTER — Other Ambulatory Visit (HOSPITAL_COMMUNITY): Payer: Self-pay

## 2022-06-30 ENCOUNTER — Other Ambulatory Visit (HOSPITAL_COMMUNITY): Payer: Self-pay

## 2022-06-30 ENCOUNTER — Encounter (HOSPITAL_COMMUNITY): Payer: Self-pay

## 2022-07-04 ENCOUNTER — Other Ambulatory Visit: Payer: Self-pay

## 2022-07-05 ENCOUNTER — Other Ambulatory Visit (HOSPITAL_COMMUNITY): Payer: Self-pay

## 2022-07-05 ENCOUNTER — Other Ambulatory Visit: Payer: Self-pay

## 2022-07-24 DIAGNOSIS — M25552 Pain in left hip: Secondary | ICD-10-CM | POA: Diagnosis not present

## 2022-08-17 ENCOUNTER — Other Ambulatory Visit: Payer: Self-pay | Admitting: *Deleted

## 2022-08-17 DIAGNOSIS — H401131 Primary open-angle glaucoma, bilateral, mild stage: Secondary | ICD-10-CM | POA: Diagnosis not present

## 2022-08-17 DIAGNOSIS — I70212 Atherosclerosis of native arteries of extremities with intermittent claudication, left leg: Secondary | ICD-10-CM

## 2022-08-22 ENCOUNTER — Other Ambulatory Visit (HOSPITAL_COMMUNITY): Payer: Self-pay

## 2022-08-22 ENCOUNTER — Other Ambulatory Visit: Payer: Self-pay

## 2022-08-22 MED ORDER — LATANOPROST 0.005 % OP SOLN
1.0000 [drp] | Freq: Every day | OPHTHALMIC | 10 refills | Status: AC
  Filled 2022-08-22: qty 7.5, 75d supply, fill #0
  Filled 2022-11-20: qty 7.5, 75d supply, fill #1
  Filled 2023-01-24: qty 7.5, 75d supply, fill #2

## 2022-09-05 ENCOUNTER — Ambulatory Visit (HOSPITAL_COMMUNITY)
Admission: RE | Admit: 2022-09-05 | Discharge: 2022-09-05 | Disposition: A | Discharge status: Home / Self Care | Payer: No Typology Code available for payment source | Source: Ambulatory Visit | Attending: Vascular Surgery

## 2022-09-05 ENCOUNTER — Ambulatory Visit: Payer: No Typology Code available for payment source | Admitting: Physician Assistant

## 2022-09-05 ENCOUNTER — Ambulatory Visit (HOSPITAL_COMMUNITY)
Admission: RE | Admit: 2022-09-05 | Discharge: 2022-09-05 | Disposition: A | Discharge status: Home / Self Care | Payer: No Typology Code available for payment source | Source: Ambulatory Visit | Attending: Vascular Surgery | Admitting: Vascular Surgery

## 2022-09-05 VITALS — BP 130/75 | HR 85 | Temp 97.9°F | Resp 20 | Ht 68.0 in | Wt 214.0 lb

## 2022-09-05 DIAGNOSIS — I739 Peripheral vascular disease, unspecified: Secondary | ICD-10-CM

## 2022-09-05 DIAGNOSIS — I70212 Atherosclerosis of native arteries of extremities with intermittent claudication, left leg: Secondary | ICD-10-CM | POA: Insufficient documentation

## 2022-09-05 LAB — VAS US ABI WITH/WO TBI
Left ABI: 1.17
Right ABI: 1.28

## 2022-09-06 NOTE — Progress Notes (Signed)
Office Note   History of Present Illness   Reginald Little is a 72 y.o. (1951/01/02) male who presents for surveillance of PAD.  He has undergone left lower extremity angiogram with left SFA and above-knee popliteal angioplasty with stent placement on 12/02/2021 by Dr. Carlis Abbott.  This was performed for lifestyle limiting claudication in the left lower extremity.  After the procedure, his claudication had completely resolved.  He returns today for follow-up.  He is still feeling great and has not had any recurrence of claudication.  He also denies any rest pain or wounds of his lower extremities.  He tries to walk at least one mile daily.  Current Outpatient Medications  Medication Sig Dispense Refill   aspirin EC 81 MG tablet Take 81 mg by mouth daily.     atenolol (TENORMIN) 50 MG tablet Take 1 tablet (50 mg total) by mouth daily. 90 tablet 1   CALCIUM PO Take 1 tablet by mouth daily.     canagliflozin (INVOKANA) 300 MG TABS tablet Take 300 mg by mouth daily before breakfast.     canagliflozin (INVOKANA) 300 MG TABS tablet Take 1 tablet (300 mg total) by mouth daily before first meal of the day. 90 tablet 3   canagliflozin (INVOKANA) 300 MG TABS tablet Take 1 tablet (300 mg total) by mouth daily before the first meal of the day. 90 tablet 2   cholecalciferol (VITAMIN D) 25 MCG (1000 UNIT) tablet Take 1,000 Units by mouth daily.     clopidogrel (PLAVIX) 75 MG tablet Take 1 tablet (75 mg total) by mouth daily. 90 tablet 1   furosemide (LASIX) 20 MG tablet TAKE 1 TABLET EVERY DAY 90 tablet 1   glipiZIDE (GLUCOTROL XL) 10 MG 24 hr tablet Take 10 mg by mouth daily.     glipiZIDE (GLUCOTROL XL) 10 MG 24 hr tablet Take 1 tablet (10 mg total) by mouth daily with breakfast. 30 tablet 1   isosorbide mononitrate (IMDUR) 60 MG 24 hr tablet TAKE 2 TABLETS EVERY DAY 180 tablet 3   latanoprost (XALATAN) 0.005 % ophthalmic solution Place 1 drop into both eyes at bedtime.     latanoprost (XALATAN) 0.005 %  ophthalmic solution Place 1 drop into both eyes at bedtime. 7.5 mL 10   Multiple Vitamin (MULTIVITAMIN WITH MINERALS) TABS tablet Take 1 tablet by mouth daily.     nitroGLYCERIN (NITROSTAT) 0.4 MG SL tablet Place 1 tablet (0.4 mg total) every 5 (five) minutes x 3 doses as needed under the tongue for chest pain. 90 tablet 3   Omega-3 Fatty Acids (OMEGA 3 PO) Take 3 capsules by mouth in the morning. OmegaXL Joint Pain Relief & Inflammation Supplement     pantoprazole (PROTONIX) 40 MG tablet TAKE 1 TABLET DAILY IF NEEDED (Patient taking differently: Take 40 mg by mouth daily before breakfast.) 90 tablet 3   rosuvastatin (CRESTOR) 20 MG tablet TAKE 1 TABLET EVERY DAY (Patient taking differently: Take 20 mg by mouth in the morning.) 90 tablet 0   VITAMIN E PO Take 1 capsule by mouth in the morning.     No current facility-administered medications for this visit.    REVIEW OF SYSTEMS (negative unless checked):   Cardiac:  []  Chest pain or chest pressure? []  Shortness of breath upon activity? []  Shortness of breath when lying flat? []  Irregular heart rhythm?  Vascular:  []  Pain in calf, thigh, or hip brought on by walking? []  Pain in feet at night that wakes you  up from your sleep? []  Blood clot in your veins? []  Leg swelling?  Pulmonary:  []  Oxygen at home? []  Productive cough? []  Wheezing?  Neurologic:  []  Sudden weakness in arms or legs? []  Sudden numbness in arms or legs? []  Sudden onset of difficult speaking or slurred speech? []  Temporary loss of vision in one eye? []  Problems with dizziness?  Gastrointestinal:  []  Blood in stool? []  Vomited blood?  Genitourinary:  []  Burning when urinating? []  Blood in urine?  Psychiatric:  []  Major depression  Hematologic:  []  Bleeding problems? []  Problems with blood clotting?  Dermatologic:  []  Rashes or ulcers?  Constitutional:  []  Fever or chills?  Ear/Nose/Throat:  []  Change in hearing? []  Nose bleeds? []  Sore  throat?  Musculoskeletal:  []  Back pain? []  Joint pain? []  Muscle pain?   Physical Examination   Vitals:   09/05/22 1340  BP: 130/75  Pulse: 85  Resp: 20  Temp: 97.9 F (36.6 C)  SpO2: 97%  Weight: 214 lb (97.1 kg)  Height: 5\' 8"  (1.727 m)   Body mass index is 32.54 kg/m.  General:  WDWN in NAD; vital signs documented above Gait: Not observed HENT: WNL, normocephalic Pulmonary: normal non-labored breathing  Cardiac: regular Abdomen: soft, NT, no masses Skin: without rashes Vascular Exam/Pulses: Nonpalpable pedal pulses, brisk DP/PT Doppler signals bilaterally Extremities: Without ischemic changes, gangrene, cellulitis, or edema Musculoskeletal: no muscle wasting or atrophy  Neurologic: A&O X 3;  No focal weakness or paresthesias are detected Psychiatric:  The pt has Normal affect.  Non-Invasive Vascular imaging   ABI (09/05/2022) R:  ABI: 1.28 (1.40),  PT: tri DP: mono TBI:  0.97 L:  ABI: 1.17 (1.33),  PT: tri DP: bi TBI: 1.14  LLE Arterial Duplex (09/05/2022) Patent left SFA and above-knee popliteal artery stents without stenosis   Medical Decision Making   Reginald Little is a 72 y.o. male who presents for surveillance of PAD  Based on the patient's vascular studies, his ABIs are unchanged and nearly noncompressible bilaterally.  He has triphasic flow in bilateral PT arteries Duplex also demonstrates patent left SFA and above-knee popliteal artery stents without signs of stenosis He continues to be free of any claudication, rest pain, or nonhealing wounds to lower extremities.  He stays active daily and tries to walk at least 1 mile every day He can follow-up with our office in 1 year with repeat ABIs and left lower extremity arterial duplex study  Vicente Serene PA-C [facility]: [phone]  Call MD: Trula Slade

## 2022-09-08 ENCOUNTER — Other Ambulatory Visit (HOSPITAL_COMMUNITY): Payer: Self-pay

## 2022-09-11 ENCOUNTER — Other Ambulatory Visit: Payer: Self-pay

## 2022-09-13 ENCOUNTER — Other Ambulatory Visit: Payer: Self-pay

## 2022-09-15 DIAGNOSIS — J3489 Other specified disorders of nose and nasal sinuses: Secondary | ICD-10-CM | POA: Diagnosis not present

## 2022-09-15 DIAGNOSIS — R0989 Other specified symptoms and signs involving the circulatory and respiratory systems: Secondary | ICD-10-CM | POA: Diagnosis not present

## 2022-09-15 DIAGNOSIS — R059 Cough, unspecified: Secondary | ICD-10-CM | POA: Diagnosis not present

## 2022-09-15 DIAGNOSIS — J029 Acute pharyngitis, unspecified: Secondary | ICD-10-CM | POA: Diagnosis not present

## 2022-09-18 ENCOUNTER — Other Ambulatory Visit (HOSPITAL_COMMUNITY): Payer: Self-pay

## 2022-09-18 ENCOUNTER — Other Ambulatory Visit: Payer: Self-pay

## 2022-09-19 ENCOUNTER — Other Ambulatory Visit (HOSPITAL_COMMUNITY): Payer: Self-pay

## 2022-09-19 ENCOUNTER — Other Ambulatory Visit: Payer: Self-pay

## 2022-09-26 ENCOUNTER — Other Ambulatory Visit (HOSPITAL_COMMUNITY): Payer: Self-pay

## 2022-09-26 ENCOUNTER — Other Ambulatory Visit: Payer: Self-pay

## 2022-10-19 ENCOUNTER — Other Ambulatory Visit: Payer: Self-pay

## 2022-10-19 ENCOUNTER — Other Ambulatory Visit (HOSPITAL_COMMUNITY): Payer: Self-pay

## 2022-10-19 ENCOUNTER — Telehealth: Payer: Self-pay | Admitting: Cardiovascular Disease

## 2022-10-19 MED ORDER — GLIPIZIDE ER 10 MG PO TB24
10.0000 mg | ORAL_TABLET | Freq: Every day | ORAL | 1 refills | Status: DC
  Filled 2022-10-19: qty 30, 30d supply, fill #0
  Filled 2022-11-20: qty 30, 30d supply, fill #1

## 2022-10-19 NOTE — Telephone Encounter (Signed)
*  STAT* If patient is at the pharmacy, call can be transferred to refill team.   1. Which medications need to be refilled? (please list name of each medication and dose if known)  atenolol (TENORMIN) 50 MG tablet   furosemide (LASIX) 20 MG tablet  clopidogrel (PLAVIX) 75 MG tablet  pantoprazole (PROTONIX) 40 MG tablet   2. Which pharmacy/location (including street and city if local pharmacy) is medication to be sent to? [facility] [phone]  3. Do they need a 30 day or 90 day supply? 90 day  Please note new pharmacy

## 2022-10-20 ENCOUNTER — Other Ambulatory Visit: Payer: Self-pay

## 2022-10-20 MED ORDER — ATENOLOL 50 MG PO TABS
50.0000 mg | ORAL_TABLET | Freq: Every day | ORAL | 1 refills | Status: DC
  Filled 2022-10-20: qty 90, 90d supply, fill #0
  Filled 2023-01-24: qty 90, 90d supply, fill #1

## 2022-10-20 MED ORDER — PANTOPRAZOLE SODIUM 40 MG PO TBEC
40.0000 mg | DELAYED_RELEASE_TABLET | Freq: Every day | ORAL | 1 refills | Status: AC
  Filled 2022-10-20: qty 90, 90d supply, fill #0
  Filled 2023-01-24: qty 90, 90d supply, fill #1

## 2022-10-20 MED ORDER — CLOPIDOGREL BISULFATE 75 MG PO TABS
75.0000 mg | ORAL_TABLET | Freq: Every day | ORAL | 1 refills | Status: DC
  Filled 2022-10-20: qty 90, 90d supply, fill #0
  Filled 2023-01-24: qty 90, 90d supply, fill #1

## 2022-10-20 MED ORDER — FUROSEMIDE 20 MG PO TABS
20.0000 mg | ORAL_TABLET | Freq: Every day | ORAL | 1 refills | Status: DC
  Filled 2022-10-20: qty 90, 90d supply, fill #0
  Filled 2023-01-24: qty 90, 90d supply, fill #1

## 2022-10-20 NOTE — Telephone Encounter (Signed)
Refills has been sent to the pharmacy. 

## 2022-10-26 ENCOUNTER — Other Ambulatory Visit (HOSPITAL_COMMUNITY): Payer: Self-pay

## 2022-10-26 DIAGNOSIS — L0390 Cellulitis, unspecified: Secondary | ICD-10-CM | POA: Diagnosis not present

## 2022-10-26 MED ORDER — DOXYCYCLINE HYCLATE 100 MG PO CAPS
100.0000 mg | ORAL_CAPSULE | Freq: Two times a day (BID) | ORAL | 0 refills | Status: AC
  Filled 2022-10-26: qty 6, 3d supply, fill #0

## 2022-10-31 ENCOUNTER — Other Ambulatory Visit: Payer: Self-pay | Admitting: Cardiovascular Disease

## 2022-10-31 ENCOUNTER — Other Ambulatory Visit (HOSPITAL_COMMUNITY): Payer: Self-pay

## 2022-10-31 MED ORDER — ROSUVASTATIN CALCIUM 20 MG PO TABS
20.0000 mg | ORAL_TABLET | Freq: Every day | ORAL | 0 refills | Status: DC
  Filled 2022-10-31: qty 90, 90d supply, fill #0

## 2022-10-31 MED ORDER — ROSUVASTATIN CALCIUM 20 MG PO TABS: 20.0000 mg | ORAL_TABLET | Freq: Every day | ORAL | 0 refills | Status: DC

## 2022-11-14 DIAGNOSIS — H401131 Primary open-angle glaucoma, bilateral, mild stage: Secondary | ICD-10-CM | POA: Diagnosis not present

## 2022-11-20 ENCOUNTER — Other Ambulatory Visit (HOSPITAL_COMMUNITY): Payer: Self-pay

## 2022-12-18 ENCOUNTER — Other Ambulatory Visit (HOSPITAL_COMMUNITY): Payer: Self-pay

## 2022-12-19 ENCOUNTER — Other Ambulatory Visit (HOSPITAL_COMMUNITY): Payer: Self-pay

## 2022-12-19 MED ORDER — GLIPIZIDE ER 10 MG PO TB24
10.0000 mg | ORAL_TABLET | Freq: Every day | ORAL | 0 refills | Status: DC
  Filled 2022-12-19: qty 30, 30d supply, fill #0

## 2022-12-25 ENCOUNTER — Other Ambulatory Visit (HOSPITAL_COMMUNITY): Payer: Self-pay

## 2022-12-26 ENCOUNTER — Other Ambulatory Visit (HOSPITAL_COMMUNITY): Payer: Self-pay

## 2022-12-26 ENCOUNTER — Encounter (HOSPITAL_COMMUNITY): Payer: Self-pay

## 2022-12-27 ENCOUNTER — Other Ambulatory Visit (HOSPITAL_COMMUNITY): Payer: Self-pay

## 2022-12-27 DIAGNOSIS — D2262 Melanocytic nevi of left upper limb, including shoulder: Secondary | ICD-10-CM | POA: Diagnosis not present

## 2022-12-27 DIAGNOSIS — Z85828 Personal history of other malignant neoplasm of skin: Secondary | ICD-10-CM | POA: Diagnosis not present

## 2022-12-27 DIAGNOSIS — D485 Neoplasm of uncertain behavior of skin: Secondary | ICD-10-CM | POA: Diagnosis not present

## 2022-12-27 DIAGNOSIS — L821 Other seborrheic keratosis: Secondary | ICD-10-CM | POA: Diagnosis not present

## 2022-12-27 DIAGNOSIS — L814 Other melanin hyperpigmentation: Secondary | ICD-10-CM | POA: Diagnosis not present

## 2022-12-27 DIAGNOSIS — L570 Actinic keratosis: Secondary | ICD-10-CM | POA: Diagnosis not present

## 2023-01-02 ENCOUNTER — Other Ambulatory Visit (HOSPITAL_COMMUNITY): Payer: Self-pay

## 2023-01-02 DIAGNOSIS — E7800 Pure hypercholesterolemia, unspecified: Secondary | ICD-10-CM | POA: Diagnosis not present

## 2023-01-02 DIAGNOSIS — Z0000 Encounter for general adult medical examination without abnormal findings: Secondary | ICD-10-CM | POA: Diagnosis not present

## 2023-01-02 DIAGNOSIS — Z79899 Other long term (current) drug therapy: Secondary | ICD-10-CM | POA: Diagnosis not present

## 2023-01-02 DIAGNOSIS — I10 Essential (primary) hypertension: Secondary | ICD-10-CM | POA: Diagnosis not present

## 2023-01-02 DIAGNOSIS — Z1331 Encounter for screening for depression: Secondary | ICD-10-CM | POA: Diagnosis not present

## 2023-01-02 DIAGNOSIS — E1169 Type 2 diabetes mellitus with other specified complication: Secondary | ICD-10-CM | POA: Diagnosis not present

## 2023-01-02 DIAGNOSIS — I2510 Atherosclerotic heart disease of native coronary artery without angina pectoris: Secondary | ICD-10-CM | POA: Diagnosis not present

## 2023-01-02 DIAGNOSIS — Z125 Encounter for screening for malignant neoplasm of prostate: Secondary | ICD-10-CM | POA: Diagnosis not present

## 2023-01-02 DIAGNOSIS — D126 Benign neoplasm of colon, unspecified: Secondary | ICD-10-CM | POA: Diagnosis not present

## 2023-01-02 DIAGNOSIS — E1151 Type 2 diabetes mellitus with diabetic peripheral angiopathy without gangrene: Secondary | ICD-10-CM | POA: Diagnosis not present

## 2023-01-02 DIAGNOSIS — I700 Atherosclerosis of aorta: Secondary | ICD-10-CM | POA: Diagnosis not present

## 2023-01-02 MED ORDER — ROSUVASTATIN CALCIUM 20 MG PO TABS
20.0000 mg | ORAL_TABLET | Freq: Every day | ORAL | 3 refills | Status: DC
  Filled 2023-01-02 – 2023-01-24 (×2): qty 90, 90d supply, fill #0

## 2023-01-03 ENCOUNTER — Other Ambulatory Visit (HOSPITAL_BASED_OUTPATIENT_CLINIC_OR_DEPARTMENT_OTHER): Payer: Self-pay

## 2023-01-05 ENCOUNTER — Other Ambulatory Visit (HOSPITAL_COMMUNITY): Payer: Self-pay

## 2023-01-05 MED ORDER — FLUOROURACIL 5 % EX CREA
TOPICAL_CREAM | CUTANEOUS | 0 refills | Status: DC
  Filled 2023-01-05: qty 40, 30d supply, fill #0

## 2023-01-08 ENCOUNTER — Other Ambulatory Visit (HOSPITAL_COMMUNITY): Payer: Self-pay

## 2023-01-09 ENCOUNTER — Other Ambulatory Visit (HOSPITAL_BASED_OUTPATIENT_CLINIC_OR_DEPARTMENT_OTHER): Payer: Self-pay

## 2023-01-09 ENCOUNTER — Other Ambulatory Visit (HOSPITAL_COMMUNITY): Payer: Self-pay

## 2023-01-09 ENCOUNTER — Encounter: Payer: Self-pay | Admitting: Pharmacist

## 2023-01-09 ENCOUNTER — Other Ambulatory Visit: Payer: Self-pay

## 2023-01-24 ENCOUNTER — Other Ambulatory Visit: Payer: Self-pay

## 2023-01-24 ENCOUNTER — Other Ambulatory Visit (HOSPITAL_COMMUNITY): Payer: Self-pay

## 2023-01-24 MED ORDER — GLIPIZIDE ER 10 MG PO TB24
10.0000 mg | ORAL_TABLET | Freq: Every day | ORAL | 0 refills | Status: AC
  Filled 2023-01-24: qty 90, 90d supply, fill #0

## 2023-02-22 DIAGNOSIS — H401131 Primary open-angle glaucoma, bilateral, mild stage: Secondary | ICD-10-CM | POA: Diagnosis not present

## 2023-02-22 DIAGNOSIS — E119 Type 2 diabetes mellitus without complications: Secondary | ICD-10-CM | POA: Diagnosis not present

## 2023-03-29 ENCOUNTER — Other Ambulatory Visit: Payer: Self-pay | Admitting: Cardiovascular Disease

## 2023-04-02 ENCOUNTER — Telehealth: Payer: Self-pay | Admitting: Cardiovascular Disease

## 2023-04-02 MED ORDER — ATENOLOL 50 MG PO TABS: 50.0000 mg | ORAL_TABLET | Freq: Every day | ORAL | 0 refills | Status: DC

## 2023-04-02 NOTE — Telephone Encounter (Signed)
Pt's medication was sent to pt's pharmacy as requested. Confirmation received.  °

## 2023-04-02 NOTE — Telephone Encounter (Signed)
*  STAT* If patient is at the pharmacy, call can be transferred to refill team.   1. Which medications need to be refilled? (please list name of each medication and dose if known) atenolol (TENORMIN) 50 MG tablet   2. Which pharmacy/location (including street and city if local pharmacy) is medication to be sent to? [facility] - [place], [place] - [address]   3. Do they need a 30 day or 90 day supply? 90

## 2023-04-19 ENCOUNTER — Other Ambulatory Visit: Payer: Self-pay | Admitting: Cardiovascular Disease

## 2023-04-20 ENCOUNTER — Other Ambulatory Visit: Payer: Self-pay

## 2023-04-23 ENCOUNTER — Other Ambulatory Visit: Payer: Self-pay | Admitting: Cardiovascular Disease

## 2023-04-25 ENCOUNTER — Other Ambulatory Visit: Payer: Self-pay | Admitting: Cardiovascular Disease

## 2023-04-26 DIAGNOSIS — R350 Frequency of micturition: Secondary | ICD-10-CM | POA: Diagnosis not present

## 2023-04-26 DIAGNOSIS — N529 Male erectile dysfunction, unspecified: Secondary | ICD-10-CM | POA: Diagnosis not present

## 2023-05-17 ENCOUNTER — Other Ambulatory Visit: Payer: Self-pay | Admitting: Cardiovascular Disease

## 2023-05-21 ENCOUNTER — Telehealth: Payer: Self-pay | Admitting: Cardiovascular Disease

## 2023-05-21 MED ORDER — CLOPIDOGREL BISULFATE 75 MG PO TABS: 75.0000 mg | ORAL_TABLET | Freq: Every day | ORAL | 0 refills | Status: DC

## 2023-05-21 MED ORDER — ATENOLOL 50 MG PO TABS: 50.0000 mg | ORAL_TABLET | Freq: Every day | ORAL | 0 refills | Status: DC

## 2023-05-21 MED ORDER — ISOSORBIDE MONONITRATE ER 60 MG PO TB24: 60.0000 mg | ORAL_TABLET | Freq: Every day | ORAL | 0 refills | Status: DC

## 2023-05-21 NOTE — Telephone Encounter (Signed)
*  STAT* If patient is at the pharmacy, call can be transferred to refill team.   1. Which medications need to be refilled? (please list name of each medication and dose if known)    isosorbide mononitrate (IMDUR) 60 MG 24 hr tablet   atenolol (TENORMIN) 50 MG tablet  clopidogrel (PLAVIX) 75 MG tablet    4. Which pharmacy/location (including street and city if local pharmacy) is medication to be sent to?  [facility] - [place], [place] - [address]     5. Do they need a 30 day or 90 day supply? 90    Pt scheduled to see [place], [place] 12/19

## 2023-05-24 DIAGNOSIS — Z961 Presence of intraocular lens: Secondary | ICD-10-CM | POA: Diagnosis not present

## 2023-05-24 DIAGNOSIS — H401131 Primary open-angle glaucoma, bilateral, mild stage: Secondary | ICD-10-CM | POA: Diagnosis not present

## 2023-05-31 ENCOUNTER — Encounter: Payer: Self-pay | Admitting: Physician Assistant

## 2023-05-31 ENCOUNTER — Ambulatory Visit: Payer: Medicare HMO | Attending: Physician Assistant | Admitting: Physician Assistant

## 2023-05-31 VITALS — BP 124/58 | HR 83 | Ht 68.0 in | Wt 209.0 lb

## 2023-05-31 DIAGNOSIS — E785 Hyperlipidemia, unspecified: Secondary | ICD-10-CM | POA: Diagnosis not present

## 2023-05-31 DIAGNOSIS — I10 Essential (primary) hypertension: Secondary | ICD-10-CM

## 2023-05-31 DIAGNOSIS — I739 Peripheral vascular disease, unspecified: Secondary | ICD-10-CM | POA: Diagnosis not present

## 2023-05-31 DIAGNOSIS — I2510 Atherosclerotic heart disease of native coronary artery without angina pectoris: Secondary | ICD-10-CM | POA: Diagnosis not present

## 2023-05-31 MED ORDER — ROSUVASTATIN CALCIUM 20 MG PO TABS: 20.0000 mg | ORAL_TABLET | Freq: Every day | ORAL | 3 refills | Status: DC

## 2023-05-31 MED ORDER — ISOSORBIDE MONONITRATE ER 60 MG PO TB24: ORAL_TABLET | ORAL | 2 refills | Status: DC

## 2023-05-31 MED ORDER — FUROSEMIDE 20 MG PO TABS: 20.0000 mg | ORAL_TABLET | Freq: Every day | ORAL | 3 refills | Status: DC

## 2023-05-31 MED ORDER — CLOPIDOGREL BISULFATE 75 MG PO TABS: 75.0000 mg | ORAL_TABLET | Freq: Every day | ORAL | 3 refills | Status: DC

## 2023-05-31 MED ORDER — ATENOLOL 50 MG PO TABS: 50.0000 mg | ORAL_TABLET | Freq: Every day | ORAL | 3 refills | Status: DC

## 2023-05-31 NOTE — Patient Instructions (Signed)
Medication Instructions:  NO CHANGES *If you need a refill on your cardiac medications before your next appointment, please call your pharmacy*   Lab Work: NO LABS If you have labs (blood work) drawn today and your tests are completely normal, you will receive your results only by: MyChart Message (if you have MyChart) OR A paper copy in the mail If you have any lab test that is abnormal or we need to change your treatment, we will call you to review the results.   Testing/Procedures: NO TESTING   Follow-Up: At [facility], you and your health needs are our priority.  As part of our continuing mission to provide you with exceptional heart care, we have created designated Provider Care Teams.  These Care Teams include your primary Cardiologist (physician) and Advanced Practice Providers (APPs -  Physician Assistants and Nurse Practitioners) who all work together to provide you with the care you need, when you need it.  Your next appointment:   1 year(s)  Provider:   Nanetta Batty, MD

## 2023-05-31 NOTE — Progress Notes (Signed)
Cardiology Office Note:  .   Date:  06/02/2023  ID:  Reginald Little, DOB 12-Oct-1950, MRN [id number] PCP: Merlene Laughter, MD (Inactive)  [facility] Providers Cardiologist:  Nanetta Batty, MD Cardiology APP:  Darrol Jump, PA-C     History of Present Illness: .   Reginald Little is a 72 y.o. male with a hx of CAD (DES x 2 to RCA in 2013, DES of OM and PLA branches 2015, LHC in 2016 w/ patent stents and nonobs CAD), carotid artery disease w/ L-CEA in [place] 2010, diet controlled DM, HTN, HLD and GERD. Last echocardiogram obtained on 06/06/2014 showed EF 55-60%, grade 1 diastolic dysfunction. His Myoview obtained on 06/07/2014 showed EF 59%, mixed pattern of reversible or nonreversible decreased myocardial perfusion involving the inferior and inferolateral wall, moderate risk stress test finding. He ended up having 2.25 x 12 mm Promus DES placed in PLA 1 and 2.5 x 12 mm Promus DES placed in OM 2 by Dr. Excell Seltzer on 06/08/2014. He returned in April 2016 for another cardiac catheterization which did not show significant culprit lesion to explain his symptoms.  Myoview in January 2018 came back low risk, EF 62%, fixed medium-sized mild basal and mid inferior perfusion defect more likely to be attenuation artifact than infarction, overall no ischemia.   Lower extremity arterial Doppler in October 2018 showed left ABI 1.01, right ABI 0.99.  Echocardiogram obtained on 01/24/2018 showed EF 55 to 60%, mildly dilated aortic root measuring 41 mm, mild LVH.  Carotid Doppler obtained on 03/08/2018 showed mild disease on the right, no significant plaque on the left side.  Repeat carotid Doppler in October 2021 showed 40 to 59% disease in the right ICA, 1 to 39% disease in the left ICA.  Patient was seen by Dr. Chestine Spore of vascular surgery for evaluation of lower extremity claudication symptom.  ABI showed right ABI 1.09, left ABI 0.82.  Patient subsequently underwent lower extremity angiography on 12/01/2021 which  revealed flow-limiting lesion in the left SFA treated with stent.  Postprocedure, patient was continued on aspirin and Plavix.   I last saw the patient in October 2023 at which time he was doing well without any claudication symptom or anginal symptom.  Lower extremity arterial Doppler in March 2024 showed patent stent.  No significant lower extremity lesion.  Patient presents today for follow-up.  He denies any claudication symptom, chest pain, shortness of breath or lower extremity edema.  EKG is unchanged with persistent Q waves in the inferior lead but no other abnormality.  Instead of 60 mg of Imdur, he has been taking 120 mg daily of Imdur.  Overall, patient has been doing well.  ROS:   He denies chest pain, palpitations, dyspnea, pnd, orthopnea, n, v, dizziness, syncope, edema, weight gain, or early satiety. All other systems reviewed and are otherwise negative except as noted above.    Studies Reviewed: Marland Kitchen   EKG Interpretation Date/Time:  Thursday May 31 2023 16:15:17 EST Ventricular Rate:  83 PR Interval:  152 QRS Duration:  92 QT Interval:  402 QTC Calculation: 472 R Axis:   39  Text Interpretation: Normal sinus rhythm Q waves in the inferior leads. Confirmed by Azalee Course [phone]) on 06/02/2023 4:55:45 PM    Cardiac Studies & Procedures     STRESS TESTS  MYOCARDIAL PERFUSION IMAGING 06/13/2016  Narrative  The left ventricular ejection fraction is normal (55-65%).  Nuclear stress EF: 62%.  There was no ST segment deviation noted during stress.  This is a low risk study.  1. EF 62%, normal wall motion. 2. Fixed medium-sized, mild basal to mid inferior perfusion defect.  Given normal inferior wall motion, I suspect this is more likely attenuation than infarction.  No ischemia. 3. Low risk study.  ECHOCARDIOGRAM  ECHOCARDIOGRAM COMPLETE 01/24/2018  Narrative *[facility]* [address] [place], [place]  [address] [phone]  ------------------------------------------------------------------- Echocardiography  Patient:    Reginald Little, Reginald Little MR #:       [id number] Study Date: 01/24/2018 Gender:     M Age:        63 Height:     174 cm Weight:     99.3 kg BSA:        2.22 m^2 Pt. Status: Room:  SONOGRAPHER  Dewitt Hoes, RDCS PERFORMING   [facility], Outpatient ATTENDING    Azalee Course [id number]     BMWU, Elisa Sorlie [id number] Suann Larry, Wynema Birch [id number]  cc:  ------------------------------------------------------------------- LV EF: 55% -   60%  ------------------------------------------------------------------- Indications:      CAD (I25.10).  ------------------------------------------------------------------- History:   PMH:   Syncope and dyspnea.  Coronary artery disease. PMH:   Myocardial infarction.  Risk factors:  Carotid stenosis. Hypertension. Obese. Dyslipidemia.  ------------------------------------------------------------------- Study Conclusions  - Left ventricle: The cavity size was normal. Wall thickness was increased in a pattern of mild LVH. There was mild focal basal hypertrophy of the septum. Systolic function was normal. The estimated ejection fraction was in the range of 55% to 60%. Wall motion was normal; there were no regional wall motion abnormalities. - Aortic valve: Trileaflet; mildly thickened, mildly calcified leaflets. - Aorta: Aortic root dimension: 41 mm (ED). - Ascending aorta: The ascending aorta was mildly dilated. - Mitral valve: Calcified annulus. Mildly thickened leaflets .  ------------------------------------------------------------------- Labs, prior tests, procedures, and surgery: Transthoracic echocardiography (06/06/2014).     EF was 55%.  ------------------------------------------------------------------- Study data:  Comparison was made to the study of 06/06/2014.  Study status:  Routine.  Procedure:  The patient reported no pain pre  or post test. Transthoracic echocardiography. Image quality was adequate.  Study completion:  There were no complications. Echocardiography.  M-mode, complete 2D, 3D, spectral Doppler, and color Doppler.  Birthdate:  Patient birthdate: [birth year].  Age: Patient is 72 yr old.  Sex:  Gender: male.    BMI: 32.8 kg/m^2. Blood pressure:     132/72  Patient status:  Outpatient.  Study date:  Study date: 01/24/2018. Study time: 01:45 PM.  Location: [facility]  -------------------------------------------------------------------  ------------------------------------------------------------------- Left ventricle:  The cavity size was normal. Wall thickness was increased in a pattern of mild LVH. There was mild focal basal hypertrophy of the septum. Systolic function was normal. The estimated ejection fraction was in the range of 55% to 60%. Wall motion was normal; there were no regional wall motion abnormalities.  ------------------------------------------------------------------- Aortic valve:   Trileaflet; mildly thickened, mildly calcified leaflets. Mobility was not restricted.  Doppler:  Transvalvular velocity was within the normal range. There was no stenosis. There was no regurgitation.  ------------------------------------------------------------------- Aorta:  Ascending aorta: The ascending aorta was mildly dilated.  ------------------------------------------------------------------- Mitral valve:   Calcified annulus. Mildly thickened leaflets . Mobility was not restricted.  Doppler:  Transvalvular velocity was within the normal range. There was no evidence for stenosis. There was no regurgitation.    Valve area by pressure half-time: 3.33 cm^2. Indexed valve area by pressure half-time: 1.5 cm^2/m^2. Peak gradient (D): 2 mm Hg.  -------------------------------------------------------------------  Left atrium:  The atrium was normal in  size.  ------------------------------------------------------------------- Right ventricle:  The cavity size was normal. Wall thickness was normal. Systolic function was normal.  ------------------------------------------------------------------- Pulmonic valve:   Poorly visualized.  Structurally normal valve. Cusp separation was normal.  Doppler:  Transvalvular velocity was within the normal range. There was no evidence for stenosis. There was no regurgitation.  ------------------------------------------------------------------- Tricuspid valve:   Structurally normal valve.    Doppler: Transvalvular velocity was within the normal range. There was no regurgitation.  ------------------------------------------------------------------- Pulmonary artery:   The main pulmonary artery was normal-sized. Systolic pressure was within the normal range.  ------------------------------------------------------------------- Right atrium:  The atrium was normal in size.  ------------------------------------------------------------------- Pericardium:  There was no pericardial effusion.  ------------------------------------------------------------------- Systemic veins: Inferior vena cava: The vessel was normal in size.  ------------------------------------------------------------------- Measurements  Left ventricle                         Value          Reference LV ID, ED, PLAX chordal        (L)     33    mm       43 - 52 LV ID, ES, PLAX chordal                24    mm       23 - 38 LV fx shortening, PLAX chordal (L)     27    %        >=29 LV PW thickness, ED                    12    mm       --------- IVS/LV PW ratio, ED                    1              <=1.3 Stroke volume, 2D                      70    ml       --------- Stroke volume/bsa, 2D                  31    ml/m^2   --------- LV e&', lateral                         5.76  cm/s     --------- LV E/e&', lateral                        13.14          --------- LV e&', medial                          3.49  cm/s     --------- LV E/e&', medial                        21.69          --------- LV e&', average                         4.63  cm/s     --------- LV E/e&', average  16.37          ---------  Ventricular septum                     Value          Reference IVS thickness, ED                      12    mm       ---------  LVOT                                   Value          Reference LVOT ID, S                             22    mm       --------- LVOT area                              3.8   cm^2     --------- LVOT peak velocity, S                  80.5  cm/s     --------- LVOT mean velocity, S                  52.6  cm/s     --------- LVOT VTI, S                            18.3  cm       ---------  Aorta                                  Value          Reference Aortic root ID, ED                     41    mm       --------- Ascending aorta ID, A-P, S             34    mm       ---------  Left atrium                            Value          Reference LA ID, A-P, ES                         36    mm       --------- LA ID/bsa, A-P                         1.62  cm/m^2   <=2.2 LA volume, S                           36.8  ml       --------- LA volume/bsa, S                       16.5  ml/m^2   --------- LA volume, ES, 1-p A4C                 35.9  ml       --------- LA volume/bsa, ES, 1-p A4C             16.1  ml/m^2   --------- LA volume, ES, 1-p A2C                 34    ml       --------- LA volume/bsa, ES, 1-p A2C             15.3  ml/m^2   ---------  Mitral valve                           Value          Reference Mitral E-wave peak velocity            75.7  cm/s     --------- Mitral A-wave peak velocity            123   cm/s     --------- Mitral deceleration time               225   ms       150 - 230 Mitral pressure half-time              66    ms       --------- Mitral peak gradient, D                 2     mm Hg    --------- Mitral E/A ratio, peak                 0.6            --------- Mitral valve area, PHT, DP             3.33  cm^2     --------- Mitral valve area/bsa, PHT, DP         1.5   cm^2/m^2 ---------  Systemic veins                         Value          Reference Estimated CVP                          3     mm Hg    ---------  Right ventricle                        Value          Reference TAPSE                                  17.5  mm       --------- RV s&', lateral, S                      9.81  cm/s     ---------  Legend: (L)  and  (H)  mark values outside specified reference range.  ------------------------------------------------------------------- Prepared and Electronically Authenticated by  Donato Schultz, M.D. 2019-08-15T15:07:37             Risk Assessment/Calculations:  Physical Exam:   VS:  BP (!) 124/58   Pulse 83   Ht 5\' 8"  (1.727 m)   Wt 209 lb (94.8 kg)   SpO2 95%   BMI 31.78 kg/m    Wt Readings from Last 3 Encounters:  05/31/23 209 lb (94.8 kg)  09/05/22 214 lb (97.1 kg)  03/14/22 219 lb 9.6 oz (99.6 kg)    GEN: Well nourished, well developed in no acute distress NECK: No JVD; No carotid bruits CARDIAC: RRR, no murmurs, rubs, gallops RESPIRATORY:  Clear to auscultation without rales, wheezing or rhonchi  ABDOMEN: Soft, non-tender, non-distended EXTREMITIES:  No edema; No deformity   ASSESSMENT AND PLAN: .    Coronary Artery Disease Stable with no new symptoms. EKG unchanged from last year. Patient is on appropriate cardiac medications including Atenolol, Plavix, Lasix, and Imdur (120mg  daily). -Continue current medications. -Next follow-up with Dr. Allyson Sabal in December 2025.  Peripheral Artery Disease Post-intervention last year, patient reports good blood flow and no claudication symptoms. Recent Doppler in March 2024 showed patent stent and no significant lesions. -Continue current  management.  Hypertension: Blood pressure well-controlled  Hyperlipidemia Total cholesterol 119, HDL 34, LDL 63, Triglycerides 119. Patient is on Crestor 20mg  daily. -Continue Crestor 20mg  daily.        Dispo: Follow-up in 1 year  Signed, Azalee Course, [place]

## 2023-06-08 ENCOUNTER — Other Ambulatory Visit: Payer: Self-pay | Admitting: Cardiovascular Disease

## 2023-08-31 ENCOUNTER — Other Ambulatory Visit: Payer: Self-pay

## 2023-08-31 DIAGNOSIS — I70212 Atherosclerosis of native arteries of extremities with intermittent claudication, left leg: Secondary | ICD-10-CM

## 2023-09-11 ENCOUNTER — Ambulatory Visit (INDEPENDENT_AMBULATORY_CARE_PROVIDER_SITE_OTHER)
Admission: RE | Admit: 2023-09-11 | Discharge: 2023-09-11 | Disposition: A | Discharge status: Home / Self Care | Payer: No Typology Code available for payment source | Source: Ambulatory Visit | Attending: Vascular Surgery | Admitting: Vascular Surgery

## 2023-09-11 ENCOUNTER — Ambulatory Visit (INDEPENDENT_AMBULATORY_CARE_PROVIDER_SITE_OTHER): Payer: No Typology Code available for payment source | Admitting: Physician Assistant

## 2023-09-11 ENCOUNTER — Ambulatory Visit (HOSPITAL_COMMUNITY)
Admission: RE | Admit: 2023-09-11 | Discharge: 2023-09-11 | Disposition: A | Discharge status: Home / Self Care | Payer: No Typology Code available for payment source | Source: Ambulatory Visit | Attending: Vascular Surgery | Admitting: Vascular Surgery

## 2023-09-11 VITALS — BP 171/71 | HR 89 | Temp 97.9°F | Resp 18 | Ht 68.0 in | Wt 214.1 lb

## 2023-09-11 DIAGNOSIS — I70212 Atherosclerosis of native arteries of extremities with intermittent claudication, left leg: Secondary | ICD-10-CM | POA: Diagnosis present

## 2023-09-11 DIAGNOSIS — I739 Peripheral vascular disease, unspecified: Secondary | ICD-10-CM

## 2023-09-11 LAB — VAS US ABI WITH/WO TBI
Left ABI: 1.33
Right ABI: 1.46

## 2023-09-11 NOTE — Progress Notes (Signed)
 [facility] [place] HISTORY AND PHYSICAL   History of Present Illness:  Patient is a 73 y.o. year old male who presents for follow up of PAD. He is s/p Angiogram on 12/02/21. He underwent Left lower extremity arteriogram with Left SFA and above-knee popliteal angioplasty with stent placement (6 mm x 40 mm drug-coated Eluvia in the proximal SFA postdilated with a 5 mm Mustang and 6 mm x 100 mm drug-coated Eluvia in the distal SFA/above-knee popliteal artery postdilated with a 5 mm Mustang) by Dr. Chestine Spore. This was performed for lifestyle limiting claudication.    He denies claudication, non healing wounds and rest pain.  He has stop going to the gym due to the cold weather, but he plans on restarting this soon.      The pt is on a statin for cholesterol management.  The pt is on a daily aspirin.   Other AC:  Plavix The pt is on BB for hypertension.   The pt is diabetic.   Tobacco hx:  Former, quit 2013  Past Medical History:  Diagnosis Date   Atypical chest pain    Carotid artery disease (HCC)    a. s/p L CEA   Coronary artery disease    a. s/p DES x 2 to the RCA (05/2012 at [facility]) b. 05/2014 Abnl MV w/ inf/inflat partially rev defect;  c. s/p DES of OM2 and RPL1 (05/2014)  d. s/p LHC on 10/06/14 with patent stents and non-obs CAD; e. 06/2016 MV: low risk, EF 62%, fixed medium-sized, mild basal and inf defect - likely attenuation. No ischemia.   Diabetes mellitus without complication (HCC)    GERD (gastroesophageal reflux disease)    HLD (hyperlipidemia)    Hypertension    Obesity    Syncope    a. 10/2014 - felt to be 2/2 hypotension after recent imdur titration.    Past Surgical History:  Procedure Laterality Date   ABDOMINAL AORTOGRAM W/LOWER EXTREMITY Left 12/01/2021   Procedure: ABDOMINAL AORTOGRAM W/LOWER EXTREMITY;  Surgeon: Cephus Shelling, MD;  Location: [facility] INVASIVE CV LAB;  Service: Cardiovascular;  Laterality: Left;   CAROTID ENDARTERECTOMY     CORONARY  ANGIOPLASTY WITH STENT PLACEMENT     LEFT HEART CATHETERIZATION WITH CORONARY ANGIOGRAM N/A 06/08/2014   Procedure: LEFT HEART CATHETERIZATION WITH CORONARY ANGIOGRAM;  Surgeon: Micheline Chapman, MD;  Location: [facility] CATH LAB;  Service: Cardiovascular;  Laterality: N/A;   LEFT HEART CATHETERIZATION WITH CORONARY ANGIOGRAM N/A 10/06/2014   Procedure: LEFT HEART CATHETERIZATION WITH CORONARY ANGIOGRAM;  Surgeon: Marykay Lex, MD;  Location: [facility] CATH LAB;  Service: Cardiovascular;  Laterality: N/A;   PERIPHERAL VASCULAR INTERVENTION Left 12/01/2021   Procedure: PERIPHERAL VASCULAR INTERVENTION;  Surgeon: Cephus Shelling, MD;  Location: [facility] INVASIVE CV LAB;  Service: Cardiovascular;  Laterality: Left;  SFA    ROS:   General:  No weight loss, Fever, chills  HEENT: No recent headaches, no nasal bleeding, no visual changes, no sore throat  Neurologic: No dizziness, blackouts, seizures. No recent symptoms of stroke or mini- stroke. No recent episodes of slurred speech, or temporary blindness.  Cardiac: No recent episodes of chest pain/pressure, no shortness of breath at rest.  No shortness of breath with exertion.  Denies history of atrial fibrillation or irregular heartbeat  Vascular: No history of rest pain in feet.  No history of claudication.  No history of non-healing ulcer, No history of DVT   Pulmonary: No home oxygen, no productive cough, no hemoptysis,  No asthma or wheezing  Musculoskeletal:  [ ]  Arthritis, [ ]  Low back pain,  [ ]  Joint pain  Hematologic:No history of hypercoagulable state.  No history of easy bleeding.  No history of anemia  Gastrointestinal: No hematochezia or melena,  No gastroesophageal reflux, no trouble swallowing  Urinary: [ ]  chronic Kidney disease, [ ]  on HD - [ ]  MWF or [ ]  TTHS, [ ]  Burning with urination, [ ]  Frequent urination, [ ]  Difficulty urinating;   Skin: No rashes  Psychological: No history of anxiety,  No history of depression  Social  History Social History   Tobacco Use   Smoking status: Former    Current packs/day: 0.00    Types: Cigarettes    Quit date: 06/05/2012    Years since quitting: 11.2   Smokeless tobacco: Never  Vaping Use   Vaping status: Never Used  Substance Use Topics   Alcohol use: No   Drug use: No    Family History Family History  Problem Relation Age of Onset   Heart attack Father 9   COPD Mother 52    Allergies  Allergies  Allergen Reactions   Jardiance [Empagliflozin]     Other reaction(s): urine pain   Lescol [Fluvastatin] Other (See Comments)   Lipitor [Atorvastatin]     Other reaction(s): Muscle pain   Metformin Hcl     Other reaction(s): stomach upset   Other     Anesthesia-temporary paralysis    Zocor [Simvastatin]     Other reaction(s): Muscle pain   Penicillins Rash    Has patient had a PCN reaction causing immediate rash, facial/tongue/throat swelling, SOB or lightheadedness with hypotension: Yes Has patient had a PCN reaction causing severe rash involving mucus membranes or skin necrosis: No Has patient had a PCN reaction that required hospitalization: No Has patient had a PCN reaction occurring within the last 10 years: No If all of the above answers are "NO", then may proceed with Cephalosporin use.      Current Outpatient Medications  Medication Sig Dispense Refill   aspirin EC 81 MG tablet Take 81 mg by mouth daily.     atenolol (TENORMIN) 50 MG tablet Take 1 tablet (50 mg total) by mouth daily. 90 tablet 3   CALCIUM PO Take 1 tablet by mouth daily.     canagliflozin (INVOKANA) 300 MG TABS tablet Take 1 tablet (300 mg total) by mouth daily before the first meal of the day. 90 tablet 2   cholecalciferol (VITAMIN D) 25 MCG (1000 UNIT) tablet Take 1,000 Units by mouth daily.     clopidogrel (PLAVIX) 75 MG tablet Take 1 tablet (75 mg total) by mouth daily. 90 tablet 3   furosemide (LASIX) 20 MG tablet Take 1 tablet (20 mg total) by mouth daily. 90 tablet 3    glipiZIDE (GLUCOTROL XL) 10 MG 24 hr tablet Take 1 tablet (10 mg total) by mouth daily with breakfast. 90 tablet 0   isosorbide mononitrate (IMDUR) 60 MG 24 hr tablet Pt takes 2 tablet 120 mg once a day. 180 tablet 2   latanoprost (XALATAN) 0.005 % ophthalmic solution Place 1 drop into both eyes at bedtime. 7.5 mL 10   Multiple Vitamin (MULTIVITAMIN WITH MINERALS) TABS tablet Take 1 tablet by mouth daily.     nitroGLYCERIN (NITROSTAT) 0.4 MG SL tablet Place 1 tablet (0.4 mg total) every 5 (five) minutes x 3 doses as needed under the tongue for chest pain. 90 tablet 3   Omega-3  Fatty Acids (OMEGA 3 PO) Take 3 capsules by mouth in the morning. OmegaXL Joint Pain Relief & Inflammation Supplement     pantoprazole (PROTONIX) 40 MG tablet Take 1 tablet (40 mg total) by mouth daily before breakfast. 90 tablet 1   rosuvastatin (CRESTOR) 20 MG tablet TAKE 1 TABLET EVERY DAY 90 tablet 3   VITAMIN E PO Take 1 capsule by mouth in the morning.     No current facility-administered medications for this visit.    Physical Examination  Vitals:   09/11/23 1039  BP: (!) 171/71  Pulse: 89  Resp: 18  Temp: 97.9 F (36.6 C)  TempSrc: Temporal  SpO2: 97%  Weight: 214 lb 1.6 oz (97.1 kg)  Height: 5\' 8"  (1.727 m)    Body mass index is 32.55 kg/m.  General:  Alert and oriented, no acute distress HEENT: Normal Neck: No bruit or JVD Pulmonary: Clear to auscultation bilaterally Cardiac: Regular Rate and Rhythm without murmur Abdomen: Soft, non-tender, non-distended, no mass, no scars Skin: No rash Extremity Pulses: non palpable pedal pulses Musculoskeletal: minimal edema B LE Neurologic: Upper and lower extremity motor 5/5 and symmetric  DATA:  +----------+--------+-----+--------+--------+--------+  LEFT     PSV cm/sRatioStenosisWaveformComments  +----------+--------+-----+--------+--------+--------+  CFA Distal139                  biphasic           +----------+--------+-----+--------+--------+--------+  DFA      201                  biphasic          +----------+--------+-----+--------+--------+--------+  SFA Prox  125                                    +----------+--------+-----+--------+--------+--------+  POP Mid   104                  biphasic          +----------+--------+-----+--------+--------+--------+     Left Stent(s):  +---------------+--------+--------+--------+--------+  SFA           PSV cm/sStenosisWaveformComments  +---------------+--------+--------+--------+--------+  Prox to Stent  125                               +---------------+--------+--------+--------+--------+  Proximal Stent 140                               +---------------+--------+--------+--------+--------+  Mid Stent      99                                +---------------+--------+--------+--------+--------+  Distal Stent   62                                +---------------+--------+--------+--------+--------+  Distal to Stent95                                +---------------+--------+--------+--------+--------+      Summary:  Left: Patent superficial femoral artery stent where visualized. The distal  portion of the stent is difficult to visualize.     ABI Findings:  +---------+------------------+-----+--------+--------+  Right   Rt Pressure (mmHg)IndexWaveformComment   +---------+------------------+-----+--------+--------+  Brachial 147                                      +---------+------------------+-----+--------+--------+  PTA     215               1.46 biphasic          +---------+------------------+-----+--------+--------+  DP      213               1.45 biphasic          +---------+------------------+-----+--------+--------+  Great Toe114               0.78                   +---------+------------------+-----+--------+--------+    +---------+------------------+-----+--------+-------+  Left    Lt Pressure (mmHg)IndexWaveformComment  +---------+------------------+-----+--------+-------+  Brachial 147                                     +---------+------------------+-----+--------+-------+  PTA     180               1.22 biphasic         +---------+------------------+-----+--------+-------+  DP      195               1.33 biphasic         +---------+------------------+-----+--------+-------+  Great Toe114               0.78                  +---------+------------------+-----+--------+-------+   +-------+-----------+-----------+------------+------------+  ABI/TBIToday's ABIToday's TBIPrevious ABIPrevious TBI  +-------+-----------+-----------+------------+------------+  Right 1.46 [place]    0.78       1.28        0.97          +-------+-----------+-----------+------------+------------+  Left  1.33 [place]    0.78       1.17        1.14          +-------+-----------+-----------+------------+------------+       Summary:  Right: Resting right ankle-brachial index indicates noncompressible right  lower extremity arteries. The right toe-brachial index is normal.   Left: Resting left ankle-brachial index indicates noncompressible left  lower extremity arteries. The left toe-brachial index is normal.    ASSESSMENT/PLAN:  PAD with history of lifestyle limiting claudication His duplex and ABI's are stable and unchanged.  He has calcified arteries with falsley elevated ABI's.  I advised him to restart a walking program and the gym, elevation with rest for mild edema.  If he develops symptoms of ischemia he will call our office otherwise he will follow up for surveillance in 1 year.      Mosetta Pigeon PA-C [facility] [place] Office: [phone]  MD in clinic [place]

## 2023-11-22 DIAGNOSIS — H401131 Primary open-angle glaucoma, bilateral, mild stage: Secondary | ICD-10-CM | POA: Diagnosis not present

## 2023-12-31 DIAGNOSIS — L821 Other seborrheic keratosis: Secondary | ICD-10-CM | POA: Diagnosis not present

## 2023-12-31 DIAGNOSIS — L814 Other melanin hyperpigmentation: Secondary | ICD-10-CM | POA: Diagnosis not present

## 2023-12-31 DIAGNOSIS — Z85828 Personal history of other malignant neoplasm of skin: Secondary | ICD-10-CM | POA: Diagnosis not present

## 2023-12-31 DIAGNOSIS — L218 Other seborrheic dermatitis: Secondary | ICD-10-CM | POA: Diagnosis not present

## 2023-12-31 DIAGNOSIS — L570 Actinic keratosis: Secondary | ICD-10-CM | POA: Diagnosis not present

## 2024-01-05 ENCOUNTER — Other Ambulatory Visit: Payer: Self-pay | Admitting: Physician Assistant

## 2024-01-07 DIAGNOSIS — E1169 Type 2 diabetes mellitus with other specified complication: Secondary | ICD-10-CM | POA: Diagnosis not present

## 2024-01-07 DIAGNOSIS — E7800 Pure hypercholesterolemia, unspecified: Secondary | ICD-10-CM | POA: Diagnosis not present

## 2024-01-07 DIAGNOSIS — I10 Essential (primary) hypertension: Secondary | ICD-10-CM | POA: Diagnosis not present

## 2024-01-07 DIAGNOSIS — Z0000 Encounter for general adult medical examination without abnormal findings: Secondary | ICD-10-CM | POA: Diagnosis not present

## 2024-01-07 DIAGNOSIS — I2510 Atherosclerotic heart disease of native coronary artery without angina pectoris: Secondary | ICD-10-CM | POA: Diagnosis not present

## 2024-01-07 DIAGNOSIS — Z1331 Encounter for screening for depression: Secondary | ICD-10-CM | POA: Diagnosis not present

## 2024-01-07 DIAGNOSIS — D126 Benign neoplasm of colon, unspecified: Secondary | ICD-10-CM | POA: Diagnosis not present

## 2024-01-07 DIAGNOSIS — I739 Peripheral vascular disease, unspecified: Secondary | ICD-10-CM | POA: Diagnosis not present

## 2024-01-07 DIAGNOSIS — Z79899 Other long term (current) drug therapy: Secondary | ICD-10-CM | POA: Diagnosis not present

## 2024-01-07 DIAGNOSIS — E1151 Type 2 diabetes mellitus with diabetic peripheral angiopathy without gangrene: Secondary | ICD-10-CM | POA: Diagnosis not present

## 2024-01-07 DIAGNOSIS — Z125 Encounter for screening for malignant neoplasm of prostate: Secondary | ICD-10-CM | POA: Diagnosis not present

## 2024-02-07 DIAGNOSIS — L089 Local infection of the skin and subcutaneous tissue, unspecified: Secondary | ICD-10-CM | POA: Diagnosis not present

## 2024-02-15 ENCOUNTER — Other Ambulatory Visit (HOSPITAL_COMMUNITY): Payer: Self-pay

## 2024-02-15 DIAGNOSIS — H401122 Primary open-angle glaucoma, left eye, moderate stage: Secondary | ICD-10-CM | POA: Diagnosis not present

## 2024-02-15 DIAGNOSIS — H524 Presbyopia: Secondary | ICD-10-CM | POA: Diagnosis not present

## 2024-02-15 MED ORDER — LATANOPROST 0.005 % OP SOLN: 1.0000 [drp] | Freq: Every day | OPHTHALMIC | 10 refills | Status: AC

## 2024-03-19 DIAGNOSIS — E1169 Type 2 diabetes mellitus with other specified complication: Secondary | ICD-10-CM | POA: Diagnosis not present

## 2024-03-21 ENCOUNTER — Other Ambulatory Visit: Payer: Self-pay | Admitting: Physician Assistant

## 2024-03-25 MED ORDER — CLOPIDOGREL BISULFATE 75 MG PO TABS: 75.0000 mg | ORAL_TABLET | Freq: Every day | ORAL | 0 refills | Status: DC

## 2024-03-25 MED ORDER — ATENOLOL 50 MG PO TABS: 50.0000 mg | ORAL_TABLET | Freq: Every day | ORAL | 0 refills | Status: DC

## 2024-03-25 MED ORDER — FUROSEMIDE 20 MG PO TABS: 20.0000 mg | ORAL_TABLET | Freq: Every day | ORAL | 0 refills | Status: DC

## 2024-03-28 DIAGNOSIS — H401131 Primary open-angle glaucoma, bilateral, mild stage: Secondary | ICD-10-CM | POA: Diagnosis not present

## 2024-03-28 DIAGNOSIS — H04123 Dry eye syndrome of bilateral lacrimal glands: Secondary | ICD-10-CM | POA: Diagnosis not present

## 2024-05-07 DIAGNOSIS — E1169 Type 2 diabetes mellitus with other specified complication: Secondary | ICD-10-CM | POA: Diagnosis not present

## 2024-05-07 DIAGNOSIS — M24542 Contracture, left hand: Secondary | ICD-10-CM | POA: Diagnosis not present

## 2024-05-07 DIAGNOSIS — I10 Essential (primary) hypertension: Secondary | ICD-10-CM | POA: Diagnosis not present

## 2024-05-14 ENCOUNTER — Other Ambulatory Visit: Payer: Self-pay | Admitting: Cardiovascular Disease

## 2024-06-05 ENCOUNTER — Other Ambulatory Visit: Payer: Self-pay | Admitting: Cardiovascular Disease

## 2024-06-16 ENCOUNTER — Encounter: Payer: Self-pay | Admitting: Cardiovascular Disease

## 2024-06-16 ENCOUNTER — Ambulatory Visit: Attending: Cardiovascular Disease | Admitting: Cardiovascular Disease

## 2024-06-16 VITALS — BP 118/64 | HR 71 | Ht 68.0 in | Wt 209.0 lb

## 2024-06-16 DIAGNOSIS — E782 Mixed hyperlipidemia: Secondary | ICD-10-CM | POA: Diagnosis not present

## 2024-06-16 DIAGNOSIS — I739 Peripheral vascular disease, unspecified: Secondary | ICD-10-CM

## 2024-06-16 DIAGNOSIS — Z9861 Coronary angioplasty status: Secondary | ICD-10-CM | POA: Diagnosis not present

## 2024-06-16 DIAGNOSIS — I10 Essential (primary) hypertension: Secondary | ICD-10-CM | POA: Diagnosis not present

## 2024-06-16 DIAGNOSIS — I6521 Occlusion and stenosis of right carotid artery: Secondary | ICD-10-CM | POA: Diagnosis not present

## 2024-06-16 DIAGNOSIS — I70212 Atherosclerosis of native arteries of extremities with intermittent claudication, left leg: Secondary | ICD-10-CM | POA: Diagnosis not present

## 2024-06-16 DIAGNOSIS — I2510 Atherosclerotic heart disease of native coronary artery without angina pectoris: Secondary | ICD-10-CM

## 2024-06-16 DIAGNOSIS — I712 Thoracic aortic aneurysm, without rupture, unspecified: Secondary | ICD-10-CM | POA: Insufficient documentation

## 2024-06-16 DIAGNOSIS — I7121 Aneurysm of the ascending aorta, without rupture: Secondary | ICD-10-CM

## 2024-06-16 MED ORDER — ATENOLOL 50 MG PO TABS: 50.0000 mg | ORAL_TABLET | Freq: Every day | ORAL | 3 refills | Status: AC

## 2024-06-16 MED ORDER — ATENOLOL 50 MG PO TABS: 50.0000 mg | ORAL_TABLET | Freq: Every day | ORAL | 3 refills | Status: DC

## 2024-06-16 NOTE — Assessment & Plan Note (Signed)
 History of PAD status post left SFA stenting with Eluvia drug-eluting stent by Dr. Gretta 12/02/2021 which he follows.  The patient denies claudication.

## 2024-06-16 NOTE — Assessment & Plan Note (Signed)
 History of hyperlipidemia on rosuvastatin  with lipid profile performed 12/30/2023 revealing total cholesterol 147, LDL 78 and HDL 39.

## 2024-06-16 NOTE — Assessment & Plan Note (Signed)
 History of essential hypertension blood pressure measured today at 118/64.  He is on atenolol .

## 2024-06-16 NOTE — Progress Notes (Signed)
 "     06/16/2024 Reginald Little   Oct 27, 1950  [id number]  Primary Physician Dwight Trula SQUIBB, MD Primary Cardiologist: Dorn JINNY Lesches MD FACP, [place], [place], [place]  HPI:  Reginald Little is a 74 y.o.  mildly overweight divorced Caucasian male with no children whose ex-wife apparently passed away back in 07/17/2017.  He has a history of CAD status post stenting of his coronary arteries in 2011/07/18 at [facility] using drug-eluting stents.I last saw him in the office 06/07/2018.His history is also remarkable for remote tobacco abuse, treated hypertension and hyperlipidemia. He had a left carotid endarterectomy performed at the [facility] in [place] number of years ago as well. He was admitted to the hospital in December with chest pain. A Myoview  stress test was high risk and he underwent cardiac catheterization by Dr. Wonda stenting of PLA 1 and 2 as well as OM 2 using drug-eluting stents. He had some chest pain since which is improved with the addition of long-acting oral nitrate. He does complain of left hip claudication as well. He was admitted to the hospital for observation overnight 10/21/14 because of witnessed syncope while chronic rehabilitation. His nitrates were decreased in dose. He did have lower extremity artery Doppler studies which were normal ruling out peripheral vascular disease as a cause of his left hip pain. He was seen in emergency room and dilated by Dr. Leigh T, and his pain quickly resolved with supplemental glycerin. He was seen by Scot Ford PAC in the office 01/15/2018 and was doing well.   Since I saw him 7 years ago he did undergo left SFA stenting by Dr. Gretta in 2021/07/17 which he follows.  He currently denies chest pain or shortness of breath or claudication.   Active Medications[1]   Allergies[2]  Social History   Socioeconomic History   Marital status: Divorced    Spouse name: Not on file   Number of children: Not on file   Years of education: Not on file    Highest education level: Not on file  Occupational History   Occupation: Owns transportation brokering business  Tobacco Use   Smoking status: Former    Current packs/day: 0.00    Types: Cigarettes    Quit date: 06/05/2012    Years since quitting: 12.0   Smokeless tobacco: Never  Vaping Use   Vaping status: Never Used  Substance and Sexual Activity   Alcohol use: No   Drug use: No   Sexual activity: Not on file  Other Topics Concern   Not on file  Social History Narrative   Lives in [place] by himself.  Family and friends nearby.  He is a Administrator, [employer].     Social Drivers of Health   Tobacco Use: Medium Risk (09/11/2023)   Patient History    Smoking Tobacco Use: Former    Smokeless Tobacco Use: Never    Passive Exposure: Not on [place] Strain: Not on file  Food Insecurity: Not on file  Transportation Needs: Not on file  Physical Activity: Not on file  Stress: Not on file  Social Connections: Not on file  Intimate Partner Violence: Not on file  Depression (EYV7-0): Not on file  Alcohol Screen: Not on file  Housing: Not on file  Utilities: Not on file  Health Literacy: Not on file     Review of Systems: General: negative for chills, fever, night sweats or weight changes.  Cardiovascular: negative for chest pain, dyspnea on exertion, edema,  orthopnea, palpitations, paroxysmal nocturnal dyspnea or shortness of breath Dermatological: negative for rash Respiratory: negative for cough or wheezing Urologic: negative for hematuria Abdominal: negative for nausea, vomiting, diarrhea, bright red blood per rectum, melena, or hematemesis Neurologic: negative for visual changes, syncope, or dizziness All other systems reviewed and are otherwise negative except as noted above.    Blood pressure 118/64, pulse 71, height 5' 8 (1.727 m), weight 209 lb (94.8 kg), SpO2 96%.  General appearance: alert and no distress Neck: no adenopathy, no carotid bruit, no JVD, supple,  symmetrical, trachea midline, and thyroid not enlarged, symmetric, no tenderness/mass/nodules Lungs: clear to auscultation bilaterally Heart: regular rate and rhythm, S1, S2 normal, no murmur, click, rub or gallop Extremities: extremities normal, atraumatic, no cyanosis or edema Pulses: 2+ and symmetric Skin: Skin color, texture, turgor normal. No rashes or lesions Neurologic: Grossly normal  EKG EKG Interpretation Date/Time:  Monday June 16 2024 14:22:12 EST Ventricular Rate:  71 PR Interval:  168 QRS Duration:  88 QT Interval:  422 QTC Calculation: 458 R Axis:   27  Text Interpretation: Normal sinus rhythm Normal ECG When compared with ECG of 31-May-2023 16:15, Minimal criteria for Inferior infarct are no longer Present T wave inversion no longer evident in Inferior leads Confirmed by Court Carrier [phone]) on 06/16/2024 2:26:00 PM    ASSESSMENT AND PLAN:   Essential hypertension History of essential hypertension blood pressure measured today at 118/64.  He is on atenolol .  Hyperlipidemia History of hyperlipidemia on rosuvastatin  with lipid profile performed 12/30/2023 revealing total cholesterol 147, LDL 78 and HDL 39.  Carotid artery disease (HCC) History of carotid artery disease status post left carotid endarterectomy performed at the [facility] in [place] remotely.  His last carotid Doppler studies performed 04/09/2020 revealed moderate right ICA stenosis with a patent left and carotid endarterectomy site.  We will repeat carotid Doppler studies.  Claudication History of PAD status post left SFA stenting with Eluvia drug-eluting stent by Dr. Gretta 12/02/2021 which he follows.  The patient denies claudication.  CAD S/P percutaneous coronary angioplasty History of CAD status post multiple stents in the past beginning in 2013 to his RCA, OM and PLA branches.  Dr. Wonda intervened in 2015 on PLA 1 and OM 2.  He is on a high dose long-acting nitrate and denies chest  pain.  Thoracic aortic aneurysm 2D echocardiogram performed 01/24/2018 revealing an ascending thoracic aorta measuring 41 mm.  This will be repeated.     Carrier DOROTHA Court MD FACP,FACC,FAHA, FSCAI 06/16/2024 2:41 PM    [1]  Current Meds  Medication Sig   aspirin  EC 81 MG tablet Take 81 mg by mouth daily.   CALCIUM  PO Take 1 tablet by mouth daily.   canagliflozin  (INVOKANA ) 300 MG TABS tablet Take 1 tablet (300 mg total) by mouth daily before the first meal of the day.   cholecalciferol  (VITAMIN D) 25 MCG (1000 UNIT) tablet Take 1,000 Units by mouth daily.   clopidogrel  (PLAVIX ) 75 MG tablet Take 1 tablet (75 mg total) by mouth daily. Pt must keep upcoming followup appt with Cardiology in January 2026 for any more refills. Thank You   furosemide  (LASIX ) 20 MG tablet Take 1 tablet (20 mg total) by mouth daily.   glipiZIDE  (GLUCOTROL  XL) 10 MG 24 hr tablet Take 1 tablet (10 mg total) by mouth daily with breakfast.   isosorbide  mononitrate (IMDUR ) 60 MG 24 hr tablet TAKE 2 TABLETS ONE TIME DAILY   latanoprost  (XALATAN ) 0.005 %  ophthalmic solution Place 1 drop into both eyes at bedtime.   latanoprost  (XALATAN ) 0.005 % ophthalmic solution INSTILL 1 DROP INTO BOTH EYES AT BEDTIME   Multiple Vitamin (MULTIVITAMIN WITH MINERALS) TABS tablet Take 1 tablet by mouth daily.   nitroGLYCERIN  (NITROSTAT ) 0.4 MG SL tablet Place 1 tablet (0.4 mg total) every 5 (five) minutes x 3 doses as needed under the tongue for chest pain.   Omega-3 Fatty Acids (OMEGA 3 PO) Take 3 capsules by mouth in the morning. OmegaXL Joint Pain Relief & Inflammation Supplement   pantoprazole  (PROTONIX ) 40 MG tablet Take 1 tablet (40 mg total) by mouth daily before breakfast.   rosuvastatin  (CRESTOR ) 20 MG tablet TAKE 1 TABLET EVERY DAY   VITAMIN E  PO Take 1 capsule by mouth in the morning.   [DISCONTINUED] atenolol  (TENORMIN ) 50 MG tablet Take 1 tablet (50 mg total) by mouth daily. Pt must keep upcoming followup appt with  Cardiology in January 2026 for any more refills. Thank You  [2]  Allergies Allergen Reactions   Jardiance [Empagliflozin]     Other reaction(s): urine pain   Lescol [Fluvastatin] Other (See Comments)   Lipitor [Atorvastatin ]     Other reaction(s): Muscle pain   Metformin Hcl     Other reaction(s): stomach upset   Other     Anesthesia-temporary paralysis    Zocor [Simvastatin]     Other reaction(s): Muscle pain   Penicillins Rash    Has patient had a PCN reaction causing immediate rash, facial/tongue/throat swelling, SOB or lightheadedness with hypotension: Yes Has patient had a PCN reaction causing severe rash involving mucus membranes or skin necrosis: No Has patient had a PCN reaction that required hospitalization: No Has patient had a PCN reaction occurring within the last 10 years: No If all of the above answers are NO, then may proceed with Cephalosporin use.    "

## 2024-06-16 NOTE — Assessment & Plan Note (Signed)
 2D echocardiogram performed 01/24/2018 revealing an ascending thoracic aorta measuring 41 mm.  This will be repeated.

## 2024-06-16 NOTE — Assessment & Plan Note (Signed)
 History of CAD status post multiple stents in the past beginning in 2013 to his RCA, OM and PLA branches.  Dr. Wonda intervened in 2015 on PLA 1 and OM 2.  He is on a high dose long-acting nitrate and denies chest pain.

## 2024-06-16 NOTE — Assessment & Plan Note (Signed)
 History of carotid artery disease status post left carotid endarterectomy performed at the [facility] in [place] remotely.  His last carotid Doppler studies performed 04/09/2020 revealed moderate right ICA stenosis with a patent left and carotid endarterectomy site.  We will repeat carotid Doppler studies.

## 2024-06-16 NOTE — Patient Instructions (Signed)
 Medication Instructions:  Your physician recommends that you continue on your current medications as directed. Please refer to the Current Medication list given to you today.  *If you need a refill on your cardiac medications before your next appointment, please call your pharmacy*  Testing/Procedures: Your physician has requested that you have a carotid duplex. This test is an ultrasound of the carotid arteries in your neck. It looks at blood flow through these arteries that supply the brain with blood. Allow one hour for this exam. There are no restrictions or special instructions. This will take place at [address] 4th floor  Please note: We ask at that you not bring children with you during ultrasound (echo/ vascular) testing. Due to room size and safety concerns, children are not allowed in the ultrasound rooms during exams. Our front office staff cannot provide observation of children in our lobby area while testing is being conducted. An adult accompanying a patient to their appointment will only be allowed in the ultrasound room at the discretion of the ultrasound technician under special circumstances. We apologize for any inconvenience.   Your physician has requested that you have an echocardiogram. Echocardiography is a painless test that uses sound waves to create images of your heart. It provides your doctor with information about the size and shape of your heart and how well your hearts chambers and valves are working. This procedure takes approximately one hour. There are no restrictions for this procedure. Please do NOT wear cologne, perfume, aftershave, or lotions (deodorant is allowed). Please arrive 15 minutes prior to your appointment time.  Please note: We ask at that you not bring children with you during ultrasound (echo/ vascular) testing. Due to room size and safety concerns, children are not allowed in the ultrasound rooms during exams. Our front office staff cannot  provide observation of children in our lobby area while testing is being conducted. An adult accompanying a patient to their appointment will only be allowed in the ultrasound room at the discretion of the ultrasound technician under special circumstances. We apologize for any inconvenience.   Follow-Up: At [facility], you and your health needs are our priority.  As part of our continuing mission to provide you with exceptional heart care, our providers are all part of one team.  This team includes your primary Cardiologist (physician) and Advanced Practice Providers or APPs (Physician Assistants and Nurse Practitioners) who all work together to provide you with the care you need, when you need it.  Your next appointment:   12 month(s)  Provider:   Hao Meng, PA-C         Then, Dorn Lesches, MD will plan to see you again in 2 year(s).

## 2024-06-26 ENCOUNTER — Ambulatory Visit (HOSPITAL_COMMUNITY): Admission: RE | Admit: 2024-06-26 | Source: Ambulatory Visit

## 2024-06-26 ENCOUNTER — Ambulatory Visit (HOSPITAL_COMMUNITY)

## 2024-07-23 ENCOUNTER — Ambulatory Visit (HOSPITAL_COMMUNITY)

## 2024-07-23 ENCOUNTER — Ambulatory Visit (HOSPITAL_COMMUNITY): Admission: RE | Admit: 2024-07-23 | Source: Ambulatory Visit
